# Patient Record
Sex: Male | Born: 1955 | Race: White | Hispanic: No | Smoking: Never smoker
Health system: Southern US, Community
[De-identification: ages and names within clinical notes are randomized; demographics above are authoritative.]

## PROBLEM LIST (undated history)

## (undated) DIAGNOSIS — G459 Transient cerebral ischemic attack, unspecified: Secondary | ICD-10-CM

## (undated) DIAGNOSIS — H269 Unspecified cataract: Secondary | ICD-10-CM

## (undated) DIAGNOSIS — G8929 Other chronic pain: Secondary | ICD-10-CM

## (undated) DIAGNOSIS — Q984 Klinefelter syndrome, unspecified: Secondary | ICD-10-CM

## (undated) DIAGNOSIS — T7840XA Allergy, unspecified, initial encounter: Secondary | ICD-10-CM

## (undated) DIAGNOSIS — R519 Headache, unspecified: Secondary | ICD-10-CM

## (undated) DIAGNOSIS — F410 Panic disorder [episodic paroxysmal anxiety] without agoraphobia: Secondary | ICD-10-CM

## (undated) DIAGNOSIS — K219 Gastro-esophageal reflux disease without esophagitis: Secondary | ICD-10-CM

## (undated) DIAGNOSIS — E785 Hyperlipidemia, unspecified: Secondary | ICD-10-CM

## (undated) DIAGNOSIS — J302 Other seasonal allergic rhinitis: Secondary | ICD-10-CM

## (undated) DIAGNOSIS — R51 Headache: Secondary | ICD-10-CM

## (undated) DIAGNOSIS — K200 Eosinophilic esophagitis: Secondary | ICD-10-CM

## (undated) DIAGNOSIS — D689 Coagulation defect, unspecified: Secondary | ICD-10-CM

## (undated) DIAGNOSIS — I2699 Other pulmonary embolism without acute cor pulmonale: Secondary | ICD-10-CM

## (undated) DIAGNOSIS — M47812 Spondylosis without myelopathy or radiculopathy, cervical region: Secondary | ICD-10-CM

## (undated) HISTORY — DX: Other pulmonary embolism without acute cor pulmonale: I26.99

## (undated) HISTORY — DX: Headache: R51

## (undated) HISTORY — DX: Transient cerebral ischemic attack, unspecified: G45.9

## (undated) HISTORY — DX: Eosinophilic esophagitis: K20.0

## (undated) HISTORY — DX: Other seasonal allergic rhinitis: J30.2

## (undated) HISTORY — PX: TONSILLECTOMY: SUR1361

## (undated) HISTORY — DX: Hyperlipidemia, unspecified: E78.5

## (undated) HISTORY — DX: Headache, unspecified: R51.9

## (undated) HISTORY — PX: CHOLECYSTECTOMY: SHX55

## (undated) HISTORY — PX: COLONOSCOPY: SHX174

## (undated) HISTORY — DX: Spondylosis without myelopathy or radiculopathy, cervical region: M47.812

## (undated) HISTORY — DX: Allergy, unspecified, initial encounter: T78.40XA

## (undated) HISTORY — PX: UPPER GASTROINTESTINAL ENDOSCOPY: SHX188

## (undated) HISTORY — PX: OTHER SURGICAL HISTORY: SHX169

## (undated) HISTORY — DX: Unspecified cataract: H26.9

## (undated) HISTORY — DX: Other chronic pain: G89.29

## (undated) HISTORY — DX: Coagulation defect, unspecified: D68.9

## (undated) HISTORY — PX: HAND TENDON SURGERY: SHX663

## (undated) HISTORY — PX: ERCP: SHX60

---

## 2002-02-13 ENCOUNTER — Encounter: Payer: Self-pay | Admitting: Internal Medicine

## 2002-02-13 ENCOUNTER — Emergency Department (HOSPITAL_COMMUNITY): Admission: EM | Admit: 2002-02-13 | Discharge: 2002-02-13 | Payer: Self-pay | Admitting: Internal Medicine

## 2002-09-26 ENCOUNTER — Encounter: Payer: Self-pay | Admitting: Internal Medicine

## 2007-07-08 ENCOUNTER — Ambulatory Visit: Payer: Self-pay | Admitting: Internal Medicine

## 2007-07-12 ENCOUNTER — Ambulatory Visit: Payer: Self-pay | Admitting: Internal Medicine

## 2008-05-22 ENCOUNTER — Ambulatory Visit: Payer: Self-pay | Admitting: Internal Medicine

## 2008-05-22 DIAGNOSIS — R1319 Other dysphagia: Secondary | ICD-10-CM | POA: Insufficient documentation

## 2008-05-30 ENCOUNTER — Ambulatory Visit: Payer: Self-pay | Admitting: Internal Medicine

## 2008-05-30 ENCOUNTER — Encounter: Payer: Self-pay | Admitting: Internal Medicine

## 2008-05-30 ENCOUNTER — Ambulatory Visit (HOSPITAL_COMMUNITY): Admission: RE | Admit: 2008-05-30 | Discharge: 2008-05-30 | Payer: Self-pay | Admitting: Internal Medicine

## 2008-06-01 ENCOUNTER — Emergency Department (HOSPITAL_COMMUNITY): Admission: EM | Admit: 2008-06-01 | Discharge: 2008-06-01 | Payer: Self-pay | Admitting: Emergency Medicine

## 2008-06-01 ENCOUNTER — Encounter: Payer: Self-pay | Admitting: Internal Medicine

## 2008-06-01 ENCOUNTER — Telehealth: Payer: Self-pay | Admitting: Internal Medicine

## 2008-06-02 ENCOUNTER — Encounter (INDEPENDENT_AMBULATORY_CARE_PROVIDER_SITE_OTHER): Payer: Self-pay

## 2008-06-02 ENCOUNTER — Telehealth (INDEPENDENT_AMBULATORY_CARE_PROVIDER_SITE_OTHER): Payer: Self-pay

## 2008-06-03 ENCOUNTER — Telehealth (INDEPENDENT_AMBULATORY_CARE_PROVIDER_SITE_OTHER): Payer: Self-pay

## 2008-06-03 DIAGNOSIS — K200 Eosinophilic esophagitis: Secondary | ICD-10-CM | POA: Insufficient documentation

## 2008-06-03 DIAGNOSIS — T7840XA Allergy, unspecified, initial encounter: Secondary | ICD-10-CM | POA: Insufficient documentation

## 2008-06-26 ENCOUNTER — Telehealth: Payer: Self-pay | Admitting: Internal Medicine

## 2008-08-12 ENCOUNTER — Ambulatory Visit: Payer: Self-pay | Admitting: Internal Medicine

## 2008-08-12 ENCOUNTER — Telehealth: Payer: Self-pay | Admitting: Internal Medicine

## 2008-08-12 DIAGNOSIS — K219 Gastro-esophageal reflux disease without esophagitis: Secondary | ICD-10-CM | POA: Insufficient documentation

## 2008-08-12 DIAGNOSIS — J4520 Mild intermittent asthma, uncomplicated: Secondary | ICD-10-CM | POA: Insufficient documentation

## 2008-08-18 LAB — CONVERTED CEMR LAB
IgE (Immunoglobulin E), Serum: 41.1 intl units/mL (ref 0.0–180.0)
Lymphocytes Relative: 30.7 % (ref 12.0–46.0)
Monocytes Relative: 7.4 % (ref 3.0–12.0)
Platelets: 225 10*3/uL (ref 150–400)
RDW: 12.5 % (ref 11.5–14.6)
WBC: 6.3 10*3/uL (ref 4.5–10.5)

## 2008-09-09 ENCOUNTER — Telehealth: Payer: Self-pay | Admitting: Internal Medicine

## 2008-10-16 ENCOUNTER — Emergency Department (HOSPITAL_COMMUNITY): Admission: EM | Admit: 2008-10-16 | Discharge: 2008-10-16 | Payer: Self-pay | Admitting: Emergency Medicine

## 2008-10-19 ENCOUNTER — Telehealth: Payer: Self-pay | Admitting: Internal Medicine

## 2008-10-22 ENCOUNTER — Telehealth: Payer: Self-pay | Admitting: Internal Medicine

## 2008-10-22 ENCOUNTER — Inpatient Hospital Stay (HOSPITAL_COMMUNITY): Admission: EM | Admit: 2008-10-22 | Discharge: 2008-10-28 | Payer: Self-pay | Admitting: Emergency Medicine

## 2008-10-22 ENCOUNTER — Ambulatory Visit: Payer: Self-pay | Admitting: Gastroenterology

## 2008-10-24 ENCOUNTER — Encounter (INDEPENDENT_AMBULATORY_CARE_PROVIDER_SITE_OTHER): Payer: Self-pay | Admitting: General Surgery

## 2008-10-27 ENCOUNTER — Encounter: Payer: Self-pay | Admitting: Gastroenterology

## 2008-11-16 ENCOUNTER — Encounter: Payer: Self-pay | Admitting: Internal Medicine

## 2008-11-30 ENCOUNTER — Encounter: Payer: Self-pay | Admitting: Internal Medicine

## 2008-12-14 ENCOUNTER — Ambulatory Visit: Payer: Self-pay | Admitting: Internal Medicine

## 2008-12-14 DIAGNOSIS — K8050 Calculus of bile duct without cholangitis or cholecystitis without obstruction: Secondary | ICD-10-CM | POA: Insufficient documentation

## 2008-12-14 DIAGNOSIS — R5383 Other fatigue: Secondary | ICD-10-CM

## 2008-12-14 DIAGNOSIS — R5381 Other malaise: Secondary | ICD-10-CM | POA: Insufficient documentation

## 2008-12-16 LAB — CONVERTED CEMR LAB
Basophils Relative: 3.7 % — ABNORMAL HIGH (ref 0.0–3.0)
CO2: 32 meq/L (ref 19–32)
Eosinophils Relative: 5.4 % — ABNORMAL HIGH (ref 0.0–5.0)
GFR calc non Af Amer: 83.37 mL/min (ref >60)
Glucose, Bld: 96 mg/dL (ref 70–99)
HCT: 42.3 % (ref 39.0–52.0)
Hemoglobin: 15.1 g/dL (ref 13.0–17.0)
Lymphs Abs: 2 10*3/uL (ref 0.7–4.0)
Monocytes Relative: 9 % (ref 3.0–12.0)
Neutro Abs: 3.5 10*3/uL (ref 1.4–7.7)
Platelets: 203 10*3/uL (ref 150.0–400.0)
RBC: 4.79 M/uL (ref 4.22–5.81)
Sodium: 142 meq/L (ref 135–145)
TSH: 2.07 microintl units/mL (ref 0.35–5.50)
Total Bilirubin: 1.3 mg/dL — ABNORMAL HIGH (ref 0.3–1.2)
Total Protein: 6.6 g/dL (ref 6.0–8.3)
WBC: 6.7 10*3/uL (ref 4.5–10.5)

## 2009-06-11 ENCOUNTER — Telehealth (INDEPENDENT_AMBULATORY_CARE_PROVIDER_SITE_OTHER): Payer: Self-pay | Admitting: *Deleted

## 2009-06-30 ENCOUNTER — Ambulatory Visit: Payer: Self-pay | Admitting: Internal Medicine

## 2009-08-05 ENCOUNTER — Telehealth: Payer: Self-pay | Admitting: Internal Medicine

## 2009-12-01 ENCOUNTER — Telehealth: Payer: Self-pay | Admitting: Internal Medicine

## 2010-06-23 ENCOUNTER — Encounter (INDEPENDENT_AMBULATORY_CARE_PROVIDER_SITE_OTHER): Payer: Self-pay | Admitting: *Deleted

## 2010-07-11 ENCOUNTER — Ambulatory Visit: Payer: Self-pay | Admitting: Internal Medicine

## 2010-10-18 NOTE — Progress Notes (Signed)
Summary: f/u appt?  Phone Note Call from Patient Call back at Work Phone [phone]   Caller: Patient Call For: Dr. Leone Payor Reason for Call: Talk to Nurse Summary of Call: pt would like to know if he needs to come in twice per year for f/u's... if not, he will wait until October of this year to come in for an annual f/u... pt states he is not having any problems or issues, this is just to check what Dr. Leone Payor thinks is best Initial call taken by: Vallarie Mare,  December 01, 2009 8:23 AM  Follow-up for Phone Call        1x/year visit with refills for 1 year (next visit due 10/11) are ok as long as wthout significant problems like dysphagia Follow-up by: Iva Boop MD, Clementeen Graham,  December 01, 2009 2:01 PM  Additional Follow-up for Phone Call Additional follow up Details #1::        notified pt OK to follow up in 10/11.  Refills sent to last until then.  11 refills previously sent but pt states that pharmacy told him he was out of refills.  Pt denies any dysphagia or reflux.  Pt will make appt in October. Additional Follow-up by: Francee Piccolo CMA Duncan Dull),  December 01, 2009 3:06 PM    Prescriptions: PROTONIX 40 MG TBEC (PANTOPRAZOLE SODIUM) 1 tablet by mouth once daily  #30.0 Each x 7   Entered by:   Francee Piccolo CMA (AAMA)   Authorized by:   Iva Boop MD, [facility]   Signed by:   Francee Piccolo CMA (AAMA) on 12/01/2009   Method used:   Electronically to        [facility]. [phone]* (retail)       [address]       [place], [place]  [address]       Ph: [phone]       Fax: [phone]   RxID:   [id number]

## 2010-10-18 NOTE — Assessment & Plan Note (Signed)
Summary: YEARLY FOLLOW UP/YF    History of Present Illness Visit Type: Follow-up Visit Primary GI MD: Stan Head MD [facility] Primary Provider: Robert Bellow, MD Requesting Provider: n/a Chief Complaint: GERD, eosinophilic esophagitis follow-up History of Present Illness:   55 yo Antonio [name] followed for GERD and eosinophilic esophagitis. Once every two months her complains of increased reflux that is severe and it last several hours, he relates it to a change in his diet. Patient has a groin fungus and is on cream two times a day  Reflux episodes triggered by spicy foods.    GI Review of Systems      Denies abdominal pain, acid reflux, belching, bloating, chest pain, dysphagia with liquids, dysphagia with solids, heartburn, loss of appetite, nausea, vomiting, vomiting blood, weight loss, and  weight gain.        Denies anal fissure, black tarry stools, change in bowel habit, constipation, diarrhea, diverticulosis, fecal incontinence, heme positive stool, hemorrhoids, irritable bowel syndrome, jaundice, light color stool, liver problems, rectal bleeding, and  rectal pain.    Current Medications (verified): 1)  Multivitamins  Tabs (Multiple Vitamin) .... Take 1 Tablet By Mouth Once A Day 2)  Testosterone Injections .... Im Q 3 Wks 3)  Proair Hfa 108 (90 Base) Mcg/act Aers (Albuterol Sulfate) .Marland Kitchen.. 1 Puff As Needed 4)  Protonix 40 Mg Tbec (Pantoprazole Sodium) .Marland Kitchen.. 1 Tablet By Mouth Once Daily 5)  Singulair 10 Mg Tabs (Montelukast Sodium) .Marland Kitchen.. 1 Tablet By Mouth Once Daily 6)  Aspirin 81 Mg Tbec (Aspirin) .... Take One By Mouth Once Daily 7)  Advil Cold and Sinus .... Take One As Needed 8)  Clotimazle/betamethasone Cream .... Apply Two Times A Day To Groin  Allergies: 1)  ! Codeine 2)  ! 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 tonsillectomy cataract surgery both eyes Cholecystectomy/ERCP for cbd stones  Family History: No FH of Colon Cancer allergy- mother and sister Family History of Irritable Bowel Syndrome: sister  Social History: Married Patient has never smoked. Exposed to 2nd hand smoke in the home. 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  Vital Signs:  Patient profile:   55 year old male Height:      Antonio inches Weight:      165.8 pounds BMI:     23.88 Pulse rate:   80 / minute Pulse rhythm:   regular BP sitting:   118 / 60  (left arm) Cuff size:   regular  Vitals Entered By: Harlow Mares CMA Duncan Dull) (July 11, 2010 9:05 AM)  Physical Exam  General:  Well developed, well nourished, no acute distress.   Impression & Recommendations:  Problem # 1:  EOSINOPHILIC ESOPHAGITIS (ICD-530.13) Assessment Unchanged Doing well on PPI and Singulair. No dysphagia. Will continue this tx. If ok can get refills up to 2 years without office visit.  Problem # 2:  GERD (ICD-530.81) Assessment: Unchanged Doing well. Continue PPI. Refills up to 2 years if ok via phone.  Patient Instructions: 1)  Use antacids like Tums, Rolaids, or Mylanta if you eat spicy foods. 2)  Please schedule a follow-up appointment in 1 year-2 years. May have Protonix refills if doing well up to 2 years (without  an office visit). 3)  Please schedule a follow-up appointment as needed sooner if you have new or worsening symptoms. 4)  Copy sent to : Robert Bellow, MD 5)  The medication list was reviewed and reconciled.  All changed / newly prescribed medications were explained.  A complete medication list was provided to the patient / caregiver. Prescriptions: PROTONIX 40 MG TBEC  (PANTOPRAZOLE SODIUM) 1 tablet by mouth once daily  #30.0 Each x 11   Entered and Authorized by:   Iva Boop MD, [facility]   Signed by:   Iva Boop MD, FACG on 07/11/2010   Method used:   Electronically to        [facility]. [phone]* (retail)       [address]       [place], [place]  [address]       Ph: [phone]       Fax: [phone]   RxID:   [id number]

## 2010-10-18 NOTE — Letter (Signed)
Summary: Office Visit Letter  [facility]  [address] [place], [place] [address]   Phone: [phone]  Fax: [phone]      June 23, 2010 MRN: [id number]   [facility] [address] [place], [place]  [address]   Dear Mr. Grell,   According to our records, it is time for you to schedule a follow-up office visit with [place].   At your convenience, please call [phone] (option #2)to schedule an office visit. If you have any questions, concerns, or feel that this letter is in error, we would appreciate your call.   Sincerely,    Iva Boop, M.D.  [facility] [phone]

## 2011-01-02 LAB — DIFFERENTIAL
Basophils Absolute: 0 10*3/uL (ref 0.0–0.1)
Basophils Relative: 0 % (ref 0–1)
Lymphocytes Relative: 7 % — ABNORMAL LOW (ref 12–46)
Neutro Abs: 8.8 10*3/uL — ABNORMAL HIGH (ref 1.7–7.7)

## 2011-01-02 LAB — URINALYSIS, ROUTINE W REFLEX MICROSCOPIC
Bilirubin Urine: NEGATIVE
Ketones, ur: 40 mg/dL — AB
Nitrite: NEGATIVE
Specific Gravity, Urine: 1.015 (ref 1.005–1.030)
Urobilinogen, UA: 0.2 mg/dL (ref 0.0–1.0)

## 2011-01-02 LAB — LIPASE, BLOOD: Lipase: 22 U/L (ref 11–59)

## 2011-01-02 LAB — COMPREHENSIVE METABOLIC PANEL
BUN: 12 mg/dL (ref 6–23)
CO2: 26 mEq/L (ref 19–32)
Chloride: 101 mEq/L (ref 96–112)
Creatinine, Ser: 1.15 mg/dL (ref 0.4–1.5)
GFR calc non Af Amer: >60 mL/min (ref >60)
Glucose, Bld: 182 mg/dL — ABNORMAL HIGH (ref 70–99)
Total Bilirubin: 1.2 mg/dL (ref 0.3–1.2)

## 2011-01-02 LAB — CBC
HCT: 46.4 % (ref 39.0–52.0)
Hemoglobin: 16.3 g/dL (ref 13.0–17.0)
MCV: 87.6 fL (ref 78.0–100.0)
RBC: 5.3 MIL/uL (ref 4.22–5.81)
WBC: 10 10*3/uL (ref 4.0–10.5)

## 2011-01-02 LAB — URINE CULTURE

## 2011-01-02 LAB — RAPID URINE DRUG SCREEN, HOSP PERFORMED
Cocaine: NOT DETECTED
Opiates: NOT DETECTED

## 2011-01-03 LAB — COMPREHENSIVE METABOLIC PANEL
ALT: 241 U/L — ABNORMAL HIGH (ref 0–53)
ALT: 299 U/L — ABNORMAL HIGH (ref 0–53)
AST: 190 U/L — ABNORMAL HIGH (ref 0–37)
AST: 57 U/L — ABNORMAL HIGH (ref 0–37)
Albumin: 3.1 g/dL — ABNORMAL LOW (ref 3.5–5.2)
Alkaline Phosphatase: 204 U/L — ABNORMAL HIGH (ref 39–117)
Alkaline Phosphatase: 286 U/L — ABNORMAL HIGH (ref 39–117)
Alkaline Phosphatase: 320 U/L — ABNORMAL HIGH (ref 39–117)
BUN: 11 mg/dL (ref 6–23)
CO2: 27 mEq/L (ref 19–32)
CO2: 30 mEq/L (ref 19–32)
CO2: 32 mEq/L (ref 19–32)
Calcium: 8.5 mg/dL (ref 8.4–10.5)
Chloride: 100 mEq/L (ref 96–112)
Chloride: 100 mEq/L (ref 96–112)
Chloride: 101 mEq/L (ref 96–112)
Creatinine, Ser: 1.19 mg/dL (ref 0.4–1.5)
GFR calc Af Amer: >60 mL/min (ref >60)
GFR calc Af Amer: >60 mL/min (ref >60)
GFR calc non Af Amer: >60 mL/min (ref >60)
GFR calc non Af Amer: >60 mL/min (ref >60)
GFR calc non Af Amer: >60 mL/min (ref >60)
Glucose, Bld: 105 mg/dL — ABNORMAL HIGH (ref 70–99)
Glucose, Bld: 133 mg/dL — ABNORMAL HIGH (ref 70–99)
Potassium: 3.9 mEq/L (ref 3.5–5.1)
Potassium: 4 mEq/L (ref 3.5–5.1)
Potassium: 4.7 mEq/L (ref 3.5–5.1)
Sodium: 136 mEq/L (ref 135–145)
Sodium: 137 mEq/L (ref 135–145)
Total Bilirubin: 2.6 mg/dL — ABNORMAL HIGH (ref 0.3–1.2)
Total Bilirubin: 3.2 mg/dL — ABNORMAL HIGH (ref 0.3–1.2)
Total Protein: 6.4 g/dL (ref 6.0–8.3)

## 2011-01-03 LAB — CBC
HCT: 39.3 % (ref 39.0–52.0)
HCT: 39.7 % (ref 39.0–52.0)
HCT: 40.2 % (ref 39.0–52.0)
HCT: 45.7 % (ref 39.0–52.0)
Hemoglobin: 13.1 g/dL (ref 13.0–17.0)
Hemoglobin: 13.8 g/dL (ref 13.0–17.0)
Hemoglobin: 15.7 g/dL (ref 13.0–17.0)
MCHC: 34.5 g/dL (ref 30.0–36.0)
MCHC: 34.6 g/dL (ref 30.0–36.0)
MCV: 89.2 fL (ref 78.0–100.0)
MCV: 89.7 fL (ref 78.0–100.0)
MCV: 89.7 fL (ref 78.0–100.0)
Platelets: 308 10*3/uL (ref 150–400)
Platelets: 378 10*3/uL (ref 150–400)
Platelets: 383 10*3/uL (ref 150–400)
Platelets: 444 10*3/uL — ABNORMAL HIGH (ref 150–400)
RBC: 4.16 MIL/uL — ABNORMAL LOW (ref 4.22–5.81)
RBC: 4.5 MIL/uL (ref 4.22–5.81)
RBC: 4.52 MIL/uL (ref 4.22–5.81)
RDW: 13.3 % (ref 11.5–15.5)
RDW: 13.6 % (ref 11.5–15.5)
RDW: 13.7 % (ref 11.5–15.5)
WBC: 6 10*3/uL (ref 4.0–10.5)
WBC: 6.6 10*3/uL (ref 4.0–10.5)
WBC: 7.2 10*3/uL (ref 4.0–10.5)
WBC: 7.5 10*3/uL (ref 4.0–10.5)
WBC: 8.1 10*3/uL (ref 4.0–10.5)

## 2011-01-03 LAB — LIPASE, BLOOD
Lipase: 22 U/L (ref 11–59)
Lipase: 29 U/L (ref 11–59)

## 2011-01-03 LAB — HEPATIC FUNCTION PANEL
ALT: 260 U/L — ABNORMAL HIGH (ref 0–53)
ALT: 597 U/L — ABNORMAL HIGH (ref 0–53)
AST: 77 U/L — ABNORMAL HIGH (ref 0–37)
AST: 91 U/L — ABNORMAL HIGH (ref 0–37)
AST: 98 U/L — ABNORMAL HIGH (ref 0–37)
Albumin: 2.6 g/dL — ABNORMAL LOW (ref 3.5–5.2)
Albumin: 2.7 g/dL — ABNORMAL LOW (ref 3.5–5.2)
Alkaline Phosphatase: 302 U/L — ABNORMAL HIGH (ref 39–117)
Bilirubin, Direct: 0.4 mg/dL — ABNORMAL HIGH (ref 0.0–0.3)
Bilirubin, Direct: 0.6 mg/dL — ABNORMAL HIGH (ref 0.0–0.3)
Indirect Bilirubin: 0.7 mg/dL (ref 0.3–0.9)
Indirect Bilirubin: 1.5 mg/dL — ABNORMAL HIGH (ref 0.3–0.9)
Indirect Bilirubin: 2.4 mg/dL — ABNORMAL HIGH (ref 0.3–0.9)
Total Bilirubin: 2.1 mg/dL — ABNORMAL HIGH (ref 0.3–1.2)
Total Protein: 6.3 g/dL (ref 6.0–8.3)
Total Protein: 6.6 g/dL (ref 6.0–8.3)

## 2011-01-03 LAB — DIFFERENTIAL
Basophils Absolute: 0 10*3/uL (ref 0.0–0.1)
Basophils Relative: 0 % (ref 0–1)
Eosinophils Absolute: 0.1 10*3/uL (ref 0.0–0.7)
Neutro Abs: 8.9 10*3/uL — ABNORMAL HIGH (ref 1.7–7.7)
Neutrophils Relative %: 83 % — ABNORMAL HIGH (ref 43–77)

## 2011-01-03 LAB — SEDIMENTATION RATE: Sed Rate: 57 mm/hr — ABNORMAL HIGH (ref 0–16)

## 2011-01-03 LAB — BASIC METABOLIC PANEL
BUN: 9 mg/dL (ref 6–23)
Calcium: 8.7 mg/dL (ref 8.4–10.5)
Chloride: 104 mEq/L (ref 96–112)
Creatinine, Ser: 1.05 mg/dL (ref 0.4–1.5)
GFR calc Af Amer: >60 mL/min (ref >60)

## 2011-01-03 LAB — URINALYSIS, ROUTINE W REFLEX MICROSCOPIC
Glucose, UA: NEGATIVE mg/dL
Ketones, ur: NEGATIVE mg/dL
Specific Gravity, Urine: 1.013 (ref 1.005–1.030)
pH: 6.5 (ref 5.0–8.0)

## 2011-01-31 NOTE — Op Note (Signed)
Antonio [name], Antonio [name] NO.:  [id number]   MEDICAL RECORD NO.:  [id number]          PATIENT TYPE:  INP   LOCATION:  1344                         FACILITY:  [facility]   PHYSICIAN:  Lennie Muckle, MD      DATE OF BIRTH:  12-15-1955   DATE OF PROCEDURE:  10/24/2008  DATE OF DISCHARGE:                               OPERATIVE REPORT   PREOPERATIVE DIAGNOSES:  1. Cholecystitis.  2. Cholelithiasis.   POSTOPERATIVE DIAGNOSES:  1. Cholecystitis with necrotic gallbladder.  2. Choledocholithiasis.   PROCEDURE:  Laparoscopic cholecystectomy with cholangiogram.   SURGEON:  Amber L. Freida Busman, M.D.   ASSISTANT:  Alfonse Ras, M.D.   General tracheal anesthesia.   FINDINGS:  Necrotic gallbladder.  A friable cystic duct cholangiogram  revealed no flow into the common bile duct but proximally into the right  and left hepatic ducts.   SPECIMEN:  Gallbladder to pathology.   BLOOD LOSS:  Approximately 25 mL.   A 19 Blake drain was placed.  No immediate complications.   INDICATIONS FOR PROCEDURE:  Antonio [name] is a 55 year old male whom I had  seen for consultation of abdominal pain and cholelithiasis.  Question  cholecystitis.  He had severe amounts of pain in the past week, was  admitted with pericholecystic fluid noted around the gallbladder.  Preoperative labs did reveal an elevation in his liver enzymes with a  bilirubin of 4.2, today his bilirubin was 2.5.  I discussed with the  patient performing a cholecystectomy and cholangiogram.  Possible open  procedure.   DETAILS OF PROCEDURE:  Antonio [name] was identified in the preoperative  holding area.  He had already received Cipro and Zosyn preoperatively.  He is taken to the operating room, placed in the supine position.  After  administration of general endotracheal anesthesia his abdomen was  prepped and draped in the usual sterile fashion.  A time-out procedure  indicating patient and procedure was performed.  I placed  an incision at  the umbilicus.  Fascia was grasped with a Kocher.  A Veress needle  introduced in the abdominal cavity.  I placed a 10-mm trocar using the  Optiview into the abdominal cavity.  All layers of abdominal wall were  visualized upon entry.  I inspected the abdomen, there was no evidence  of injury upon placement of Veress or the trocar.  I then placed patient  head up, right side up.  A 5-mm trocar was placed in the epigastric  region.  Two additional 5-mm trocars were placed in the right side of  the abdomen under visualization with the camera.  Appeared to be  adhesive tissue around the liver and the area of the gallbladder.  With  blunt dissection, we peeled away the omentum, there was a purulent  amount of fluid in this vicinity.  The gallbladder appeared necrotic.  We were able to grasp the fundus of the gallbladder and tracked it to  the head of the patient, continued bluntly dissecting the omentum away  from the gallbladder.  I grasped the infundibulum away from the liver  bed.  Using blunt dissection, I began dissecting the omentum at the  infundibulum.  The cystic duct was very friable and did tear right at  the infundibulum.  I was able to carefully dissect around the cystic  duct with Maryland forceps.  I was able to place a clip proximally and  placed a cholangiogram catheter into the cystic duct which was partially  transected.  The cholangiogram was performed, flow did go into the  common duct but did not go into the duodenum.  There was proximal flow  into the right and left hepatic ducts, some of the contrast did  extravasate out.  At that time, I then was able to place a clip  proximally on the cystic duct and was able to successfully place a total  of 3 clips on the very friable cystic duct.  I then continued dissecting  out the gallbladder.  The cystic artery was identified and clipped and  divided.  The gallbladder was easily pulled away from the liver bed  due  to the necrotic and thin wall.  This was able to be removed.  I placed  the gallbladder in the EndoCatch bag and removed it from the umbilical  incision.  The abdomen was then irrigated with 2.5 liters of saline.  There appeared to be no further evidence of purulent fluid.  I placed  Surgicel on the liver bed.  There was no evidence of bleeding in the  liver bed.  I then placed a 19-Blake drain in the gallbladder fossa.  I  closed the fascial defect at the umbilicus with a 0 Vicryl suture.  Pneumoperitoneum was released, the drain was secured and skin was closed  with 4-0 Monocryl.  Dermabond was placed final dressing.  At that time,  Dr. Colin Benton was able to discuss with Dr. Virginia Rochester, who is gastroenterology,  findings of an obstruction.  No attempt to perform an ERCP today.  I am  concerned about the friability of the cystic duct and feel that he would  be at a high risk for injury of trying to do a common duct exploration.      Lennie Muckle, MD  Electronically Signed     ALA/MEDQ  D:  10/24/2008  T:  10/25/2008  Job:  [id number]

## 2011-01-31 NOTE — H&P (Signed)
[name], [name] NO.:  [name]   MEDICAL RECORD NO.:  [id number]          PATIENT TYPE:  INP   LOCATION:  1344                         [facility]   PHYSICIAN:  Rachael Fee, MD   DATE OF BIRTH:  Jan 19, 1956   DATE OF ADMISSION:  10/22/2008  DATE OF DISCHARGE:                              HISTORY & PHYSICAL   PROBLEM:  Acute abdominal pain.   HISTORY OF PRESENT ILLNESS:  Mr. Antonio [name] is a 55 year old white male  known to Antonio [name] who has a diagnosis of eosinophilic esophagitis  which was made in September 2009.  He has been treated with Flovent.  He  did have biopsies of the esophagus confirming eosinophilic esophagitis  and also had increased eosinophils on gastric biopsies.  The patient has  also had a barium swallow showing a diffusely small caliber of the  esophagus.  He has not had any prior abdominal surgery or other GI  issues.  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  He states at  this time he had onset of his current symptoms about 10 days ago with  some episodes of increased acid at night with abdominal burning and  discomfort but no vomiting.  He says two days after he started with  those episodes.  He had fairly severe mid upper abdominal pain  associated with nausea and dry heaves.  He had one episode which lasted  for several hours, was fairly intense, and he presented to the emergency  room at [name] on October 16, 2008.  He had labs done at that time  showing a WBC of 10, hemoglobin 16.3, hematocrit of 46.4, potassium 3.4.  Hepatic panel was normal.  Lipase was 22, CT scan of the abdomen and  pelvis was done which did show evidence of gallstones.  Also, some  increased caliber of small bowel loops, question low grade obstruction  versus ileus.  He was discharged being told that he may have been  constipated, and was told to take MiraLax and Dulcolax.  A snow storm  had started, so he went home but did not get the  MiraLax and wound up  taking Ex-Lax all weekend.  He said he did have several bowel movements,  but his pain continued and was somewhat constant, like I swallowed a  gallon of acid.  His appetite has been decreased and he has been eating  well.  He finally was able to get MiraLax and Dulcolax on February 1.  Took this, had multiple bowel movements, nonbloody, but says that he has  not really felt well all week.  Today, after eating an instant  breakfast, his pain abruptly increased, was associated with nausea and  dry heaves again with intense pain like that initial episode, and he  presented to the emergency room here.  He was seen and evaluated and  admitted for pain control and further diagnostic evaluation.  Initial  concerns with his underlying eosinophilic esophagitis is that of a  possible eosinophilic enteritis versus other intra-abdominal  inflammatory process.   PAST HISTORY:  1. Klinefelter syndrome.  2. Asthma.  3. Eosinophilic esophagitis.  4. GERD.  5. He has had bilateral cataracts.  6. No prior abdominal surgeries.   MEDICATIONS:  1. Protonix 40 mg daily.  2. Aspirin 81 mg daily.  3. Multivitamin daily.  4. Testosterone injections IM q. 3 weeks.  5. ProAir one puff as needed.  He has finished the Flovent currently.  6. Singulair 10 mg daily.   ALLERGIES:  CODEINE.   FAMILY HISTORY:  Father deceased when the patient was [age] from drowning,  paternal grandfather with Parkinson's.  He has a sister with IBS.   SOCIAL HISTORY:  He is married, does not have any children.  No tobacco,  no EtOH.  He is employed with [employer].  Prior endoscopic evaluation, in  addition to the endoscopy as described above, he had colonoscopy in  October 2008 which was a normal exam per [name].   REVIEW OF SYSTEMS:  CARDIOVASCULAR:  Negative for chest pain or anginal  symptoms.  PULMONARY:  He has noted some increased shortness of breath  and rapid breathing recently which he does  associate with pain.  No  wheezing or sputum production or cough.  GI:  As outlined above.  GENITOURINARY:  Negative.  MUSCULOSKELETAL:  Negative.  NEUROLOGICAL/PSYCH:  Negative.  All other review of systems negative.   PHYSICAL EXAMINATION:  GENERAL:  Well-developed white male who is  uncomfortable in no acute distress in the emergency room.  VITAL SIGNS:  Temperature 98.8, blood pressure 104/62, pulse is 83, sats  97.  HEENT:  Nontraumatic, normocephalic.  EOMI, PERRLA.  Sclerae anicteric.  NECK:  Supple.  Oral mucosa is dry.  No adenopathy.  LUNGS:  Clear to A and P.  CARDIOVASCULAR:  Regular rate and rhythm with S1-S2.  ABDOMEN:  Slightly distended.  Bowel sounds somewhat hyperactive.  He is  diffusely tender but more focal in the right lower quadrant.  No  palpable mass or hepatosplenomegaly.  RECTAL:  Exam not done at this time.  EXTREMITIES:  Without clubbing, cyanosis or edema.  NEUROLOGICAL:  The patient is alert and oriented x3.  Exam is otherwise  nonfocal.   LABORATORY DATA:  Pending on admission.  Abdominal films in the  emergency room show ileus versus partial small-bowel obstruction.   IMPRESSION:  39. A 55 year old white male with 10-day history of mid abdominal pain      and nausea with x-ray findings of partial small-bowel obstruction.      Etiology is not clear as the patient has not had any prior      abdominal surgeries.  Rule out autoimmune process, eosinophilic      enteritis or other inflammatory process.  2. History of eosinophilic esophagitis.  3. Klinefelter syndrome.  4. Asthma.   PLAN:  The patient is admitted to the service Dr. Christella [name] for IV fluid  hydration, bowel rest, pain control, baseline labs which are pending at  this time and will obtain CT enterography for further detailing of the  small bowel.  For further details, please see the orders.      Antonio Gip, PA-C      Rachael Fee, MD  Electronically Signed    AE/MEDQ  D:   10/23/2008  T:  10/23/2008  Job:  [id number]

## 2011-01-31 NOTE — Consult Note (Signed)
NAMEZYIRE, EIDSON NO.:  [id number]   MEDICAL RECORD NO.:  [id number]          PATIENT TYPE:  INP   LOCATION:  1344                         [facility]AC[facility]   PHYSICIAN:  Lennie Muckle, MD      DATE OF BIRTH:  [birth year]   DATE OF CONSULTATION:  10/23/2008  DATE OF DISCHARGE:                                 CONSULTATION   REASON FOR CONSULTATION:  Cholelithiasis, question cholecystitis.   HISTORY OF PRESENT ILLNESS:  Mr. Hillery is a pleasant 55 year old male  who apparently had onset of pain last Friday.  He said the pain was  located over his general abdomen.  He did have associated nausea and  vomiting.  He had a previous episode of eosinophilic esophagitis.  He  had some increased acid reflux symptoms with that.  He felt this might  have been associated with his esophagitis.  The pain became so severe he  went to the emergency department.  A CT scan revealed no significant  abnormalities.  He was told he was constipated, given MiraLax and sent  home.  He then had several above episodes of abdominal pain during the  past week.  His last episode was to begin on the 4th.  He said he had  Instant Breakfast and then had increase in abdominal pain.  This was  located at approximately the epigastric region.  He once again had  associated nausea and vomiting.  He did have a repeat CT scan which  showed no significant abnormalities.  He did have gallstones seen.  An  ultrasound was performed for follow-up of possible cholecystitis.  This  revealed some small amount of pericholecystic fluid. Wall thickness was  normal at 3.1. Common bile duct was normal.  No Murphy's sign was  elicited.  He also had an elevation of his white count on admission of  10.8.  He was also noted to have elevation of his liver enzymes on  admission,  alkaline phosphatase 286, AST 362, ALT of 299. Today they  were ALT of 598, AST of 528, alkaline phosphatase 302, and bilirubin was  4.5. This  was increased from 3.2. He states today his pain is resolved.  He is no longer having nausea and vomiting.  He has not had any fevers  or chills.   PAST MEDICAL HISTORY:  Significant for Klinefelter syndrome, asthma,  eosinophilic esophagitis, reflux disease.   SURGICAL HISTORY:  Only cataract surgery.   Medications at home include Protonix 40 mg daily, aspirin,  multivitamins, testosterone injection, ProAir, and Singulair.   ALLERGIES:  CODEINE is an allergy.   FAMILY HISTORY:  His paternal grandfather had Parkinson's. One sister  with irritable bowel syndrome.   SOCIAL HISTORY:  He is married.  No tobacco or alcohol use.  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   REVIEW OF SYSTEMS:  Occasional shortness of breath, no sputum.   PHYSICAL EXAMINATION:  GENERAL APPEARANCE:  He is well-developed, well-  nourished male, lying in bed in no acute distress.  VITAL SIGNS:  Temperature is 97.5, blood pressure  144/70, pulse is 80, 96% on room  air.  HEENT: Head is normocephalic.  No scleral icterus is evident.  CHEST: Clear to auscultation bilaterally.  CARDIOVASCULAR:  Regular rate and rhythm.  ABDOMEN:  Mildly distended, nontender, no peritoneal signs are listed.  SKIN:  No jaundice is seen.  MUSCULOSKELETAL:  No deformities or edema.   ASSESSMENT/PLAN:  Cholelithiasis, question of cholecystitis, possibly  Mirizzi syndrome or choledocholithiasis.  I discussed with the patient  laparoscopic cholecystectomy and possible cholangiogram.  He did have  lunch today. Therefore, unable to perform the procedure today.  He is  clinically improving. Therefore, I do not think it is an emergent  procedure.  I have discussed with Mr. Closser I felt his best course of  treatment would be to repeat his liver enzymes for the morning.  If they  are increased further, I think he might benefit from ERCP.  If the ERCP  reveals an obstruction I think that he would be best served by having  this  relieved preoperatively.  If this is unsuccessful then I can always  plan for common bile duct exploration.  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  If he does improve over the  next 24 hours and continues to have no pain, he might be able to be  discharged home on antibiotics and do this on an outpatient basis.  I  did go over the procedure of a cholecystectomy, which included possibly  common bile duct injury, bleeding, infection, and conversion to an open  procedure.  All questions were answered today, and I will wait on his  lab results before making further recommendations. This was discussed  with Dr. Christella Hartigan as well.      Lennie Muckle, MD  Electronically Signed     ALA/MEDQ  D:  10/23/2008  T:  10/23/2008  Job:  [id number]

## 2011-01-31 NOTE — Discharge Summary (Signed)
NAMEGRECO, Antonio [name] NO.:  [id number]   MEDICAL RECORD NO.:  [id number]          PATIENT TYPE:  INP   LOCATION:  1344                         FACILITY:  [facility]   PHYSICIAN:  Lennie Muckle, MD      DATE OF BIRTH:  07/18/1956   DATE OF ADMISSION:  10/22/2008  DATE OF DISCHARGE:  10/28/2008                               DISCHARGE SUMMARY   FINAL DIAGNOSES:  1. Cholecystitis.  2. Choledocholithiasis.  3. Eosinophilic esophagitis, resolved.   PROCEDURES:  1. October 24, 2008 - laparoscopic cholecystectomy with cholangiogram.  2. October 27, 2008 - ERCP.  Successful retrieval of his stone.   HOSPITAL COURSE:  Antonio [name] is a pleasant 55 year old male who was  admitted to the GI service, Dr. Christella Hartigan, due to onset of abdominal pain.  It was felt he might have had a possible relapse of his esophageal  esophagitis.  During his work up, he did receive a CT scan which  revealed a thickened gallbladder wall.  The ultrasound follow up did  reveal pericholecystic fluid.  Examination was consistent with acute  cholecystitis.  He did have a rise in his liver enzymes during this  hospitalization with a bilirubin which was as high as 4.5 on October 23, 2008.  Liver enzymes were also elevated in the 500s.  After performing  the cholecystectomy, cholangiogram did reveal obstruction distally.  The  liver enzymes did trend down postoperatively to 1.1 on October 27, 2008;  however MRCP performed revealed a stone in the common bile duct.  After  discussion with the GI service, Dr. Claudette Head was able to  successfully perform an ERCP on October 27, 2008.  He was able to  retrieve small stones from the common bile duct.  The patient has done  well during the entire hospitalization.  He only had mild episodes of  abdominal pain, no nausea or vomiting.  I did place a JP at the time of  surgery which is in the gallbladder fossa area.  It has only put out a  minimal amount of fluid.   It does not appear to the bile stained.  He is  instructed to perform routine JP care, empty this daily.  Will follow up  Monday for removal of his JP.   He has been given Vicodin for pain.  He is to resume all of his home  medications which include Protonix, Singulair, aspirin, multivitamins,  testosterone injections and ProAir HFA.  He is to follow up with Dr.  Leone Payor on November 23, 2008 at 2:45 and will follow with my nurse Monday for removal of his JP.  He can shower daily.  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  He will call if he develops any fever,  chills, nausea or vomiting or increased abdominal pain.      Lennie Muckle, MD  Electronically Signed     ALA/MEDQ  D:  10/28/2008  T:  10/28/2008  Job:  [id number]   cc:   Antonio Boop, MD,FACG  [facility]  [address] [place], [place] [address]

## 2011-04-11 ENCOUNTER — Telehealth: Payer: Self-pay | Admitting: Internal Medicine

## 2011-04-11 NOTE — Telephone Encounter (Signed)
Patient instructed to maintain an anti-reflux diet. Advised to avoid caffeine, mint, citrus foods/juices, tomatoes,  chocolate, NSAIDS/ASA products.  Instructed not to eat within 2 hours of exercise or bed, multiple small meals are better than 3 large meals.  Need to take PPI 30 minutes prior to 1st meal of the day.  He reports that his reflux is increased lately.  He is consuming large amounts of mint green tea, chocolate, and a 2 liter soda a day.  I have asked him to increase his protonix to BID for 7 days, he will call back if this and dietary modifications aren't helping.

## 2011-04-24 ENCOUNTER — Telehealth: Payer: Self-pay | Admitting: Gastroenterology

## 2011-04-24 NOTE — Telephone Encounter (Signed)
Pt aware.

## 2011-04-24 NOTE — Telephone Encounter (Signed)
Pt states that his well is contaminated. He drank from the garden hose a week ago Sunday and Tuesday he started having diarrhea. This lasted for about 5 days. Now the pt states the diarrhea is gone but he is now complaining that he does not have the same volume of urine and his lower back on both sides below his belt are sore. Pt wonders if he needs to have some labs checked for his kidneys. Pts PCP is Dr. Perrin Maltese. Dr. Leone Payor please advise.

## 2011-04-24 NOTE — Telephone Encounter (Signed)
Ask him to see PCP about this

## 2011-06-21 LAB — COMPREHENSIVE METABOLIC PANEL
BUN: 12
CO2: 25
Calcium: 9.5
Creatinine, Ser: 1.01
GFR calc non Af Amer: >60
Glucose, Bld: 93

## 2011-06-21 LAB — DIFFERENTIAL
Eosinophils Absolute: 0.6
Lymphocytes Relative: 26
Lymphs Abs: 2.1
Neutro Abs: 4.7
Neutrophils Relative %: 59

## 2011-06-21 LAB — CBC
Hemoglobin: 15.8
MCHC: 34.6
MCV: 90.4
RBC: 5.06
RDW: 12.7

## 2011-06-23 ENCOUNTER — Emergency Department (HOSPITAL_COMMUNITY)
Admission: EM | Admit: 2011-06-23 | Discharge: 2011-06-24 | Disposition: A | Discharge status: Home / Self Care | Payer: Worker's Compensation | Attending: Emergency Medicine | Admitting: Emergency Medicine

## 2011-06-23 ENCOUNTER — Emergency Department (HOSPITAL_COMMUNITY): Payer: Worker's Compensation

## 2011-06-23 DIAGNOSIS — S63509A Unspecified sprain of unspecified wrist, initial encounter: Secondary | ICD-10-CM | POA: Insufficient documentation

## 2011-06-23 DIAGNOSIS — J45909 Unspecified asthma, uncomplicated: Secondary | ICD-10-CM | POA: Insufficient documentation

## 2011-06-23 DIAGNOSIS — M25539 Pain in unspecified wrist: Secondary | ICD-10-CM | POA: Insufficient documentation

## 2011-06-23 DIAGNOSIS — K219 Gastro-esophageal reflux disease without esophagitis: Secondary | ICD-10-CM | POA: Insufficient documentation

## 2011-06-23 DIAGNOSIS — W19XXXA Unspecified fall, initial encounter: Secondary | ICD-10-CM | POA: Insufficient documentation

## 2011-08-03 ENCOUNTER — Encounter: Payer: Self-pay | Admitting: Internal Medicine

## 2011-08-11 ENCOUNTER — Other Ambulatory Visit: Payer: Self-pay | Admitting: Internal Medicine

## 2011-08-30 ENCOUNTER — Ambulatory Visit: Payer: BC Managed Care – PPO

## 2011-08-30 DIAGNOSIS — E236 Other disorders of pituitary gland: Secondary | ICD-10-CM

## 2011-09-14 ENCOUNTER — Telehealth: Payer: Self-pay | Admitting: Internal Medicine

## 2011-09-14 MED ORDER — PANTOPRAZOLE SODIUM 40 MG PO TBEC: 40.0000 mg | DELAYED_RELEASE_TABLET | Freq: Every day | ORAL | Status: DC

## 2011-09-14 NOTE — Telephone Encounter (Signed)
Pt can only have Jan 11 off from work to come in for [employer] refill.  I checked his last office note and per Dr Leone Payor he only needs to have an appt every 2 years for refills.  I have sent in a years worth of protonix to his pharmacy and he will call if he has any problems before then.

## 2011-09-16 ENCOUNTER — Ambulatory Visit (INDEPENDENT_AMBULATORY_CARE_PROVIDER_SITE_OTHER): Payer: BC Managed Care – PPO

## 2011-09-16 DIAGNOSIS — E291 Testicular hypofunction: Secondary | ICD-10-CM

## 2011-09-29 ENCOUNTER — Ambulatory Visit (INDEPENDENT_AMBULATORY_CARE_PROVIDER_SITE_OTHER): Payer: BC Managed Care – PPO

## 2011-09-29 DIAGNOSIS — E236 Other disorders of pituitary gland: Secondary | ICD-10-CM

## 2011-11-06 ENCOUNTER — Other Ambulatory Visit: Payer: Self-pay | Admitting: Family Medicine

## 2011-11-06 MED ORDER — TESTOSTERONE ENANTHATE 200 MG/ML IM SOLN: INTRAMUSCULAR | Status: DC

## 2011-11-11 ENCOUNTER — Ambulatory Visit: Payer: BC Managed Care – PPO | Admitting: Physician Assistant

## 2011-11-11 DIAGNOSIS — E291 Testicular hypofunction: Secondary | ICD-10-CM

## 2011-11-11 DIAGNOSIS — E236 Other disorders of pituitary gland: Secondary | ICD-10-CM

## 2011-11-11 MED ORDER — TESTOSTERONE CYPIONATE 200 MG/ML IM SOLN
150.0000 mg | INTRAMUSCULAR | Status: DC
  Administered 2011-11-11: 150 mg via INTRAMUSCULAR
  Administered 2011-12-02: 0.75 mg via INTRAMUSCULAR
  Administered 2011-12-23 – 2012-01-10 (×2): 150 mg via INTRAMUSCULAR

## 2011-11-11 NOTE — Progress Notes (Signed)
  Subjective:    Patient ID: Antonio [name], male    DOB: March 07, 1956, 56 y.o.   MRN: [id number]  HPI  Pt here for his testosterone injections.  Review of Systems     Objective:   Physical Exam        Assessment & Plan:

## 2011-11-14 ENCOUNTER — Emergency Department (HOSPITAL_COMMUNITY): Payer: Worker's Compensation

## 2011-11-14 ENCOUNTER — Emergency Department (HOSPITAL_COMMUNITY)
Admission: EM | Admit: 2011-11-14 | Discharge: 2011-11-14 | Disposition: A | Discharge status: Home / Self Care | Payer: Worker's Compensation | Attending: Emergency Medicine | Admitting: Emergency Medicine

## 2011-11-14 ENCOUNTER — Encounter (HOSPITAL_COMMUNITY): Payer: Self-pay | Admitting: *Deleted

## 2011-11-14 DIAGNOSIS — M542 Cervicalgia: Secondary | ICD-10-CM | POA: Insufficient documentation

## 2011-11-14 DIAGNOSIS — Y9269 Other specified industrial and construction area as the place of occurrence of the external cause: Secondary | ICD-10-CM | POA: Insufficient documentation

## 2011-11-14 DIAGNOSIS — W19XXXA Unspecified fall, initial encounter: Secondary | ICD-10-CM

## 2011-11-14 DIAGNOSIS — Y990 Civilian activity done for income or pay: Secondary | ICD-10-CM | POA: Insufficient documentation

## 2011-11-14 DIAGNOSIS — W010XXA Fall on same level from slipping, tripping and stumbling without subsequent striking against object, initial encounter: Secondary | ICD-10-CM | POA: Insufficient documentation

## 2011-11-14 DIAGNOSIS — IMO0002 Reserved for concepts with insufficient information to code with codable children: Secondary | ICD-10-CM | POA: Insufficient documentation

## 2011-11-14 DIAGNOSIS — M47812 Spondylosis without myelopathy or radiculopathy, cervical region: Secondary | ICD-10-CM | POA: Insufficient documentation

## 2011-11-14 DIAGNOSIS — S3992XA Unspecified injury of lower back, initial encounter: Secondary | ICD-10-CM

## 2011-11-14 DIAGNOSIS — S0990XA Unspecified injury of head, initial encounter: Secondary | ICD-10-CM | POA: Insufficient documentation

## 2011-11-14 DIAGNOSIS — R51 Headache: Secondary | ICD-10-CM | POA: Insufficient documentation

## 2011-11-14 HISTORY — DX: Klinefelter syndrome, unspecified: Q98.4

## 2011-11-14 HISTORY — DX: Gastro-esophageal reflux disease without esophagitis: K21.9

## 2011-11-14 MED ORDER — OXYCODONE-ACETAMINOPHEN 5-325 MG PO TABS: 1.0000 | ORAL_TABLET | ORAL | Status: AC | PRN

## 2011-11-14 MED ORDER — OXYCODONE-ACETAMINOPHEN 5-325 MG PO TABS
1.0000 | ORAL_TABLET | Freq: Once | ORAL | Status: AC
  Administered 2011-11-14: 1 via ORAL
  Filled 2011-11-14: qty 1

## 2011-11-14 NOTE — ED Provider Notes (Signed)
History     CSN: [id number]  Arrival date & time 11/14/11  1478   First MD Initiated Contact with Patient 11/14/11 0900      Chief Complaint  Patient presents with  . Fall    (Consider location/radiation/quality/duration/timing/severity/associated sxs/prior treatment) HPI Comments: Antonio [name] is a 56 y.o. Male who was at work today when he slipped on an oily substance landing on his lower back. He was able to get up and sit on a stool on his own, then laid back down because of low back pain. He presents by EMS fully immobilized. He feels like he hit his head on the ground, but did not lose consciousness. He has not had nausea, vomiting, weakness, or dizziness. He had no preceding symptoms. He ate normally today. He is convalescing from a left hand injury several months ago   Past Medical History  Diagnosis Date  . GERD (gastroesophageal reflux disease)   . Allergy history unknown     seasonal   . Asthma   . Klinefelter syndrome     Past Surgical History  Procedure Date  . Cholecystectomy     History reviewed. No pertinent family history.  History  Substance Use Topics  . Smoking status: Not on file  . Smokeless tobacco: Not on file  . Alcohol Use:       Review of Systems  All other systems reviewed and are negative.    Allergies  Codeine  Home Medications   Current Outpatient Rx  Name Route Sig Dispense Refill  . ALBUTEROL SULFATE HFA 108 (90 BASE) MCG/ACT IN AERS Inhalation Inhale 2 puffs into the lungs every 6 (six) hours as needed.    . ASPIRIN 81 MG PO TABS Oral Take 81 mg by mouth daily.    . IBUPROFEN 200 MG PO TABS Oral Take 600 mg by mouth every 6 (six) hours as needed. For pain    . LORATADINE 10 MG PO TABS Oral Take 10 mg by mouth daily.    Marland Kitchen MONTELUKAST SODIUM 10 MG PO TABS Oral Take 10 mg by mouth at bedtime.    . ADULT MULTIVITAMIN LIQUID CH Oral Take 5 mLs by mouth daily.    Marland Kitchen PANTOPRAZOLE SODIUM 40 MG PO TBEC Oral Take 1 tablet (40 mg  total) by mouth daily. 30 tablet 11  . TESTOSTERONE ENANTHATE 200 MG/ML IM OIL  Inject 0.75 mls IM every 2 weeks 5 mL 0  . OXYCODONE-ACETAMINOPHEN 5-325 MG PO TABS Oral Take 1 tablet by mouth every 4 (four) hours as needed for pain. 15 tablet 0    BP 110/53  Pulse 85  Temp(Src) 98.5 F (36.9 C) (Oral)  Resp 18  SpO2 98%  Physical Exam  Nursing note and vitals reviewed. Constitutional: He is oriented to person, place, and time. He appears well-developed and well-nourished.  HENT:  Head: Normocephalic and atraumatic.  Right Ear: External ear normal.  Left Ear: External ear normal.       Mild tenderness without swelling, mid occiput  Eyes: Conjunctivae and EOM are normal. Pupils are equal, round, and reactive to light.  Neck: Normal range of motion and phonation normal. Neck supple.  Cardiovascular: Normal rate, regular rhythm, normal heart sounds and intact distal pulses.   Pulmonary/Chest: Effort normal and breath sounds normal. He exhibits no bony tenderness.  Abdominal: Soft. Normal appearance. There is no tenderness.  Musculoskeletal: Normal range of motion.       Mild upper cervical tenderness over the spine. No  step-off cervical, thoracic, or lumbar spine. No lumbar spine tenderness. Mild, diffuse buttocks, tenderness, without swelling.  Neurological: He is alert and oriented to person, place, and time. He has normal strength. No cranial nerve deficit or sensory deficit. He exhibits normal muscle tone. Coordination normal.  Skin: Skin is warm, dry and intact.  Psychiatric: He has a normal mood and affect. His behavior is normal. Judgment and thought content normal.    ED Course  Procedures (including critical care time) Initial Assessment: Evaluated on that cord with cervical immobilization in place. Able to move legs and arms well. Well controlled, off the board. No lumbar tenderness. Mild upper cervical spine tenderness cardiac in place.  11:58 AM Reevaluation with update  and discussion. After initial assessment and treatment, an updated evaluation reveals he is comfortable; and wonders if he has had a concussion because his "speech is slow", ambulation trial, he walks normally. He is able squat without difficulty . [name] L   Percocet given for pain.  Labs Reviewed - No data to display Dg Lumbar Spine Complete  11/14/2011  *RADIOLOGY REPORT*  Clinical Data: Status post fall.  Back pain.  LUMBAR SPINE - COMPLETE 4+ VIEW  Comparison: CT abdomen and pelvis 10/16/2008.  Findings: Vertebral body height and alignment are maintained. There is some anterior endplate spurring and facet degenerative disease in the lower lumbar spine.  No pars interarticularis defect is identified.  Paraspinous structures are unremarkable.  IMPRESSION: No acute finding.  Original Report Authenticated By: Bernadene Bell. Maricela Curet, M.D.   Ct Head Wo Contrast  11/14/2011  *RADIOLOGY REPORT*  Clinical Data:  Status post fall with a blow to the back of the head.  Pain in the head and neck.  CT HEAD WITHOUT CONTRAST CT CERVICAL SPINE WITHOUT CONTRAST  Technique:  Multidetector CT imaging of the head and cervical spine was performed following the standard protocol without intravenous contrast.  Multiplanar CT image reconstructions of the cervical spine were also generated.  Comparison:   None  CT HEAD  Findings: The brain appears normal without evidence of acute infarction, hemorrhage, mass lesion, mass effect, midline shift or abnormal extra-axial fluid collection.  No hydrocephalus or pneumocephalus.  Tiny amount of fluid in the left mastoid air cells is noted.  The calvarium is intact.  IMPRESSION: No acute intracranial abnormality.  CT CERVICAL SPINE  Findings: There is no fracture or subluxation of the cervical spine.  Loss of disc space height with endplate spurring is seen at C6-7.  Lung apices are clear.  IMPRESSION:  1.  No acute finding. 2.  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  Original Report  Authenticated By: Bernadene Bell. Maricela Curet, M.D.   Ct Cervical Spine Wo Contrast  11/14/2011  *RADIOLOGY REPORT*  Clinical Data:  Status post fall with a blow to the back of the head.  Pain in the head and neck.  CT HEAD WITHOUT CONTRAST CT CERVICAL SPINE WITHOUT CONTRAST  Technique:  Multidetector CT imaging of the head and cervical spine was performed following the standard protocol without intravenous contrast.  Multiplanar CT image reconstructions of the cervical spine were also generated.  Comparison:   None  CT HEAD  Findings: The brain appears normal without evidence of acute infarction, hemorrhage, mass lesion, mass effect, midline shift or abnormal extra-axial fluid collection.  No hydrocephalus or pneumocephalus.  Tiny amount of fluid in the left mastoid air cells is noted.  The calvarium is intact.  IMPRESSION: No acute intracranial abnormality.  CT CERVICAL SPINE  Findings:  There is no fracture or subluxation of the cervical spine.  Loss of disc space height with endplate spurring is seen at C6-7.  Lung apices are clear.  IMPRESSION:  1.  No acute finding. 2.  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  Original Report Authenticated By: Bernadene Bell. D'ALESSIO, M.D.     1. Fall   2. Head injury   3. Degenerative joint disease of cervical spine   4. Back injury       MDM  Pt was treated  in ED with analgesic medications with improvement; labs and imaging reviewed and considered in diagnostic decision making. No apparent fracture. Doubt Internal injury. Possible mild concussion due to head injury. He is stable for discharge to a home setting with his wife.  Plan: Home Medications- Percocet;  Home Treatments-  ice and heat; Recommended follow up-  With his PCP and orthopedist.         Flint Melter, MD 11/14/11 1159

## 2011-11-14 NOTE — ED Notes (Addendum)
Per ems pt is from work ([employer]). Alert and oriented x4, non ambulatory bc on backboard. ems reports pt was at work and slipped on oil and fell backwards, landed on his lower back and then hit the back of his head on the ground. Denies LOC. Pupils equal and reactive, able to maintain conversation throughout transport. Pt does have hx of chronic low back pain. Pt has a left arm splint on from a fall in Oct 2012. Pt reports pain in lower back 8/10 and pain in neck and back of head 8/10. Reports taking 1 advil this morning for back pain.

## 2011-11-14 NOTE — ED Notes (Signed)
Pt alert and oriented x4. Respirations even and unlabored, bilateral symmetrical rise and fall of chest. Skin warm and dry. In no acute distress. Denies needs. md at bedside assessing pt. Pt is not off backboard. C collar remains on. Md asked pt if he wanted pain medication, pt denied.

## 2011-11-14 NOTE — ED Notes (Signed)
Pt discussed with rn his walking abilities and states that "he is not 100%". Pt debated whether he wanted to take pain medication. Decided that does want pain medication. Pt verbalized that he will have a ride home and will not drive himself.

## 2011-11-14 NOTE — ED Notes (Signed)
md at bedside. md reported that pt understands he is being discharged home. Pt is wife is on way to give pt a ride home.

## 2011-11-14 NOTE — ED Notes (Signed)
rn discussed with md pts ability to walk. md reports he will go reassess pt.

## 2011-11-14 NOTE — ED Notes (Signed)
UJW:[id number]<YN> Expected date:11/14/11<BR> Expected time: 8:38 AM<BR> Means of arrival:Ambulance<BR> Comments:<BR> fall

## 2011-11-14 NOTE — ED Notes (Addendum)
Pt ambulated in the hall. Pt was very verbal about his pain and walked with an usual gait. Pt was able to walk fully on his own throughout the hall. Staff were by his side if assistance was needed by pt. Primary pain in "his hips, more left than right". Pain 8/10. Pt now requests pain meds, such as a tylenol.    Pt is very focused on staying over night, rn unsure of reason. Pt states "if he cant walk then we cant send him home."

## 2011-11-14 NOTE — Discharge Instructions (Signed)
Head Injury, Adult A head injury happens when the head is hit really hard. A head injury may cause sleepiness, headache, throwing up (vomiting), and problems seeing. If the head injury is really bad, you may need to stay in the hospital. HOME CARE  Have someone with you for the first 24 hours. This person should wake you up every 1 hour to check on your condition.   Only drink water or clear fluid for the rest of the day. Then, go back to your regular diet.   Only take medicines as told by your doctor. Do not take aspirin.   Do not drink alcohol for 2 days.   Do not take medicines that help your relax (sedatives) for 2 days.  Side effects may happen for up to 7 to 10 days. Watch for new problems. GET HELP RIGHT AWAY IF:   You are confused or sleepy.   You cannot be woken up.   You feel sick to your stomach (nauseous) or keep throwing up.   Your dizziness or unsteadiness is get worse, or your cannot walk.   You start to shake (convulse) or pass out (faint).   You have very bad, lasting headaches that are not helped by medicine.   You cannot use your arms or legs like normal.   You have clear or bloody fluid coming from your nose or ears.  MAKE SURE YOU:   Understand these instructions.   Will watch your condition.   Will get help right away if you are not doing well or get worse.  Document Released: 08/17/2008 Document Revised: 05/17/2011 Document Reviewed: 07/21/2009 [facility] Patient Information 2012 [place], [place].Contusion A bruise (contusion) or hematoma is a collection of blood under skin causing an area of discoloration. It is caused by an injury to blood vessels beneath the injured area with a release of blood into that area. As blood accumulates it is known as a hematoma. This collection of blood causes a blue to dark blue color. As the injury improves over days to weeks it turns to a yellowish color and then usually disappears completely over the same period of time.  These generally resolve completely without problems. The hematoma rarely requires drainage. HOME CARE INSTRUCTIONS   Apply ice to the injured area for 15 to 20 minutes 3 to 4 times per day for the first 1 or 2 days.   Put the ice in a plastic bag and place a towel between the bag of ice and your skin. Discontinue the ice if it causes pain.   If bleeding is more than just a little, apply pressure to the area for at least thirty minutes to decrease the amount of bruising. Apply pressure and ice as your caregiver suggests.   If the injury is on an extremity, elevation of that part may help to decrease pain and swelling. Wrapping with an ace or supportive wrap may also be helpful. If the bruise is on a lower extremity and is painful, crutches may be helpful for a couple days.   If you have been given a tetanus shot because the skin was broken, your arm may get swollen, red and warm to touch at the shot site. This is a normal response to the medicine in the shot. If you did not receive a tetanus shot today because you did not recall when your last one was given, make sure to check with your caregiver's office and determine if one is needed. Generally for a "dirty" wound, you  should receive a tetanus booster if you have not had one in the last five years. If you have a "clean" wound, you should receive a tetanus booster if you have not had one within the last ten years.  SEEK MEDICAL CARE IF:   You have pain not controlled with over the counter medications. Only take over-the-counter or prescription medicines for pain, discomfort, or fever as directed by your caregiver. Do not use aspirin as it may cause bleeding.   You develop increasing pain or swelling in the area of injury.   You develop any problems which seem worse than the problems which brought you in.  SEEK IMMEDIATE MEDICAL CARE IF:   You have a fever.   You develop severe pain in the area of the bruise out of proportion to the initial  injury.   The bruised area becomes red, tender, and swollen.  MAKE SURE YOU:   Understand these instructions.   Will watch your condition.   Will get help right away if you are not doing well or get worse.  Document Released: 06/14/2005 Document Revised: 05/17/2011 Document Reviewed: 04/22/2008 [facility] Patient Information 2012 [place], [place].Osteoarthritis Osteoarthritis is the most common form of arthritis. It is redness, soreness, and swelling (inflammation) affecting the cartilage. Cartilage acts as a cushion, covering the ends of bones where they meet to form a joint. CAUSES  Over time, the cartilage begins to wear away. This causes bone to rub on bone. This produces pain and stiffness in the affected joints. Factors that contribute to this problem are:  Excessive body weight.   Age.   Overuse of joints.  SYMPTOMS   People with osteoarthritis usually experience joint pain, swelling, or stiffness.   Over time, the joint may lose its normal shape.   Small deposits of bone (osteophytes) may grow on the edges of the joint.   Bits of bone or cartilage can break off and float inside the joint space. This may cause more pain and damage.   Osteoarthritis can lead to depression, anxiety, feelings of helplessness, and limitations on daily activities.  The most commonly affected joints are in the:  Ends of the fingers.   Thumbs.   Neck.   Lower back.   Knees.   Hips.  DIAGNOSIS  Diagnosis is mostly based on your symptoms and exam. Tests may be helpful, including:  X-rays of the affected joint.   A computerized magnetic scan (MRI).   Blood tests to rule out other types of arthritis.   Joint fluid tests. This involves using a needle to draw fluid from the joint and examining the fluid under a microscope.  TREATMENT  Goals of treatment are to control pain, improve joint function, maintain a normal body weight, and maintain a healthy lifestyle. Treatment approaches may  include:  A prescribed exercise program with rest and joint relief.   Weight control with nutritional education.   Pain relief techniques such as:   Properly applied heat and cold.   Electric pulses delivered to nerve endings under the skin (transcutaneous electrical nerve stimulation, TENS).   Massage.   Certain supplements. Ask your caregiver before using any supplements, especially in combination with prescribed drugs.   Medicines to control pain, such as:   Acetaminophen.   Nonsteroidal anti-inflammatory drugs (NSAIDs), such as naproxen.   Narcotic or central-acting agents, such as tramadol. This drug carries a risk of addiction and is generally prescribed for short-term use.   Corticosteroids. These can be given orally or as  injection. This is a short-term treatment, not recommended for routine use.   Surgery to reposition the bones and relieve pain (osteotomy) or to remove loose pieces of bone and cartilage. Joint replacement may be needed in advanced states of osteoarthritis.  HOME CARE INSTRUCTIONS  Your caregiver can recommend specific types of exercise. These may include:  Strengthening exercises. These are done to strengthen the muscles that support joints affected by arthritis. They can be performed with weights or with exercise bands to add resistance.   Aerobic activities. These are exercises, such as brisk walking or low-impact aerobics, that get your heart pumping. They can help keep your lungs and circulatory system in shape.   Range-of-motion activities. These keep your joints limber.   Balance and agility exercises. These help you maintain daily living skills.  Learning about your condition and being actively involved in your care will help improve the course of your osteoarthritis. SEEK MEDICAL CARE IF:   You feel hot or your skin turns red.   You develop a rash in addition to your joint pain.   You have an oral temperature above 102 F (38.9 C).  FOR  MORE INFORMATION  National Institute of Arthritis and Musculoskeletal and Skin Diseases: www.niams.http://www.myers.net/ [employer] on Aging: https://walker.com/ American College of Rheumatology: www.rheumatology.org Document Released: 09/04/2005 Document Revised: 05/17/2011 Document Reviewed: 12/16/2009 [facility] Patient Information 2012 [place], [place].

## 2011-11-16 ENCOUNTER — Telehealth: Payer: Self-pay | Admitting: Internal Medicine

## 2011-11-16 NOTE — Telephone Encounter (Signed)
The patient fell at work on Tuesday.  He has an injury to his back, neck, and head.  Since the fall he feels "like something is being pressed up against my esophagus".  No problems at all prior to the fall.  The patient is asking for an appt here today.  I advised him I did not think that he needs an appt here, but I will have his chart reviewed by Willette Cluster RNP and call him back.

## 2011-12-01 NOTE — Telephone Encounter (Signed)
If continues to have problems will work in for appointment here

## 2011-12-02 ENCOUNTER — Ambulatory Visit (INDEPENDENT_AMBULATORY_CARE_PROVIDER_SITE_OTHER): Payer: BC Managed Care – PPO | Admitting: Physician Assistant

## 2011-12-02 DIAGNOSIS — E236 Other disorders of pituitary gland: Secondary | ICD-10-CM

## 2011-12-02 DIAGNOSIS — E291 Testicular hypofunction: Secondary | ICD-10-CM

## 2011-12-23 ENCOUNTER — Ambulatory Visit: Payer: BC Managed Care – PPO | Admitting: Internal Medicine

## 2011-12-23 VITALS — BP 113/61 | HR 82 | Temp 98.4°F | Resp 16 | Ht 67.75 in | Wt 164.2 lb

## 2011-12-23 DIAGNOSIS — E291 Testicular hypofunction: Secondary | ICD-10-CM

## 2011-12-23 DIAGNOSIS — E236 Other disorders of pituitary gland: Secondary | ICD-10-CM

## 2011-12-23 MED ORDER — TESTOSTERONE CYPIONATE 200 MG/ML IM SOLN
150.0000 mg | INTRAMUSCULAR | Status: DC
  Administered 2012-02-10 – 2012-04-27 (×5): 150 mg via INTRAMUSCULAR

## 2011-12-23 NOTE — Progress Notes (Signed)
  Subjective:    Patient ID: Antonio [name], male    DOB: 1956-06-05, 56 y.o.   MRN: [id number]  HPI    Review of Systems     Objective:   Physical Exam        Assessment & Plan:  Hypogonadism  150 mg testosterone IM Followup in 14 days

## 2012-01-05 ENCOUNTER — Telehealth: Payer: Self-pay

## 2012-01-05 NOTE — Telephone Encounter (Signed)
Nettie Elm from [facility] faxed over mr Berkley Harvey and is needing the pt last cpe, labs, ekg and stress test by Monday pt has surgery if any questions please contact Nettie Elm @ [phone] Fax# [phone].

## 2012-01-05 NOTE — Telephone Encounter (Signed)
Last physical, labs and EKG from 2012 faxed to [facility] with confirmation.

## 2012-01-10 ENCOUNTER — Ambulatory Visit (INDEPENDENT_AMBULATORY_CARE_PROVIDER_SITE_OTHER): Payer: BC Managed Care – PPO | Admitting: Family Medicine

## 2012-01-10 VITALS — BP 124/80 | HR 80 | Temp 98.0°F | Resp 16 | Ht 67.5 in | Wt 166.0 lb

## 2012-01-10 DIAGNOSIS — E236 Other disorders of pituitary gland: Secondary | ICD-10-CM

## 2012-01-10 DIAGNOSIS — Z0000 Encounter for general adult medical examination without abnormal findings: Secondary | ICD-10-CM

## 2012-01-10 DIAGNOSIS — K219 Gastro-esophageal reflux disease without esophagitis: Secondary | ICD-10-CM

## 2012-01-10 DIAGNOSIS — K200 Eosinophilic esophagitis: Secondary | ICD-10-CM

## 2012-01-10 DIAGNOSIS — E291 Testicular hypofunction: Secondary | ICD-10-CM

## 2012-01-10 DIAGNOSIS — T7840XA Allergy, unspecified, initial encounter: Secondary | ICD-10-CM

## 2012-01-10 DIAGNOSIS — J45909 Unspecified asthma, uncomplicated: Secondary | ICD-10-CM

## 2012-01-10 LAB — COMPREHENSIVE METABOLIC PANEL
ALT: 20 U/L (ref 0–53)
AST: 18 U/L (ref 0–37)
BUN: 16 mg/dL (ref 6–23)
Calcium: 9.3 mg/dL (ref 8.4–10.5)
Creat: 1.02 mg/dL (ref 0.50–1.35)
Total Bilirubin: 1.5 mg/dL — ABNORMAL HIGH (ref 0.3–1.2)

## 2012-01-10 LAB — CBC WITH DIFFERENTIAL/PLATELET
Basophils Absolute: 0 10*3/uL (ref 0.0–0.1)
Basophils Relative: 0 % (ref 0–1)
Eosinophils Absolute: 0.2 10*3/uL (ref 0.0–0.7)
Eosinophils Relative: 2 % (ref 0–5)
HCT: 50.3 % (ref 39.0–52.0)
MCH: 31.2 pg (ref 26.0–34.0)
MCHC: 34.8 g/dL (ref 30.0–36.0)
MCV: 89.7 fL (ref 78.0–100.0)
Monocytes Absolute: 0.5 10*3/uL (ref 0.1–1.0)
Platelets: 213 10*3/uL (ref 150–400)
RDW: 13.9 % (ref 11.5–15.5)
WBC: 7 10*3/uL (ref 4.0–10.5)

## 2012-01-10 LAB — PSA: PSA: 0.58 ng/mL (ref <=4.00)

## 2012-01-10 LAB — LIPID PANEL
HDL: 46 mg/dL (ref >39)
Total CHOL/HDL Ratio: 5 Ratio
VLDL: 26 mg/dL (ref 0–40)

## 2012-01-10 LAB — TSH: TSH: 2.482 u[IU]/mL (ref 0.350–4.500)

## 2012-01-10 NOTE — Progress Notes (Addendum)
  Subjective:    Patient ID: Antonio [name], male    DOB: 10-28-1955, 56 y.o.   MRN: [id number]  HPI  Patient presents for CPE.  1) Fell 06/23/11 and injured (L).  Dr. Luiz Blare repaired ligamentous injury this past Thursday.  See intake sheet(to be scanned)  Tdap- UTD Pneumovax- UTD  Colonoscopy 2008- Nomal Gessner)  Review of Systems     Objective:   Physical Exam  Constitutional: He is oriented to person, place, and time. He appears well-developed and well-nourished.  HENT:  Head: Normocephalic and atraumatic.  Right Ear: External ear normal.  Left Ear: External ear normal.  Nose: Nose normal.  Mouth/Throat: Oropharynx is clear and moist.  Eyes: Conjunctivae and EOM are normal. Pupils are equal, round, and reactive to light.  Neck: Normal range of motion. Neck supple.  Cardiovascular: Normal rate, regular rhythm and normal heart sounds.   Pulmonary/Chest: Effort normal and breath sounds normal.  Abdominal: Soft. Bowel sounds are normal.  Genitourinary: Rectum normal and prostate normal.  Musculoskeletal: Normal range of motion.       (L) arm in cast; recent surgical repair  Neurological: He is alert and oriented to person, place, and time. No cranial nerve deficit. He exhibits normal muscle tone. Coordination normal.  Skin: Skin is warm.  Psychiatric: He has a normal mood and affect.     Results for orders placed in visit on 01/10/12  IFOBT (OCCULT BLOOD)      Component Value Range   IFOBT Negative         Assessment & Plan:   1. Routine general medical examination at a health care facility  CBC with Differential, Comprehensive metabolic panel, Lipid panel, TSH, PSA, IFOBT POC (occult bld, rslt in office)  2. Hypogonadism male  Testosterone injection provided  3. Extrinsic asthma, unspecified    4. Eosinophilic esophagitis    5. GERD    6. ALLERGY     Anticipatory guidance

## 2012-01-29 ENCOUNTER — Other Ambulatory Visit: Payer: Self-pay | Admitting: Family Medicine

## 2012-02-07 ENCOUNTER — Other Ambulatory Visit: Payer: Self-pay | Admitting: Family Medicine

## 2012-02-10 ENCOUNTER — Ambulatory Visit (INDEPENDENT_AMBULATORY_CARE_PROVIDER_SITE_OTHER): Payer: BC Managed Care – PPO | Admitting: Physician Assistant

## 2012-02-10 VITALS — BP 120/68 | HR 82 | Temp 98.0°F | Resp 16 | Ht 67.5 in | Wt 165.0 lb

## 2012-02-10 DIAGNOSIS — E291 Testicular hypofunction: Secondary | ICD-10-CM

## 2012-03-07 ENCOUNTER — Ambulatory Visit: Payer: BC Managed Care – PPO | Admitting: Physician Assistant

## 2012-03-07 VITALS — BP 121/68 | HR 73 | Temp 97.9°F | Resp 16

## 2012-03-07 DIAGNOSIS — E236 Other disorders of pituitary gland: Secondary | ICD-10-CM

## 2012-03-07 DIAGNOSIS — E291 Testicular hypofunction: Secondary | ICD-10-CM

## 2012-03-22 ENCOUNTER — Other Ambulatory Visit: Payer: Self-pay | Admitting: Family Medicine

## 2012-03-22 MED ORDER — TESTOSTERONE ENANTHATE 200 MG/ML IM SOLN: INTRAMUSCULAR | Status: DC

## 2012-03-23 ENCOUNTER — Ambulatory Visit: Payer: BC Managed Care – PPO | Admitting: Physician Assistant

## 2012-03-23 DIAGNOSIS — E291 Testicular hypofunction: Secondary | ICD-10-CM

## 2012-03-23 DIAGNOSIS — E236 Other disorders of pituitary gland: Secondary | ICD-10-CM

## 2012-04-13 ENCOUNTER — Ambulatory Visit (INDEPENDENT_AMBULATORY_CARE_PROVIDER_SITE_OTHER): Payer: BC Managed Care – PPO | Admitting: Physician Assistant

## 2012-04-13 VITALS — BP 140/78 | HR 76 | Temp 98.8°F | Resp 16 | Ht 70.0 in | Wt 165.0 lb

## 2012-04-13 DIAGNOSIS — E291 Testicular hypofunction: Secondary | ICD-10-CM

## 2012-04-13 DIAGNOSIS — E236 Other disorders of pituitary gland: Secondary | ICD-10-CM

## 2012-04-27 ENCOUNTER — Ambulatory Visit (INDEPENDENT_AMBULATORY_CARE_PROVIDER_SITE_OTHER): Payer: BC Managed Care – PPO | Admitting: Family Medicine

## 2012-04-27 VITALS — BP 118/72 | HR 72 | Temp 98.2°F | Resp 14 | Ht 67.5 in | Wt 166.0 lb

## 2012-04-27 DIAGNOSIS — E291 Testicular hypofunction: Secondary | ICD-10-CM

## 2012-05-14 ENCOUNTER — Telehealth: Payer: Self-pay | Admitting: Internal Medicine

## 2012-05-14 NOTE — Telephone Encounter (Signed)
I have left a message for the patient that if he needs to continue on Protonix, he will need an office visit every two years.  I have asked that he call back to schedule an office visit if he still needs protonix and his primary care MD is not writing it for him.

## 2012-05-17 ENCOUNTER — Ambulatory Visit: Payer: BC Managed Care – PPO | Admitting: Physician Assistant

## 2012-05-17 VITALS — BP 128/72 | HR 72 | Temp 98.4°F | Resp 16 | Ht 67.75 in | Wt 166.4 lb

## 2012-05-17 DIAGNOSIS — E291 Testicular hypofunction: Secondary | ICD-10-CM

## 2012-05-17 MED ORDER — TESTOSTERONE CYPIONATE 100 MG/ML IM SOLN
150.0000 mg | INTRAMUSCULAR | Status: DC
  Administered 2012-05-17: 150 mg via INTRAMUSCULAR

## 2012-05-17 NOTE — Progress Notes (Signed)
   Patient ID: SIRUS LABRIE MRN: [id number], DOB: 02/27/1956, 56 y.o. Date of Encounter: 05/17/2012, 2:17 PM  Primary Physician: Tonye Pearson, MD  Chief Complaint: Here for testosterone injection  56 y.o. year old male here for testosterone injection. Last injection 04/27/12.  Last PSA 01/10/12. Ok to give testosterone injection. Will need office visit in 2 months. This was a nursing only encounter. No provider/patient encounter occurred today.    Signed, Eula Listen, PA-C 05/17/2012 2:17 PM

## 2012-05-21 ENCOUNTER — Encounter: Payer: Self-pay | Admitting: Internal Medicine

## 2012-06-08 ENCOUNTER — Ambulatory Visit (INDEPENDENT_AMBULATORY_CARE_PROVIDER_SITE_OTHER): Payer: BC Managed Care – PPO | Admitting: Radiology

## 2012-06-08 DIAGNOSIS — E291 Testicular hypofunction: Secondary | ICD-10-CM

## 2012-06-08 MED ORDER — TESTOSTERONE CYPIONATE 200 MG/ML IM SOLN
150.0000 mg | INTRAMUSCULAR | Status: DC
  Administered 2012-06-08: 150 mg via INTRAMUSCULAR

## 2012-06-12 NOTE — Progress Notes (Signed)
  Subjective:    Patient ID: Antonio [name], male    DOB: [birth year], 56 y.o.   MRN: [id number]  HPI    Review of Systems     Objective:   Physical Exam        Assessment & Plan:  Testosterone injection only

## 2012-06-12 NOTE — Progress Notes (Signed)
Testosterone injection only

## 2012-06-18 ENCOUNTER — Encounter (HOSPITAL_COMMUNITY): Payer: Self-pay | Admitting: Emergency Medicine

## 2012-06-18 ENCOUNTER — Emergency Department (HOSPITAL_COMMUNITY)
Admission: EM | Admit: 2012-06-18 | Discharge: 2012-06-19 | Disposition: A | Discharge status: Home / Self Care | Payer: BC Managed Care – PPO | Attending: Emergency Medicine | Admitting: Emergency Medicine

## 2012-06-18 DIAGNOSIS — S6990XA Unspecified injury of unspecified wrist, hand and finger(s), initial encounter: Secondary | ICD-10-CM | POA: Insufficient documentation

## 2012-06-18 DIAGNOSIS — J45909 Unspecified asthma, uncomplicated: Secondary | ICD-10-CM | POA: Insufficient documentation

## 2012-06-18 DIAGNOSIS — X58XXXA Exposure to other specified factors, initial encounter: Secondary | ICD-10-CM | POA: Insufficient documentation

## 2012-06-18 DIAGNOSIS — Z09 Encounter for follow-up examination after completed treatment for conditions other than malignant neoplasm: Secondary | ICD-10-CM | POA: Insufficient documentation

## 2012-06-18 DIAGNOSIS — Z885 Allergy status to narcotic agent status: Secondary | ICD-10-CM | POA: Insufficient documentation

## 2012-06-18 DIAGNOSIS — S60229A Contusion of unspecified hand, initial encounter: Secondary | ICD-10-CM

## 2012-06-18 DIAGNOSIS — Q984 Klinefelter syndrome, unspecified: Secondary | ICD-10-CM | POA: Insufficient documentation

## 2012-06-18 DIAGNOSIS — K219 Gastro-esophageal reflux disease without esophagitis: Secondary | ICD-10-CM | POA: Insufficient documentation

## 2012-06-18 DIAGNOSIS — J3089 Other allergic rhinitis: Secondary | ICD-10-CM | POA: Insufficient documentation

## 2012-06-18 NOTE — ED Notes (Signed)
Pt alert, arrives from home, seen in ED [place] last week had blood sample taken, states hard stick, believes injection site infected, no s/s noted resp even unlabored, skin pwd

## 2012-06-19 NOTE — ED Provider Notes (Signed)
History     CSN: [id number]  Arrival date & time 06/18/12  2107   First MD Initiated Contact with Patient 06/19/12 0109      Chief Complaint  Patient presents with  . Wound Check    (Consider location/radiation/quality/duration/timing/severity/associated sxs/prior treatment) Patient is a 56 y.o. male presenting with wound check.  Wound Check    The patient presents to the emergency department for bilateral posterior hand wounds.  The patient was brought to the emergency department in [facility] on Friday (06/17/12) and received multiple venous punctures on his hands and antecubital fossa due to failed attempts to start IV access.  The patient reports tenderness and ecchymosis.  He denies fevers, inflammation, erythema, and joint pain.       Past Medical History  Diagnosis Date  . GERD (gastroesophageal reflux disease)   . Allergy history unknown     seasonal   . Asthma   . Klinefelter syndrome     Past Surgical History  Procedure Date  . Cholecystectomy     No family history on file.  History  Substance Use Topics  . Smoking status: Never Smoker   . Smokeless tobacco: Not on file  . Alcohol Use: Not on file      Review of Systems All pertinent positives and negatives in the history of present illness   Allergies  Codeine  Home Medications   Current Outpatient Rx  Name Route Sig Dispense Refill  . ALBUTEROL SULFATE HFA 108 (90 BASE) MCG/ACT IN AERS Inhalation Inhale 2 puffs into the lungs every 6 (six) hours as needed. For wheezing or shortness of breath    . ASPIRIN 81 MG PO TABS Oral Take 81 mg by mouth daily.    . IBUPROFEN 200 MG PO TABS Oral Take 400 mg by mouth every 6 (six) hours as needed. For pain    . LORATADINE 10 MG PO TABS Oral Take 10 mg by mouth daily.    Marland Kitchen MONTELUKAST SODIUM 10 MG PO TABS Oral Take 10 mg by mouth at bedtime.    . ADULT MULTIVITAMIN W/MINERALS CH Oral Take 1 tablet by mouth daily.    Marland Kitchen PANTOPRAZOLE SODIUM 40 MG PO TBEC Oral  Take 1 tablet (40 mg total) by mouth daily. 30 tablet 11  . PSEUDOEPHEDRINE-IBUPROFEN 30-200 MG PO CAPS Oral Take 2 capsules by mouth every 6 (six) hours as needed.    . TESTOSTERONE ENANTHATE 200 MG/ML IM OIL Intramuscular Inject 150 mg into the muscle every 14 (fourteen) days.      BP 114/64  Pulse 89  Temp 98 F (36.7 C) (Oral)  Resp 16  SpO2 99%  Physical Exam  Constitutional: He is oriented to person, place, and time. He appears well-developed and well-nourished. No distress.  Pulmonary/Chest: Effort normal.  Musculoskeletal: Normal range of motion. He exhibits no edema.  Neurological: He is alert and oriented to person, place, and time.  Skin: Skin is warm and dry. Ecchymosis noted. No rash noted. No erythema. No pallor.    ED Course  Procedures (including critical care time)   The patient has what appears to be normal bruising from IV sticks that he had. The patient has no signs of cellulitis based on his exam. The patient is advised to return here as needed. Follow up with his PCP.  MDM          Carlyle Dolly, PA-C 06/19/12 0159

## 2012-06-20 NOTE — ED Provider Notes (Signed)
Medical screening examination/treatment/procedure(s) were performed by non-physician practitioner and as supervising physician I was immediately available for consultation/collaboration.    Vida Roller, MD 06/20/12 [phone]

## 2012-06-29 ENCOUNTER — Ambulatory Visit (INDEPENDENT_AMBULATORY_CARE_PROVIDER_SITE_OTHER): Payer: BC Managed Care – PPO | Admitting: Physician Assistant

## 2012-06-29 VITALS — BP 128/74 | HR 78 | Temp 98.2°F | Resp 16 | Ht 69.0 in | Wt 166.8 lb

## 2012-06-29 DIAGNOSIS — E291 Testicular hypofunction: Secondary | ICD-10-CM

## 2012-06-29 MED ORDER — TESTOSTERONE CYPIONATE 200 MG/ML IM SOLN
150.0000 mg | Freq: Once | INTRAMUSCULAR | Status: AC
  Administered 2012-06-29: 150 mg via INTRAMUSCULAR

## 2012-06-29 NOTE — Progress Notes (Signed)
   [address], [place] [address]   Phone [phone]  Subjective:    Patient ID: Antonio [name], male    DOB: November 14, 1955, 56 y.o.   MRN: [id number]  HPI  Ok to give testosterone injection but upon review of chart it looks as though the testosterone is ordered q2wks but the patient does not seem to be getting it that often.  Requested pt RTC next month for recheck labs and discussion of meds.  Review of Systems     Objective:   Physical Exam        Assessment & Plan:

## 2012-07-09 ENCOUNTER — Encounter: Payer: Self-pay | Admitting: Cardiology

## 2012-07-09 ENCOUNTER — Ambulatory Visit (INDEPENDENT_AMBULATORY_CARE_PROVIDER_SITE_OTHER): Payer: BC Managed Care – PPO | Admitting: Cardiology

## 2012-07-09 VITALS — BP 111/75 | HR 89 | Ht 69.0 in | Wt 168.0 lb

## 2012-07-09 DIAGNOSIS — R42 Dizziness and giddiness: Secondary | ICD-10-CM | POA: Insufficient documentation

## 2012-07-09 NOTE — Patient Instructions (Addendum)
Your physician has requested that you have an echocardiogram. Echocardiography is a painless test that uses sound waves to create images of your heart. It provides your doctor with information about the size and shape of your heart and how well your heart's chambers and valves are working. This procedure takes approximately one hour. There are no restrictions for this procedure.  You do not need to schedule a follow-up appointment with Dr McLean. 

## 2012-07-09 NOTE — Progress Notes (Signed)
Patient ID: Antonio [name], male   DOB: 02-Aug-1956, 56 y.o.   MRN: [id number] PCP: Dr. Perrin Maltese  56 yo with history of Klinefelter Syndrome presents for evaluation of lightheaded episode.  Back in 9/13, patient had an episode where he bent over to pick up a heavy box at work and became very lightheaded.  He did not pass out.  The lightheadedness persisted for 2-3 hours.  The sensation does not seem to have been vertigo-like.  He went to the ER at [facility]. He was worked up for a stroke there and had negative head CT and negative head MRI.  His symptoms resolved spontaneously and he went home.  He has had no similar symptoms since that time.  He has not had similar spells prior to this. He has good exercise tolerance.  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  No exertional dyspnea or chest pain.  No palpitations.  I checked orthostatics in the office today: supine BP was 121/66 and dropped to 108/72 with standing.   ECG: NSR, normal  Labs (9/13): HCT 42.4, TnI 0  PMH: 1. Klinefelter Syndrome: gets testosterone replacement.  2. GERD 3. Asthma 4. H/o cholecystectomy 5. Hyperlipidemia 6. Allergic rhinitis 7. 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   FH: No known heart disease.   ROS: All systems reviewed and negative except as per HPI.   Current Outpatient Prescriptions  Medication Sig Dispense Refill  . albuterol (PROVENTIL HFA;VENTOLIN HFA) 108 (90 BASE) MCG/ACT inhaler Inhale 2 puffs into the lungs every 6 (six) hours as needed. For wheezing or shortness of breath      . aspirin 81 MG tablet Take 81 mg by mouth daily.      Marland Kitchen ibuprofen (ADVIL,MOTRIN) 200 MG tablet Take 400 mg by mouth every 6 (six) hours as needed. For pain      . loratadine (CLARITIN) 10 MG tablet Take 10 mg by mouth daily.      . montelukast (SINGULAIR) 10 MG tablet Take 10 mg by mouth at bedtime.      . Multiple Vitamin (MULTIVITAMIN  WITH MINERALS) TABS Take 1 tablet by mouth daily.      . pantoprazole (PROTONIX) 40 MG tablet Take 1 tablet (40 mg total) by mouth daily.  30 tablet  11  . Pseudoephedrine-Ibuprofen (ADVIL COLD & SINUS LIQUI-GELS) 30-200 MG CAPS Take 2 capsules by mouth every 6 (six) hours as needed.      . testosterone enanthate (DELATESTRYL) 200 MG/ML injection Inject 150 mg into the muscle every 14 (fourteen) days.       Current Facility-Administered Medications  Medication Dose Route Frequency Provider Last Rate Last Dose  . testosterone cypionate (DEPOTESTOTERONE CYPIONATE) injection 150 mg  150 mg Intramuscular Q14 Days Morrell Riddle, PA-C   150 mg at 01/10/12 1411  . testosterone cypionate (DEPOTESTOTERONE CYPIONATE) injection 150 mg  150 mg Intramuscular Q14 Days Tonye Pearson, MD   150 mg at 04/27/12 1727  . testosterone cypionate (DEPOTESTOTERONE CYPIONATE) injection 150 mg  150 mg Intramuscular Q14 Days Ryan M Dunn, PA-C   150 mg at 05/17/12 1431    BP 111/75  Pulse 89  Ht 5\' 9"  (1.753 m)  Wt 168 lb (76.204 kg)  BMI 24.81 kg/m2 General: NAD Neck: No JVD, no thyromegaly or thyroid nodule.  Lungs: Clear to auscultation bilaterally with normal respiratory effort. CV: Nondisplaced PMI.  Heart regular S1/S2,  no S3/S4, no murmur.  No peripheral edema.  No carotid bruit.  Normal pedal pulses.  Abdomen: Soft, nontender, no hepatosplenomegaly, no distention.  Skin: Intact without lesions or rashes.  Neurologic: Alert and oriented x 3.  Psych: Normal affect. Extremities: No clubbing or cyanosis.  HEENT: Normal.   Assessment/Plan: 56 yo with history of Klinefelters Syndrome presents for evaluation of a lightheaded spell.  He had a single episode after bending over to pick up a box.  The lightheadedness lasted for at least 2 hours.  No syncope, no palpitations.  The symptoms do not sound like vertigo.  It is possible that he was dehydrated that day and the symptoms represented orthostasis => he was  borderline orthostatic in the office today.  I asked him to try to keep himself better hydrated during the day.  I will also get an echocardiogram to make sure that his heart is structurally normal with no valvular abnormalities that could cause lightheadedness.  He will return prn if the echo is unremarkable.   Marca Ancona 07/09/2012 2:37 PM

## 2012-07-10 ENCOUNTER — Ambulatory Visit (INDEPENDENT_AMBULATORY_CARE_PROVIDER_SITE_OTHER): Payer: BC Managed Care – PPO | Admitting: Internal Medicine

## 2012-07-10 ENCOUNTER — Encounter: Payer: Self-pay | Admitting: Internal Medicine

## 2012-07-10 VITALS — BP 120/70 | HR 60 | Ht 67.5 in | Wt 168.6 lb

## 2012-07-10 DIAGNOSIS — K219 Gastro-esophageal reflux disease without esophagitis: Secondary | ICD-10-CM

## 2012-07-10 DIAGNOSIS — M47812 Spondylosis without myelopathy or radiculopathy, cervical region: Secondary | ICD-10-CM | POA: Insufficient documentation

## 2012-07-10 DIAGNOSIS — K200 Eosinophilic esophagitis: Secondary | ICD-10-CM

## 2012-07-10 MED ORDER — PANTOPRAZOLE SODIUM 40 MG PO TBEC: 40.0000 mg | DELAYED_RELEASE_TABLET | Freq: Every day | ORAL | Status: DC

## 2012-07-10 NOTE — Progress Notes (Signed)
  Subjective:    Patient ID: Antonio [name], male    DOB: Apr 30, 1956, 56 y.o.   MRN: [id number]  HPI Here for follow-up of GERD and eosinophilic esophagitis. He is doing well without dysphagia or GERD sxs on pantoprazole. Also takes monteleukast for asthma - and asthma ok except rare albuterol use. Has had falls and light-headednes this year and also getting C-spine injections for DJD.  Medications, allergies, past medical history, past surgical history, family history and social history are reviewed and updated in the EMR.  Review of Systems as above   Objective:   Physical Exam General:  NAD Eyes:   anicteric Lungs:  clear Heart:  S1S2 no rubs, murmurs or gallops      Assessment & Plan:   1. Eosinophilic esophagitis   2. GERD (gastroesophageal reflux disease)    1. Doing well - stay on PPI and monteleukast 2. See me 2 yrs routine, sooner prn, PPI refill ok in between if ok 3. We did discuss rarae possible bone loss with chronic PPI but he needs the PPI for sure

## 2012-07-10 NOTE — Patient Instructions (Addendum)
Please follow-up in 2 years or sooner if you need [place].   We have sent the following medications to your pharmacy for you to pick up at your convenience: Protonix  Thank you for choosing me and [facility].  Iva Boop, M.D., [facility]

## 2012-07-11 ENCOUNTER — Ambulatory Visit (HOSPITAL_COMMUNITY): Payer: BC Managed Care – PPO | Attending: Cardiovascular Disease | Admitting: Radiology

## 2012-07-11 ENCOUNTER — Telehealth: Payer: Self-pay

## 2012-07-11 ENCOUNTER — Other Ambulatory Visit: Payer: Self-pay

## 2012-07-11 DIAGNOSIS — J45909 Unspecified asthma, uncomplicated: Secondary | ICD-10-CM | POA: Insufficient documentation

## 2012-07-11 DIAGNOSIS — R42 Dizziness and giddiness: Secondary | ICD-10-CM | POA: Insufficient documentation

## 2012-07-11 DIAGNOSIS — I369 Nonrheumatic tricuspid valve disorder, unspecified: Secondary | ICD-10-CM | POA: Insufficient documentation

## 2012-07-11 NOTE — Progress Notes (Signed)
Echocardiogram performed.  

## 2012-07-11 NOTE — Telephone Encounter (Signed)
Please call the patient. Would he like to to change medications, or pharmacies?

## 2012-07-11 NOTE — Telephone Encounter (Signed)
Please advise 

## 2012-07-11 NOTE — Telephone Encounter (Signed)
[facility] STATES THEY NO LONGER CARRY THE TESTOSTERONE MEDICINE AND WOULD LIKE TO KNOW IF THEY COULD CHANGE TO SOMETHING ELSE PLEASE CALL [phone]    [facility] AT [phone]

## 2012-07-12 NOTE — Telephone Encounter (Signed)
Called patient, spoke to his wife, and medication should be changed, because it is no longer being manufactured.  She wants [place] to call patient at [phone], to advise what we change it to.

## 2012-07-13 MED ORDER — TESTOSTERONE CYPIONATE 200 MG/ML IM SOLN: 150.0000 mg | INTRAMUSCULAR | Status: DC

## 2012-07-13 NOTE — Telephone Encounter (Signed)
Medication printed and up front at TL desk.

## 2012-07-13 NOTE — Telephone Encounter (Signed)
LMOM RX sent to pharmacy 

## 2012-07-15 ENCOUNTER — Ambulatory Visit (INDEPENDENT_AMBULATORY_CARE_PROVIDER_SITE_OTHER): Payer: BC Managed Care – PPO | Admitting: Physician Assistant

## 2012-07-15 ENCOUNTER — Encounter: Payer: Self-pay | Admitting: Physician Assistant

## 2012-07-15 VITALS — BP 142/65 | HR 94 | Temp 98.0°F | Resp 16 | Ht 68.25 in | Wt 169.6 lb

## 2012-07-15 DIAGNOSIS — E291 Testicular hypofunction: Secondary | ICD-10-CM

## 2012-07-15 MED ORDER — TESTOSTERONE CYPIONATE 200 MG/ML IM SOLN
150.0000 mg | Freq: Once | INTRAMUSCULAR | Status: AC
  Administered 2012-07-15: 150 mg via INTRAMUSCULAR

## 2012-07-15 NOTE — Progress Notes (Signed)
   [address] [place] [address]   Phone [phone]   Subjective:    Patient ID: Antonio [name], male    DOB: 10/31/1955, 56 y.o.   MRN: [id number]  HPI  Pt presents for his testosterone injection.  We can give tonight without an OV due to large patient volume late at night but he must have an OV and labs before his next injection!!!  Review of Systems     Objective:   Physical Exam        Assessment & Plan:

## 2012-07-18 ENCOUNTER — Ambulatory Visit (INDEPENDENT_AMBULATORY_CARE_PROVIDER_SITE_OTHER): Payer: BC Managed Care – PPO | Admitting: Family Medicine

## 2012-07-18 ENCOUNTER — Encounter: Payer: Self-pay | Admitting: Family Medicine

## 2012-07-18 VITALS — Temp 98.1°F

## 2012-07-18 DIAGNOSIS — Z23 Encounter for immunization: Secondary | ICD-10-CM

## 2012-07-28 ENCOUNTER — Ambulatory Visit (INDEPENDENT_AMBULATORY_CARE_PROVIDER_SITE_OTHER): Payer: BC Managed Care – PPO | Admitting: Emergency Medicine

## 2012-07-28 ENCOUNTER — Ambulatory Visit: Payer: BC Managed Care – PPO

## 2012-07-28 VITALS — BP 111/69 | HR 77 | Temp 98.0°F | Resp 17 | Ht 69.0 in | Wt 166.0 lb

## 2012-07-28 DIAGNOSIS — M25561 Pain in right knee: Secondary | ICD-10-CM

## 2012-07-28 DIAGNOSIS — E291 Testicular hypofunction: Secondary | ICD-10-CM

## 2012-07-28 DIAGNOSIS — M25569 Pain in unspecified knee: Secondary | ICD-10-CM

## 2012-07-28 DIAGNOSIS — E785 Hyperlipidemia, unspecified: Secondary | ICD-10-CM

## 2012-07-28 LAB — LIPID PANEL
Cholesterol: 192 mg/dL (ref 0–200)
LDL Cholesterol: 130 mg/dL — ABNORMAL HIGH (ref 0–99)
Total CHOL/HDL Ratio: 4.7 Ratio
Triglycerides: 106 mg/dL (ref <150)
VLDL: 21 mg/dL (ref 0–40)

## 2012-07-28 MED ORDER — MELOXICAM 7.5 MG PO TABS: ORAL_TABLET | ORAL | Status: DC

## 2012-07-28 MED ORDER — TESTOSTERONE CYPIONATE 200 MG/ML IM SOLN
150.0000 mg | Freq: Once | INTRAMUSCULAR | Status: AC
  Administered 2012-07-28: 150 mg via INTRAMUSCULAR

## 2012-07-28 NOTE — Progress Notes (Signed)
  Subjective:    Patient ID: Antonio [name], male    DOB: 08-07-1956, 56 y.o.   MRN: [id number]  HPI Pt presents to clinic today with Bil knee pain and popping. This past Wednesday he recalls a popping noise in his Rt knee. He states he has no prior injury to his knees. He also wants his Chol, PSA, and Testosterone level checked.   Review of Systems     Objective:   Physical Exam is alert and cooperative. Examination of the knees reveals normal tracking of the patellas. There is no swelling noted. He has a negative McMurray's negative anterior drawer sign   [facility] reading (PRIMARY) by  Dr.Daub there is mild arthritic change in the kneecaps       Assessment & Plan:  Apparently he had an episode approximately 7 weeks ago of dizziness and was evaluated in [facility] with normal findings. I have requested copies of these records. We'll start him on some knee exercises for strengthening. I told him he could use his TENS unit he has at home if he desires. He will be on Mobic 7.5 one to 2 daily as needed for knee pain to

## 2012-07-29 LAB — TESTOSTERONE, FREE, TOTAL, SHBG
Sex Hormone Binding: 29 nmol/L (ref 13–71)
Testosterone: 389.44 ng/dL (ref 300–890)

## 2012-08-14 ENCOUNTER — Telehealth: Payer: Self-pay | Admitting: *Deleted

## 2012-08-14 ENCOUNTER — Ambulatory Visit (INDEPENDENT_AMBULATORY_CARE_PROVIDER_SITE_OTHER): Payer: BC Managed Care – PPO | Admitting: Physician Assistant

## 2012-08-14 DIAGNOSIS — E291 Testicular hypofunction: Secondary | ICD-10-CM

## 2012-08-14 MED ORDER — TESTOSTERONE CYPIONATE 100 MG/ML IM SOLN
200.0000 mg | Freq: Once | INTRAMUSCULAR | Status: AC
  Administered 2012-08-14: 200 mg via INTRAMUSCULAR

## 2012-09-01 ENCOUNTER — Ambulatory Visit (INDEPENDENT_AMBULATORY_CARE_PROVIDER_SITE_OTHER): Payer: BC Managed Care – PPO | Admitting: Physician Assistant

## 2012-09-01 VITALS — BP 132/71 | HR 77 | Temp 98.3°F | Resp 16

## 2012-09-01 DIAGNOSIS — E291 Testicular hypofunction: Secondary | ICD-10-CM

## 2012-09-01 MED ORDER — TESTOSTERONE CYPIONATE 200 MG/ML IM SOLN
150.0000 mg | Freq: Once | INTRAMUSCULAR | Status: AC
  Administered 2012-09-01: 150 mg via INTRAMUSCULAR

## 2012-09-15 ENCOUNTER — Ambulatory Visit (INDEPENDENT_AMBULATORY_CARE_PROVIDER_SITE_OTHER): Payer: BC Managed Care – PPO | Admitting: Physician Assistant

## 2012-09-15 DIAGNOSIS — E236 Other disorders of pituitary gland: Secondary | ICD-10-CM

## 2012-09-15 DIAGNOSIS — E291 Testicular hypofunction: Secondary | ICD-10-CM

## 2012-09-15 MED ORDER — TESTOSTERONE CYPIONATE 200 MG/ML IM SOLN: 150.0000 mg | INTRAMUSCULAR | Status: DC

## 2012-09-15 MED ORDER — TESTOSTERONE CYPIONATE 200 MG/ML IM SOLN
150.0000 mg | Freq: Once | INTRAMUSCULAR | Status: AC
  Administered 2012-09-15: 150 mg via INTRAMUSCULAR

## 2012-09-15 NOTE — Progress Notes (Signed)
Patient here for testosterone injection. Ok to give. Also refilled his rx.

## 2012-10-04 ENCOUNTER — Ambulatory Visit: Payer: BC Managed Care – PPO | Admitting: Physician Assistant

## 2012-10-04 VITALS — BP 95/65 | HR 88 | Temp 98.7°F | Resp 18 | Wt 172.0 lb

## 2012-10-04 DIAGNOSIS — R7989 Other specified abnormal findings of blood chemistry: Secondary | ICD-10-CM

## 2012-10-04 DIAGNOSIS — R05 Cough: Secondary | ICD-10-CM

## 2012-10-04 DIAGNOSIS — J329 Chronic sinusitis, unspecified: Secondary | ICD-10-CM

## 2012-10-04 DIAGNOSIS — E291 Testicular hypofunction: Secondary | ICD-10-CM

## 2012-10-04 DIAGNOSIS — R059 Cough, unspecified: Secondary | ICD-10-CM

## 2012-10-04 DIAGNOSIS — Q984 Klinefelter syndrome, unspecified: Secondary | ICD-10-CM

## 2012-10-04 MED ORDER — IPRATROPIUM BROMIDE 0.03 % NA SOLN: 2.0000 | Freq: Two times a day (BID) | NASAL | Status: DC

## 2012-10-04 MED ORDER — AMOXICILLIN-POT CLAVULANATE 875-125 MG PO TABS: 1.0000 | ORAL_TABLET | Freq: Two times a day (BID) | ORAL | Status: DC

## 2012-10-04 MED ORDER — BENZONATATE 100 MG PO CAPS: 100.0000 mg | ORAL_CAPSULE | Freq: Three times a day (TID) | ORAL | Status: DC | PRN

## 2012-10-04 MED ORDER — TESTOSTERONE CYPIONATE 200 MG/ML IM SOLN
200.0000 mg | Freq: Once | INTRAMUSCULAR | Status: AC
  Administered 2012-10-04: 200 mg via INTRAMUSCULAR

## 2012-10-04 MED ORDER — GUAIFENESIN ER 1200 MG PO TB12: 1.0000 | ORAL_TABLET | Freq: Two times a day (BID) | ORAL | Status: DC | PRN

## 2012-10-04 NOTE — Progress Notes (Signed)
Subjective:    Patient ID: Antonio [name], male    DOB: August 18, 1956, 57 y.o.   MRN: [id number]  HPI This 57 y.o. male presents for evaluation of illness x almost one week.  "Glands in the neck are swollen.  Fever on Tuesday.  Head cold, drainage, sorethroat, sniffles, nasal congestion.  It's moved to my chest, I'm coughing and hacking and it's started to hurt.  I'm wheezing more than usual.  The infection has caused some stress in my lungs.  A hell of a lot of sneezing.  No HA."  Has been resting (on vacation x 4 days).  Additionally, he needs his testosterone injection.  Last labs 07/2012 with Dr. Cleta Alberts, PCP is listed as Dr. Merla Riches, last OV 01/2012.  Past Medical History  Diagnosis Date  . GERD (gastroesophageal reflux disease)   . Asthma   . Klinefelter syndrome   . Hyperlipidemia   . Chronic headaches   . Eosinophilic esophagitis   . Seasonal allergies   . Cervical spine degeneration     Past Surgical History  Procedure Date  . Cholecystectomy     ERCP for cbd stones  . Tonsillectomy   . Cataract surgery     bilateral  . Hand tendon surgery     left, work comp  . Ercp   . Colonoscopy   . Upper gastrointestinal endoscopy     Prior to Admission medications   Medication Sig Start Date End Date Taking? Authorizing Provider  albuterol (PROVENTIL HFA;VENTOLIN HFA) 108 (90 BASE) MCG/ACT inhaler Inhale 2 puffs into the lungs every 6 (six) hours as needed. For wheezing or shortness of breath   Yes Historical Provider, MD  aspirin 81 MG tablet Take 81 mg by mouth daily.   Yes Historical Provider, MD  ibuprofen (ADVIL,MOTRIN) 200 MG tablet Take 400 mg by mouth every 6 (six) hours as needed. For pain   Yes Historical Provider, MD  loratadine (CLARITIN) 10 MG tablet Take 10 mg by mouth daily.   Yes Historical Provider, MD  meloxicam (MOBIC) 7.5 MG tablet Take 1-2 tablets daily as needed for knee pain 07/28/12  Yes Collene Gobble, MD  montelukast (SINGULAIR) 10 MG tablet Take 10 mg  by mouth at bedtime.   Yes Historical Provider, MD  Multiple Vitamin (MULTIVITAMIN WITH MINERALS) TABS Take 1 tablet by mouth daily.   Yes Historical Provider, MD  pantoprazole (PROTONIX) 40 MG tablet Take 1 tablet (40 mg total) by mouth daily. 07/10/12  Yes Iva Boop, MD  Pseudoephedrine-Ibuprofen (ADVIL COLD & SINUS LIQUI-GELS) 30-200 MG CAPS Take 2 capsules by mouth every 6 (six) hours as needed.   Yes Historical Provider, MD  testosterone cypionate (DEPO-TESTOSTERONE) 200 MG/ML injection Inject 0.75 mLs (150 mg total) into the muscle every 14 (fourteen) days. 07/13/12  Yes Ryan M Dunn, PA-C    Allergies  Allergen Reactions  . Codeine Other (See Comments)    REACTION: difficulty breathing, nausea    History   Social History  . Marital Status: Married    Spouse Name: [name]    Number of Children: 0  . Years of Education: 16   Occupational History  . DRIVER    Social History Main Topics  . Smoking status: Passive Smoke Exposure - Never Smoker  . Smokeless tobacco: Never Used  . Alcohol Use: No  . Drug Use: No  . Sexually Active: Yes -- Male partner(s)   Other Topics Concern  . Not on file   Social History Narrative  Lives with his wife.    Family History  Problem Relation Age of Onset  . Colon cancer Neg Hx   . Irritable bowel syndrome Sister   . Allergies Sister   . Allergies Mother    Review of Systems As above.    Objective:   Physical Exam  Blood pressure 95/65, pulse 88, temperature 98.7 F (37.1 C), temperature source Oral, resp. rate 18, weight 172 lb (78.019 kg). There is no height on file to calculate BMI. Well-developed, well nourished WM who is awake, alert and oriented, in NAD. HEENT: [place]/AT, PERRL, EOMI.  Sclera and conjunctiva are clear.  EAC are patent, TMs are dull, but otherwise normal in appearance. Nasal mucosa is pink and moist, but congested. OP is clear. Neck: supple, non-tender, no lymphadenopathy, thyromegaly. Heart: RRR, no  murmur Lungs: normal effort, CTA. No wheezing (though upper airway sounds are noted) Extremities: no cyanosis, clubbing or edema. Skin: warm and dry without rash. Psychologic: good mood and appropriate affect, normal speech and behavior.     Assessment & Plan:   1. Low testosterone  testosterone cypionate (DEPOTESTOTERONE CYPIONATE) injection 200 mg  2. Klinefelter's syndrome    3. Sinusitis  ipratropium (ATROVENT) 0.03 % nasal spray, Guaifenesin (MUCINEX MAXIMUM STRENGTH) 1200 MG TB12, amoxicillin-clavulanate (AUGMENTIN) 875-125 MG per tablet  4. Cough  benzonatate (TESSALON) 100 MG capsule   If his symptoms do not begin to improve in the next 2-3 days, he is advised to call.  I would consider calling in a prednisone taper unless his symptoms indicate illness other than sinusitis or complications.

## 2012-10-04 NOTE — Patient Instructions (Signed)
Get plenty of rest and drink at least 64 ounces of water daily. 

## 2012-10-11 ENCOUNTER — Other Ambulatory Visit: Payer: Self-pay | Admitting: Physician Assistant

## 2012-10-24 ENCOUNTER — Ambulatory Visit: Payer: BC Managed Care – PPO | Admitting: Family Medicine

## 2012-10-24 VITALS — BP 120/73 | HR 84 | Temp 98.2°F | Resp 16

## 2012-10-24 DIAGNOSIS — E291 Testicular hypofunction: Secondary | ICD-10-CM

## 2012-10-24 MED ORDER — TESTOSTERONE CYPIONATE 100 MG/ML IM SOLN
200.0000 mg | Freq: Once | INTRAMUSCULAR | Status: AC
  Administered 2012-10-24: 200 mg via INTRAMUSCULAR

## 2012-10-24 NOTE — Progress Notes (Signed)
  Subjective:    Patient ID: Antonio [name], male    DOB: 07/05/1956, 57 y.o.   MRN: [id number]  HPI Testosterone injection only    Review of Systems     Objective:   Physical Exam        Assessment & Plan:

## 2012-11-10 ENCOUNTER — Ambulatory Visit (INDEPENDENT_AMBULATORY_CARE_PROVIDER_SITE_OTHER): Payer: BC Managed Care – PPO | Admitting: Family Medicine

## 2012-11-10 ENCOUNTER — Encounter: Payer: Self-pay | Admitting: Family Medicine

## 2012-11-10 VITALS — BP 137/66 | HR 89 | Temp 98.1°F | Resp 16 | Wt 173.0 lb

## 2012-11-10 DIAGNOSIS — E291 Testicular hypofunction: Secondary | ICD-10-CM

## 2012-11-10 MED ORDER — TESTOSTERONE CYPIONATE 200 MG/ML IM SOLN
150.0000 mg | Freq: Once | INTRAMUSCULAR | Status: AC
  Administered 2012-11-10: 150 mg via INTRAMUSCULAR

## 2012-11-27 ENCOUNTER — Ambulatory Visit: Payer: BC Managed Care – PPO | Admitting: Radiology

## 2012-11-27 DIAGNOSIS — E291 Testicular hypofunction: Secondary | ICD-10-CM

## 2012-11-27 DIAGNOSIS — R7989 Other specified abnormal findings of blood chemistry: Secondary | ICD-10-CM

## 2012-11-27 MED ORDER — TESTOSTERONE CYPIONATE 200 MG/ML IM SOLN
200.0000 mg | INTRAMUSCULAR | Status: DC
  Administered 2012-11-27 – 2013-01-08 (×2): 200 mg via INTRAMUSCULAR

## 2012-12-25 ENCOUNTER — Ambulatory Visit (INDEPENDENT_AMBULATORY_CARE_PROVIDER_SITE_OTHER): Payer: BC Managed Care – PPO | Admitting: Family Medicine

## 2012-12-25 ENCOUNTER — Encounter: Payer: Self-pay | Admitting: Family Medicine

## 2012-12-25 VITALS — BP 121/70 | HR 74 | Temp 97.1°F | Resp 16 | Ht 67.5 in | Wt 167.0 lb

## 2012-12-25 DIAGNOSIS — Z0000 Encounter for general adult medical examination without abnormal findings: Secondary | ICD-10-CM

## 2012-12-25 DIAGNOSIS — Z1159 Encounter for screening for other viral diseases: Secondary | ICD-10-CM

## 2012-12-25 DIAGNOSIS — Z760 Encounter for issue of repeat prescription: Secondary | ICD-10-CM

## 2012-12-25 DIAGNOSIS — Q984 Klinefelter syndrome, unspecified: Secondary | ICD-10-CM

## 2012-12-25 LAB — LIPID PANEL
Cholesterol: 175 mg/dL (ref 0–200)
Total CHOL/HDL Ratio: 4.6 Ratio
Triglycerides: 156 mg/dL — ABNORMAL HIGH (ref <150)

## 2012-12-25 LAB — CBC WITH DIFFERENTIAL/PLATELET
HCT: 46.6 % (ref 39.0–52.0)
Hemoglobin: 16.1 g/dL (ref 13.0–17.0)
Lymphocytes Relative: 27 % (ref 12–46)
Lymphs Abs: 1.5 10*3/uL (ref 0.7–4.0)
Monocytes Relative: 7 % (ref 3–12)
Neutro Abs: 3.4 10*3/uL (ref 1.7–7.7)
Neutrophils Relative %: 59 % (ref 43–77)
RBC: 5.38 MIL/uL (ref 4.22–5.81)

## 2012-12-25 LAB — POCT URINALYSIS DIPSTICK
Bilirubin, UA: NEGATIVE
Ketones, UA: NEGATIVE
Leukocytes, UA: NEGATIVE
Spec Grav, UA: <=1.005

## 2012-12-25 LAB — COMPREHENSIVE METABOLIC PANEL
Albumin: 4.1 g/dL (ref 3.5–5.2)
BUN: 15 mg/dL (ref 6–23)
Calcium: 9.4 mg/dL (ref 8.4–10.5)
Chloride: 104 mEq/L (ref 96–112)
Creat: 1.05 mg/dL (ref 0.50–1.35)
Glucose, Bld: 85 mg/dL (ref 70–99)
Potassium: 3.9 mEq/L (ref 3.5–5.3)

## 2012-12-25 MED ORDER — MONTELUKAST SODIUM 10 MG PO TABS: 10.0000 mg | ORAL_TABLET | Freq: Every day | ORAL | Status: DC

## 2012-12-25 MED ORDER — TESTOSTERONE CYPIONATE 200 MG/ML IM SOLN: 150.0000 mg | INTRAMUSCULAR | Status: DC

## 2012-12-25 NOTE — Patient Instructions (Signed)
Keeping you healthy  Get these tests  Blood pressure- Have your blood pressure checked once a year by your healthcare provider.  Normal blood pressure is 120/80  Weight- Have your body mass index (BMI) calculated to screen for obesity.  BMI is a measure of body fat based on height and weight. You can also calculate your own BMI at [email or web address].  Cholesterol- Have your cholesterol checked every year.  Diabetes- Have your blood sugar checked regularly if you have high blood pressure, high cholesterol, have a family history of diabetes or if you are overweight.  Screening for Colon Cancer- Colonoscopy starting at age 66.  Screening may begin sooner depending on your family history and other health conditions. Follow up colonoscopy as directed by your Gastroenterologist.  Screening for Prostate Cancer- Both blood work (PSA) and a rectal exam help screen for Prostate Cancer.  Screening begins at age 4 with African-American men and at age 73 with Caucasian men.  Screening may begin sooner depending on your family history.  Take these medicines  Aspirin- One aspirin daily can help prevent Heart disease and Stroke.  Flu shot- Every fall.  Tetanus- Every 10 years. Every adult   Zostavax- Once after the age of 72 to prevent Shingles. You have been given a prescription for this vaccine. Take it to [facility] or [employer] to be administered by the pharmacist.  Pneumonia shot- Once after the age of 52; if you are younger than 45, ask your healthcare provider if you need a Pneumonia shot.  Take these steps  Don't smoke- If you do smoke, talk to your doctor about quitting.  For tips on how to quit, go to www.smokefree.gov or call 1-800-QUIT-NOW.  Be physically active- Exercise 5 days a week for at least 30 minutes.  If you are not already physically active start slow and gradually work up to 30 minutes of moderate physical activity.  Examples of moderate activity include walking  briskly, mowing the yard, dancing, swimming, bicycling, etc.  Eat a healthy diet- Eat a variety of healthy food such as fruits, vegetables, low fat milk, low fat cheese, yogurt, lean meant, poultry, fish, beans, tofu, etc. For more information go to www.thenutritionsource.org  Drink alcohol in moderation- Limit alcohol intake to less than two drinks a day. Never drink and drive.  Dentist- Brush and floss twice daily; visit your dentist twice a year.  Depression- Your emotional health is as important as your physical health. If you're feeling down, or losing interest in things you would normally enjoy please talk to your healthcare provider.  Eye exam- Visit your eye doctor every year.  Safe sex- If you may be exposed to a sexually transmitted infection, use a condom.  Seat belts- Seat belts can save your life; always wear one.  Smoke/Carbon Monoxide detectors- These detectors need to be installed on the appropriate level of your home.  Replace batteries at least once a year.  Skin cancer- When out in the sun, cover up and use sunscreen 15 SPF or higher.  Violence- If anyone is threatening you, please tell your healthcare provider.  Living Will/ Health care power of attorney- Speak with your healthcare provider and family.

## 2012-12-26 ENCOUNTER — Encounter: Payer: Self-pay | Admitting: Family Medicine

## 2012-12-26 LAB — TESTOSTERONE, FREE, TOTAL, SHBG
Testosterone-% Free: 1.9 % (ref 1.6–2.9)
Testosterone: 128 ng/dL — ABNORMAL LOW (ref 300–890)

## 2012-12-26 LAB — FACTOR 5 LEIDEN

## 2012-12-26 NOTE — Progress Notes (Signed)
Subjective:    Patient ID: Antonio [name], male    DOB: 07-19-1956, 57 y.o.   MRN: [id number]  HPI  This 57 y.o. Cauc male is here for CPE and Testosterone injection which he receives every  2 weeks. He has Klinefelter's Syndrome w/ hypogonadism/ low testosterone.   Colonoscopy due in 2018.   Review of Systems  Constitutional: Negative.   HENT: Negative.   Eyes: Negative.   Respiratory: Negative for apnea, cough, chest tightness and shortness of breath.   Cardiovascular: Negative.   Gastrointestinal: Negative for nausea, vomiting, abdominal pain, diarrhea, constipation and blood in stool.  Endocrine: Negative.   Genitourinary: Negative for dysuria, urgency, frequency, hematuria, discharge, scrotal swelling, difficulty urinating and genital sores.  Musculoskeletal: Negative.   Skin: Negative.   Allergic/Immunologic: Negative.   Neurological: Negative for dizziness, syncope, weakness, numbness and headaches.  Hematological: Negative.   Psychiatric/Behavioral: Negative.        Objective:   Physical Exam  Nursing note and vitals reviewed. Constitutional: He is oriented to person, place, and time. Vital signs are normal. He appears well-developed and well-nourished. No distress.  HENT:  Head: Normocephalic and atraumatic.  Right Ear: Hearing, tympanic membrane, external ear and ear canal normal.  Left Ear: Hearing, tympanic membrane, external ear and ear canal normal.  Nose: Nose normal. No mucosal edema, rhinorrhea, nasal deformity or septal deviation.  Mouth/Throat: Uvula is midline, oropharynx is clear and moist and mucous membranes are normal. No oral lesions. Normal dentition. No dental caries.  Eyes: Conjunctivae, EOM and lids are normal. Pupils are equal, round, and reactive to light. No scleral icterus.  Professional vision care routinely.  Neck: Normal range of motion. Neck supple. No thyromegaly present.  Cardiovascular: Normal rate, regular rhythm, normal heart sounds  and intact distal pulses.  Exam reveals no gallop and no friction rub.   No murmur heard. Pulmonary/Chest: Effort normal and breath sounds normal. No respiratory distress. He exhibits no mass and no tenderness. Right breast exhibits no mass. Left breast exhibits no mass. Breasts are symmetrical.  Abdominal: Soft. Normal appearance and normal aorta. He exhibits no distension, no pulsatile midline mass and no mass. Bowel sounds are decreased. There is no hepatosplenomegaly. There is no tenderness. There is no guarding and no CVA tenderness. No hernia. Hernia confirmed negative in the right inguinal area and confirmed negative in the left inguinal area.  Genitourinary: Rectum normal and penis normal. Rectal exam shows no external hemorrhoid, no fissure, no mass, no tenderness and anal tone normal. Guaiac negative stool. Prostate is enlarged. Prostate is not tender. Right testis shows no mass, no swelling and no tenderness. Left testis shows no mass, no swelling and no tenderness.  Testes about 1/2 normal size.  Musculoskeletal: Normal range of motion. He exhibits no edema and no tenderness.  Lymphadenopathy:    He has no cervical adenopathy.       Right: No inguinal adenopathy present.       Left: No inguinal adenopathy present.  Neurological: He is alert and oriented to person, place, and time. He has normal reflexes. No cranial nerve deficit. He exhibits normal muscle tone. Coordination normal.  Skin: Skin is warm and dry. No rash noted. No erythema. No pallor.  Psychiatric: He has a normal mood and affect. His behavior is normal. Judgment and thought content normal.        Assessment & Plan:  Routine general medical examination at a health care facility - Plan: POCT urinalysis dipstick, Factor 5 leiden,  IFOBT POC (occult bld, rslt in office), PSA, Testosterone, free, total, Lipid panel, Comprehensive metabolic panel, CBC with Differential  Klinefelter's syndrome- Continue Testosterone injection  150 mg every 2 weeks.  Need for hepatitis C screening test - Plan: Hepatitis C antibody  Issue of repeat prescriptions  Meds ordered this encounter  Medications  . montelukast (SINGULAIR) 10 MG tablet    Sig: Take 1 tablet (10 mg total) by mouth at bedtime.    Dispense:  30 tablet    Refill:  11  . testosterone cypionate (DEPO-TESTOSTERONE) 200 MG/ML injection    Sig: Inject 0.75 mLs (150 mg total) into the muscle every 14 (fourteen) days.    Dispense:  10 mL    Refill:  5

## 2012-12-27 ENCOUNTER — Telehealth: Payer: Self-pay

## 2012-12-27 ENCOUNTER — Other Ambulatory Visit: Payer: Self-pay | Admitting: Family Medicine

## 2012-12-27 ENCOUNTER — Telehealth: Payer: Self-pay | Admitting: Family Medicine

## 2012-12-27 MED ORDER — TESTOSTERONE 20.25 MG/1.25GM (1.62%) TD GEL: 20.2500 mg | Freq: Every day | TRANSDERMAL | Status: DC

## 2012-12-27 NOTE — Telephone Encounter (Signed)
I have reviewed your lab results and released them to you. Please see comments associated with each lab. Unfortunately, Testosterone is below normal; resuming the every 2-week injection schedule may help get your level back into low-normal range where it was 5 months ago. Increasing your dose from 150 mg to 200 mg for a period may help get this level back up. When you come in for your next injection, let the clinical staff know if that is what you want to do.  You tested negative for Factor V Leiden; you are low risk for clotting problems as a result of taking Testosterone injections.   Contact the office if you have any questions or concerns.   Above was from Christiana message sent to patient. Patient states he did not open mychart because of recent fears regarding computer virus

## 2012-12-27 NOTE — Telephone Encounter (Signed)
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 He has been on testosterone injections since late 1970s. He reports an episode of near-syncope evaluated at [facility] last Sept 2013; he states diagnosis was possible TIA. Work-up was entirely negative. Pt is most concerned about cost of 10-ml vial of testosterone and the fact that it has to be discarded after 30 days (wasting more than 1/2 the vial). He cannot afford to waste medication in that manner. We had a lengthy discussion about resolving this issue. Pt would like to try Androgel (generic)  which is covered by his insurer.  He will start w/ 5 gm dose daily and come in for labs in 3 months.  At next visit, we will review the Factor V mutation and further testing as recommended by the lab. The pt is confident that he has a very low risk of thrombosis given the duration of treatment w/ testosterone.

## 2012-12-27 NOTE — Telephone Encounter (Signed)
Patient is wanting to speak with someone regarding his lab results.   Best#: [phone]

## 2012-12-30 ENCOUNTER — Encounter: Payer: Self-pay | Admitting: *Deleted

## 2013-01-08 ENCOUNTER — Ambulatory Visit (INDEPENDENT_AMBULATORY_CARE_PROVIDER_SITE_OTHER): Payer: BC Managed Care – PPO | Admitting: Physician Assistant

## 2013-01-08 VITALS — BP 108/80 | HR 84 | Temp 98.1°F | Resp 16 | Ht 67.0 in | Wt 165.6 lb

## 2013-01-08 DIAGNOSIS — Q984 Klinefelter syndrome, unspecified: Secondary | ICD-10-CM

## 2013-01-08 NOTE — Progress Notes (Signed)
  Subjective:    Patient ID: Antonio [name], male    DOB: [birth year], 57 y.o.   MRN: [id number]  HPI Present today for testosterone injection. Last 12/25/12. Ok to increase from .75 ml to 1 ml per Dr. Audria Nine note Ok to give 1ml per Rhoderick Moody PA-C  Review of Systems     Objective:   Physical Exam        Assessment & Plan:  1 ml given testosterone

## 2013-01-08 NOTE — Progress Notes (Signed)
Patient here for testosterone injection.  Ok to given 1 ml dose based on last encounter with Dr. Audria Nine. Patient not seen by a provider.

## 2013-01-16 DIAGNOSIS — D689 Coagulation defect, unspecified: Secondary | ICD-10-CM

## 2013-01-16 HISTORY — DX: Coagulation defect, unspecified: D68.9

## 2013-01-30 ENCOUNTER — Ambulatory Visit (INDEPENDENT_AMBULATORY_CARE_PROVIDER_SITE_OTHER): Payer: BC Managed Care – PPO

## 2013-01-30 DIAGNOSIS — E291 Testicular hypofunction: Secondary | ICD-10-CM

## 2013-01-30 MED ORDER — TESTOSTERONE CYPIONATE 200 MG/ML IM SOLN
150.0000 mg | Freq: Once | INTRAMUSCULAR | Status: AC
  Administered 2013-01-30: 150 mg via INTRAMUSCULAR

## 2013-02-04 ENCOUNTER — Encounter: Payer: Self-pay | Admitting: Family Medicine

## 2013-02-04 ENCOUNTER — Telehealth: Payer: Self-pay

## 2013-02-04 NOTE — Telephone Encounter (Signed)
Pt has a question about a discoloration underneath one of his toenails.  He didn't know if this was common or if he should come in and be seen. [phone]

## 2013-02-05 NOTE — Telephone Encounter (Signed)
Patient advised via mychart.

## 2013-02-08 ENCOUNTER — Ambulatory Visit: Payer: BC Managed Care – PPO | Admitting: Family Medicine

## 2013-02-08 ENCOUNTER — Ambulatory Visit (HOSPITAL_COMMUNITY)
Admission: RE | Admit: 2013-02-08 | Discharge: 2013-02-08 | Disposition: A | Discharge status: Home / Self Care | Payer: BC Managed Care – PPO | Source: Ambulatory Visit | Attending: Emergency Medicine | Admitting: Emergency Medicine

## 2013-02-08 VITALS — BP 111/69 | HR 87 | Temp 98.0°F | Resp 17 | Ht 68.0 in | Wt 169.0 lb

## 2013-02-08 DIAGNOSIS — M79604 Pain in right leg: Secondary | ICD-10-CM

## 2013-02-08 DIAGNOSIS — I82409 Acute embolism and thrombosis of unspecified deep veins of unspecified lower extremity: Secondary | ICD-10-CM

## 2013-02-08 DIAGNOSIS — I824Z9 Acute embolism and thrombosis of unspecified deep veins of unspecified distal lower extremity: Secondary | ICD-10-CM | POA: Insufficient documentation

## 2013-02-08 DIAGNOSIS — M79609 Pain in unspecified limb: Secondary | ICD-10-CM

## 2013-02-08 DIAGNOSIS — I82401 Acute embolism and thrombosis of unspecified deep veins of right lower extremity: Secondary | ICD-10-CM

## 2013-02-08 DIAGNOSIS — M7989 Other specified soft tissue disorders: Secondary | ICD-10-CM | POA: Insufficient documentation

## 2013-02-08 LAB — COMPREHENSIVE METABOLIC PANEL
Alkaline Phosphatase: 48 U/L (ref 39–117)
BUN: 13 mg/dL (ref 6–23)
CO2: 28 mEq/L (ref 19–32)
Creat: 1.22 mg/dL (ref 0.50–1.35)
Glucose, Bld: 74 mg/dL (ref 70–99)
Total Bilirubin: 1 mg/dL (ref 0.3–1.2)
Total Protein: 6.3 g/dL (ref 6.0–8.3)

## 2013-02-08 LAB — POCT CBC
MCH, POC: 30.8 pg (ref 27–31.2)
MCHC: 33.4 g/dL (ref 31.8–35.4)
MCV: 92.2 fL (ref 80–97)
MID (cbc): 0.5 (ref 0–0.9)
MPV: 8.2 fL (ref 0–99.8)
POC LYMPH PERCENT: 20.6 %L (ref 10–50)
POC MID %: 6.3 %M (ref 0–12)
Platelet Count, POC: 212 10*3/uL (ref 142–424)
RDW, POC: 13.9 %
WBC: 7.3 10*3/uL (ref 4.6–10.2)

## 2013-02-08 MED ORDER — RIVAROXABAN 15 MG PO TABS: 15.0000 mg | ORAL_TABLET | Freq: Two times a day (BID) | ORAL | Status: DC

## 2013-02-08 NOTE — Progress Notes (Signed)
VASCULAR LAB PRELIMINARY  PRELIMINARY  PRELIMINARY  PRELIMINARY  Right lower extremity venous Doppler completed.    Preliminary report:  There is acute, occlusive DVT noted in the posterior tibial and peroneal veins, coursing through to the popliteal and into the mid femoral vein.  All other veins appear thrombus free.  Javonnie Illescas, RVT 02/08/2013, 2:30 PM

## 2013-02-08 NOTE — Progress Notes (Signed)
[address] [place] [address]   Phone [phone]  Subjective:    Patient ID: Antonio [name], male    DOB: 30-Dec-1955, 57 y.o.   MRN: [id number]  HPI Pt presents to clinic with leg swelling that he 1st noticed this am.  He was working in the garden and the leg felt tight.  It is uncomfortable but he would not call it painful.  He has not traveled in a car or plane recently.  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  He got a scrape on his R calf with a nail  a couple of days ago, last tetanus was 2008.  The only change that occurred was that 4 weeks ago his testosterone was increased from 150mg  q14d to 200mg  q14d. He has had 2 injections of the higher dose, the last one 5/15.   Review of Systems  Constitutional: Negative for fever and diaphoresis.  Respiratory: Negative for cough, shortness of breath and wheezing.   Cardiovascular: Positive for leg swelling. Negative for chest pain.       Objective:   Physical Exam  Vitals reviewed. Constitutional: He is oriented to person, place, and time. He appears well-developed and well-nourished.  HENT:  Head: Normocephalic and atraumatic.  Right Ear: External ear normal.  Left Ear: External ear normal.  Eyes: Conjunctivae are normal.  Neck: Normal range of motion.  Cardiovascular: Normal rate, regular rhythm and normal heart sounds.   No murmur heard. Pulmonary/Chest: Effort normal and breath sounds normal. He has no wheezes.  Musculoskeletal:       Right lower leg: He exhibits tenderness, swelling (at 9 cm distal to tibial tuberosity R calf 38cm, L calf 35.5cm, neg Homan's) and edema (1+ pitting edema over shin).  Neurological: He is alert and oriented to person, place, and time.  Skin: Skin is warm and dry.  Psychiatric: He has a normal mood and affect. His behavior is normal. Judgment and thought content normal.       Assessment & Plan:  Leg swelling - I am concerned about a DVT due to swelling and  discomfort of his leg and his use of testosterone and heterozygous for Factor 5 mutation puts him at increased risk of blood clot.  We will get the doppler and then make the next decision - if he has a DVT he would like to try Xarelto and he thinks that he can swallow the pill.  If his doppler is neg we will treat for a cellulitis and f/u. - Plan: Lower Extremity Venous Duplex Right  Leg pain, diffuse, right  D/w Dr Conley Rolls while patient was in the office.    After the call report with DVT, I spoke with Dr Myra Gianotti regarding thrombolysis vs thrombolytic therapy.  Due to not severe swelling will start Xarelto 15mg  bid for 21 days.  We will recheck a duplex study in 4-5 days to see if there is progression or regression of clot, depending on the results we will continue with the Xarelto or change to coumadin.  Depending on the results of his clotting factors we will make a decision determining whether he should continue his testosterone, we may make a decision to refer to endo due to his complex medical history.  Answered patient's many questions.  Suggested pt rest for the next several days, he agreed to the next 2 days but plans to work on Tuesday.  If he develops any SOB or coughing or  CP he is the RTC or if his leg swelling increases.  Pt to stop his Motrin and ASA.  Clotting labs were ordered today after his duplex study.   Benny Lennert PA-C 02/08/2013 1:17 PM

## 2013-02-08 NOTE — Patient Instructions (Addendum)
Pt to go to [facility] ED and check-in for outpatient procedure at the vascular lab.  Pt is NOT to be seen in the ED.

## 2013-02-09 LAB — PROTIME-INR: INR: 0.97 (ref <1.50)

## 2013-02-09 LAB — APTT: aPTT: 31 seconds (ref 24–37)

## 2013-02-10 ENCOUNTER — Telehealth: Payer: Self-pay

## 2013-02-10 ENCOUNTER — Emergency Department (HOSPITAL_COMMUNITY)
Admission: EM | Admit: 2013-02-10 | Discharge: 2013-02-10 | Disposition: A | Discharge status: Home / Self Care | Payer: BC Managed Care – PPO | Attending: Emergency Medicine | Admitting: Emergency Medicine

## 2013-02-10 ENCOUNTER — Encounter (HOSPITAL_COMMUNITY): Payer: Self-pay

## 2013-02-10 DIAGNOSIS — Z79899 Other long term (current) drug therapy: Secondary | ICD-10-CM | POA: Insufficient documentation

## 2013-02-10 DIAGNOSIS — Z8669 Personal history of other diseases of the nervous system and sense organs: Secondary | ICD-10-CM | POA: Insufficient documentation

## 2013-02-10 DIAGNOSIS — J45909 Unspecified asthma, uncomplicated: Secondary | ICD-10-CM | POA: Insufficient documentation

## 2013-02-10 DIAGNOSIS — Q984 Klinefelter syndrome, unspecified: Secondary | ICD-10-CM | POA: Insufficient documentation

## 2013-02-10 DIAGNOSIS — R21 Rash and other nonspecific skin eruption: Secondary | ICD-10-CM | POA: Insufficient documentation

## 2013-02-10 DIAGNOSIS — Z8679 Personal history of other diseases of the circulatory system: Secondary | ICD-10-CM | POA: Insufficient documentation

## 2013-02-10 DIAGNOSIS — Z9089 Acquired absence of other organs: Secondary | ICD-10-CM | POA: Insufficient documentation

## 2013-02-10 DIAGNOSIS — Z8639 Personal history of other endocrine, nutritional and metabolic disease: Secondary | ICD-10-CM | POA: Insufficient documentation

## 2013-02-10 DIAGNOSIS — Z8719 Personal history of other diseases of the digestive system: Secondary | ICD-10-CM | POA: Insufficient documentation

## 2013-02-10 DIAGNOSIS — I82401 Acute embolism and thrombosis of unspecified deep veins of right lower extremity: Secondary | ICD-10-CM

## 2013-02-10 DIAGNOSIS — I82409 Acute embolism and thrombosis of unspecified deep veins of unspecified lower extremity: Secondary | ICD-10-CM | POA: Insufficient documentation

## 2013-02-10 DIAGNOSIS — Z862 Personal history of diseases of the blood and blood-forming organs and certain disorders involving the immune mechanism: Secondary | ICD-10-CM | POA: Insufficient documentation

## 2013-02-10 DIAGNOSIS — Z8739 Personal history of other diseases of the musculoskeletal system and connective tissue: Secondary | ICD-10-CM | POA: Insufficient documentation

## 2013-02-10 DIAGNOSIS — Z9889 Other specified postprocedural states: Secondary | ICD-10-CM | POA: Insufficient documentation

## 2013-02-10 DIAGNOSIS — K219 Gastro-esophageal reflux disease without esophagitis: Secondary | ICD-10-CM | POA: Insufficient documentation

## 2013-02-10 MED ORDER — ENOXAPARIN SODIUM 80 MG/0.8ML ~~LOC~~ SOLN
1.0000 mg/kg | Freq: Once | SUBCUTANEOUS | Status: AC
  Administered 2013-02-10: 75 mg via SUBCUTANEOUS
  Filled 2013-02-10: qty 0.8

## 2013-02-10 MED ORDER — DOXYCYCLINE CALCIUM 50 MG/5ML PO SYRP
200.0000 mg | ORAL_SOLUTION | Freq: Once | ORAL | Status: DC
  Filled 2013-02-10: qty 20

## 2013-02-10 NOTE — Telephone Encounter (Signed)
Thank you, I have called him to advise 

## 2013-02-10 NOTE — ED Notes (Signed)
Pt reports going to [facility] on Saturday and had an US done of his rt leg.  Pt reports that a "blockage" was found and was sent home taking a blood thinner.  Pt reports pain today to his rt groin area.  Pt denies any GU symptoms.

## 2013-02-10 NOTE — ED Notes (Signed)
Instructions reviewed and f/u information provided.  Questions asked and answered - verbalizes understanding.

## 2013-02-10 NOTE — Telephone Encounter (Signed)
I have called him, to find out if he needs a work note. He states he went in to find out if the clot has gotten larger, he is having groin pain now. He states he was given a lovenox injection. He states his pain is a 4-6 currently. 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 Please advise.

## 2013-02-10 NOTE — Telephone Encounter (Signed)
He is on Xarelto for the DVT.

## 2013-02-10 NOTE — Telephone Encounter (Signed)
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 He already has a follow up ultrasound schedule for the next 2-3 days to follow up on the initial study we will stick with that plan. Repeating the ultrasound this soon is unlikely to be of clinical significance unless he develops any chest pain, chest tightness, SOB, dyspnea, or coughing; then he is to go to the ER immediately. He is currently on Xarelto which is appropriate therapy.

## 2013-02-10 NOTE — ED Notes (Signed)
Pt also reports new onset of rt jaw pain this am.

## 2013-02-10 NOTE — Telephone Encounter (Signed)
Pt states he was seen Saturday (02/08/13) for leg pain and we were going to schedule a doppler. Advised pt referrals will follow up with him re: doppler. But pt was seen today at [facility] because pain worse, pt is wanting to talk with our doctors, regarding on going concerns and about ability to work tomorrow. Please call pt to advise

## 2013-02-10 NOTE — ED Provider Notes (Signed)
History     CSN: [id number]  Arrival date & time 02/10/13  1119   First MD Initiated Contact with Patient 02/10/13 1223      Chief Complaint  Patient presents with  . Groin Pain    (Consider location/radiation/quality/duration/timing/severity/associated sxs/prior treatment) Patient is a 57 y.o. male presenting with groin pain. The history is provided by the patient (the pt complains of groin pain.  he has  a dvt diagnosed sat).  Groin Pain This is a new problem. The current episode started 12 to 24 hours ago. The problem occurs constantly. The problem has not changed since onset.Pertinent negatives include no chest pain, no abdominal pain and no headaches. Nothing aggravates the symptoms. Nothing relieves the symptoms.    Past Medical History  Diagnosis Date  . GERD (gastroesophageal reflux disease)   . Asthma   . Klinefelter syndrome   . Hyperlipidemia   . Chronic headaches   . Eosinophilic esophagitis   . Seasonal allergies   . Cervical spine degeneration   . Cataract     Past Surgical History  Procedure Laterality Date  . Cholecystectomy      ERCP for cbd stones  . Tonsillectomy    . Cataract surgery      bilateral  . Hand tendon surgery      left, work comp  . Ercp    . Colonoscopy    . Upper gastrointestinal endoscopy      Family History  Problem Relation Age of Onset  . Colon cancer Neg Hx   . Irritable bowel syndrome Sister   . Allergies Sister   . Allergies Mother     History  Substance Use Topics  . Smoking status: Passive Smoke Exposure - Never Smoker    Types: Cigarettes  . Smokeless tobacco: Never Used  . Alcohol Use: No      Review of Systems  Constitutional: Negative for appetite change and fatigue.  HENT: Negative for congestion, sinus pressure and ear discharge.   Eyes: Negative for discharge.  Respiratory: Negative for cough.   Cardiovascular: Negative for chest pain.  Gastrointestinal: Negative for abdominal pain and diarrhea.   Genitourinary: Negative for frequency and hematuria.  Musculoskeletal: Negative for back pain.  Skin: Negative for rash.  Neurological: Negative for seizures and headaches.  Psychiatric/Behavioral: Negative for hallucinations.    Allergies  Codeine  Home Medications   Current Outpatient Rx  Name  Route  Sig  Dispense  Refill  . albuterol (PROVENTIL HFA;VENTOLIN HFA) 108 (90 BASE) MCG/ACT inhaler   Inhalation   Inhale 2 puffs into the lungs every 6 (six) hours as needed for wheezing.         Marland Kitchen ibuprofen (ADVIL,MOTRIN) 200 MG tablet   Oral   Take 400 mg by mouth every 6 (six) hours as needed. For pain         . loratadine (CLARITIN) 10 MG tablet   Oral   Take 10 mg by mouth daily.         . montelukast (SINGULAIR) 10 MG tablet   Oral   Take 1 tablet (10 mg total) by mouth at bedtime.   30 tablet   11   . Multiple Vitamin (MULTIVITAMIN WITH MINERALS) TABS   Oral   Take 1 tablet by mouth daily.         . pantoprazole (PROTONIX) 40 MG tablet   Oral   Take 40 mg by mouth daily.         . Rivaroxaban (  XARELTO) 15 MG TABS tablet   Oral   Take 1 tablet (15 mg total) by mouth 2 (two) times daily.   42 tablet   0   . Testosterone 20.25 MG/1.25GM (1.62%) GEL   Transdermal   Place 20.25 mg onto the skin daily. Apply 1 packet to clean dry intact skin on shoulders or arms every morning.   1.25 g   5     BP 132/77  Pulse 97  Temp(Src) 98.4 F (36.9 C) (Oral)  Resp 16  Ht 5\' 9"  (1.753 m)  Wt 168 lb (76.204 kg)  BMI 24.8 kg/m2  SpO2 94%  Physical Exam  Constitutional: He is oriented to person, place, and time. He appears well-developed.  HENT:  Head: Normocephalic.  Eyes: Conjunctivae and EOM are normal. No scleral icterus.  Neck: Neck supple. No thyromegaly present.  Cardiovascular: Normal rate and regular rhythm.  Exam reveals no gallop and no friction rub.   No murmur heard. Pulmonary/Chest: No stridor. He has no wheezes. He has no rales. He  exhibits no tenderness.  Abdominal: He exhibits no distension. There is no tenderness. There is no rebound.  Mild right groin tender  Musculoskeletal: Normal range of motion. He exhibits no edema.  Lymphadenopathy:    He has no cervical adenopathy.  Neurological: He is oriented to person, place, and time. Coordination normal.  Skin: Rash noted. No erythema.  Rash to right upper arm  Psychiatric: He has a normal mood and affect. His behavior is normal.    ED Course  Procedures (including critical care time)  Labs Reviewed - No data to display No results found.   1. DVT (deep venous thrombosis), right   2. Rash      I spoke with the hospitalist and we will give him a  lovenox shot and have him get his ultrasound of his right leg tomorrow as planned.  Will cover rash to right arm for tick exposure MDM          Benny Lennert, MD 02/10/13 1345

## 2013-02-11 ENCOUNTER — Ambulatory Visit (HOSPITAL_BASED_OUTPATIENT_CLINIC_OR_DEPARTMENT_OTHER)
Admission: RE | Admit: 2013-02-11 | Discharge: 2013-02-11 | Disposition: A | Discharge status: Home / Self Care | Payer: BC Managed Care – PPO | Source: Ambulatory Visit | Attending: Physician Assistant | Admitting: Physician Assistant

## 2013-02-11 ENCOUNTER — Telehealth: Payer: Self-pay

## 2013-02-11 ENCOUNTER — Other Ambulatory Visit: Payer: Self-pay | Admitting: Physician Assistant

## 2013-02-11 DIAGNOSIS — I82401 Acute embolism and thrombosis of unspecified deep veins of right lower extremity: Secondary | ICD-10-CM

## 2013-02-11 DIAGNOSIS — I824Y9 Acute embolism and thrombosis of unspecified deep veins of unspecified proximal lower extremity: Secondary | ICD-10-CM | POA: Insufficient documentation

## 2013-02-11 LAB — BETA-2 GLYCOPROTEIN ANTIBODIES: Beta-2-Glycoprotein I IgM: 46 M Units — ABNORMAL HIGH (ref <20)

## 2013-02-11 LAB — LUPUS ANTICOAGULANT PANEL
DRVVT: 43.9 secs — ABNORMAL HIGH (ref <42.9)
Lupus Anticoagulant: NOT DETECTED
PTTLA Confirmation: 4.3 secs (ref <8.0)

## 2013-02-11 LAB — CARDIOLIPIN ANTIBODIES, IGG, IGM, IGA: Anticardiolipin IgA: 3 APL U/mL (ref <22)

## 2013-02-11 NOTE — Telephone Encounter (Signed)
PT WOULD LIKE TO SPEAK WITH SARAH WEBER AS SOON AS SHE IS ABLE TO CALL HIM. HE DIDN'T WANT TO GO INTO DETAILS WITH ME PLEASE CALL [phone]

## 2013-02-11 NOTE — Telephone Encounter (Signed)
Called pt and D/W him doppler appt this evening and that we will get a call report afterward and call him at that time. Asked pt if he has any other concerns/questions he needs to ask Maralyn Sago about at this time. Pt wanted Maralyn Sago to know that he is not working today at the advice of [employer] provider but will be tomorrow. Pt is very anxious about his situation.

## 2013-02-11 NOTE — Telephone Encounter (Signed)
I spoke to patient yesterday, he is very concerned about his DVT, he is fearful it will dislodge. He was provided work note, he wants his [place] repeated today. Please see me . Amy

## 2013-02-11 NOTE — Telephone Encounter (Signed)
He has appt for venous duplex tonight at 6:30. They will call us with the report and we will go from there.  If pt has further concerns I will be happy to talk with him.

## 2013-02-12 ENCOUNTER — Telehealth: Payer: Self-pay | Admitting: Radiology

## 2013-02-12 NOTE — Telephone Encounter (Signed)
Patient called about the results from vascular lab, I advised him the radiologist will be in at the vascular lab at 5:00 pm to read the comparison. Maralyn Sago will contact him when she get the comparison results.

## 2013-02-12 NOTE — Telephone Encounter (Signed)
I have spoken to the vascular lab at [facility] and patients scan will be compared with previous one, and we will get an addendum on this. However, previous scan was done at vascular lab, radiology does not read those, so there may be an issue with getting an addendum, but the radiologist will review and see if he can advise. See me Amy

## 2013-02-13 ENCOUNTER — Encounter: Payer: Self-pay | Admitting: Physician Assistant

## 2013-02-13 ENCOUNTER — Telehealth: Payer: Self-pay

## 2013-02-13 NOTE — Telephone Encounter (Signed)
He was called with the results yesterday.

## 2013-02-14 ENCOUNTER — Ambulatory Visit (HOSPITAL_COMMUNITY)
Admission: RE | Admit: 2013-02-14 | Discharge: 2013-02-14 | Disposition: A | Discharge status: Home / Self Care | Payer: BC Managed Care – PPO | Source: Ambulatory Visit | Attending: Internal Medicine | Admitting: Internal Medicine

## 2013-02-14 ENCOUNTER — Ambulatory Visit (INDEPENDENT_AMBULATORY_CARE_PROVIDER_SITE_OTHER): Payer: BC Managed Care – PPO | Admitting: Internal Medicine

## 2013-02-14 ENCOUNTER — Telehealth: Payer: Self-pay | Admitting: Radiology

## 2013-02-14 VITALS — BP 122/70 | HR 84 | Temp 98.0°F | Resp 16 | Ht 69.0 in | Wt 166.0 lb

## 2013-02-14 DIAGNOSIS — M79609 Pain in unspecified limb: Secondary | ICD-10-CM

## 2013-02-14 DIAGNOSIS — I82409 Acute embolism and thrombosis of unspecified deep veins of unspecified lower extremity: Secondary | ICD-10-CM

## 2013-02-14 DIAGNOSIS — Q984 Klinefelter syndrome, unspecified: Secondary | ICD-10-CM

## 2013-02-14 DIAGNOSIS — I82401 Acute embolism and thrombosis of unspecified deep veins of right lower extremity: Secondary | ICD-10-CM

## 2013-02-14 DIAGNOSIS — D6851 Activated protein C resistance: Secondary | ICD-10-CM

## 2013-02-14 NOTE — Progress Notes (Signed)
Subjective:    Patient ID: Antonio [name], male    DOB: Jan 20, 1956, 57 y.o.   MRN: [id number]  HPI Has dvt right leg, see w/up and notes.Had f/up Duplex and clot had not changed. Today has less swelling but increased pain behind right knee. Has positive excess clotting studies, is on replacement testosterone.    Review of Systems     Objective:   Physical Exam  Vitals reviewed. Constitutional: He is oriented to person, place, and time. He appears well-developed and well-nourished.  Eyes: EOM are normal. No scleral icterus.  Cardiovascular: Normal rate, regular rhythm and normal heart sounds.   Pulmonary/Chest: Effort normal and breath sounds normal.  Musculoskeletal: He exhibits edema and tenderness.  Neurological: He is alert and oriented to person, place, and time. No cranial nerve deficit. He exhibits normal muscle tone. Coordination normal.  Skin: Skin is warm. No erythema.  Psychiatric: He has a normal mood and affect.   Right lower leg less swollen Tender behind right knee  Results for orders placed in visit on 02/08/13  APTT      Result Value Range   aPTT 31  24 - 37 seconds  FIBRINOGEN      Result Value Range   Fibrinogen 231  204 - 475 mg/dL  LUPUS ANTICOAGULANT PANEL      Result Value Range   PTT Lupus Anticoagulant 44.1 (*) 28.0 - 43.0 secs   PTTLA 4:1 Mix 43.0  28.0 - 43.0 secs   PTTLA Confirmation 4.3  <8.0 secs   DRVVT 43.9 (*) <42.9 secs   DRVVT 1:1 Mix 37.3  <42.9 secs   Drvvt confirmation NOT APPL  <1.11 Ratio   Lupus Anticoagulant NOT DETECTED  NOT DETECTED  ANTITHROMBIN III      Result Value Range   AntiThromb III Func 95  76 - 126 %  BETA-2 GLYCOPROTEIN ANTIBODIES      Result Value Range   Beta-2 Glyco I IgG 0  <20 G Units   Beta-2-Glycoprotein I IgM 46 (*) <20 M Units   Beta-2-Glycoprotein I IgA 0  <20 A Units  PROTEIN S, TOTAL      Result Value Range   Protein S Total 103  60 - 150 %  PROTIME-INR      Result Value Range   Prothrombin Time  12.9  11.6 - 15.2 seconds   INR 0.97  <1.50  CARDIOLIPIN ANTIBODIES, IGG, IGM, IGA      Result Value Range   Anticardiolipin IgG 4  <23 GPL U/mL   Anticardiolipin IgM 12 (*) <11 MPL U/mL   Anticardiolipin IgA 3  <22 APL U/mL  PROTEIN C, TOTAL      Result Value Range   Protein C, Total 92  72 - 160 %  HOMOCYSTEINE      Result Value Range   Homocysteine 8.0  4.0 - 15.4 umol/L  COMPREHENSIVE METABOLIC PANEL      Result Value Range   Sodium 140  135 - 145 mEq/L   Potassium 4.0  3.5 - 5.3 mEq/L   Chloride 102  96 - 112 mEq/L   CO2 28  19 - 32 mEq/L   Glucose, Bld 74  70 - 99 mg/dL   BUN 13  6 - 23 mg/dL   Creat 4.09  8.11 - 9.14 mg/dL   Total Bilirubin 1.0  0.3 - 1.2 mg/dL   Alkaline Phosphatase 48  39 - 117 U/L   AST 19  0 - 37 U/L  ALT 17  0 - 53 U/L   Total Protein 6.3  6.0 - 8.3 g/dL   Albumin 3.8  3.5 - 5.2 g/dL   Calcium 9.2  8.4 - 65.7 mg/dL  POCT CBC      Result Value Range   WBC 7.3  4.6 - 10.2 K/uL   Lymph, poc 1.5  0.6 - 3.4   POC LYMPH PERCENT 20.6  10 - 50 %L   MID (cbc) 0.5  0 - 0.9   POC MID % 6.3  0 - 12 %M   POC Granulocyte 5.3  2 - 6.9   Granulocyte percent 73.1  37 - 80 %G   RBC 4.74  4.69 - 6.13 M/uL   Hemoglobin 14.6  14.1 - 18.1 g/dL   HCT, POC 84.6  96.2 - 53.7 %   MCV 92.2  80 - 97 fL   MCH, POC 30.8  27 - 31.2 pg   MCHC 33.4  31.8 - 35.4 g/dL   RDW, POC 95.2     Platelet Count, POC 212  142 - 424 K/uL   MPV 8.2  0 - 99.8 fL        Assessment & Plan:  Will try to refer to vascular specialist now They agreed to repeat study today to r/o progression.

## 2013-02-14 NOTE — Patient Instructions (Addendum)
Go to [place] for repeat doppler study at 1:30 today, arrive at admitting at 1:00 north tower entrance, if no parking use the valet parking it is free.     Deep Vein Thrombosis   A deep vein thrombosis (DVT) is a blood clot that develops in a deep vein. A DVT is a clot in the deep, larger veins of the leg, arm, or pelvis. These are more dangerous than clots that might form in veins near the surface of the body. A DVT can lead to complications if the clot breaks off and travels in the bloodstream to the lungs.  A DVT can damage the valves in your leg veins, so that instead of flowing upwards, the blood pools in the lower leg. This is called post-thrombotic syndrome, and can result in pain, swelling, discoloration, and sores on the leg. Once identified, a DVT can be treated. It can also be prevented in some circumstances. Once you have had a DVT, you may be at increased risk for a DVT in the future. CAUSES Blood clots form in a vein for different reasons. Usually several things contribute to blood clots. Contributing factors include:  The flow of blood slows down.  The inside of the vein is damaged in some way.  The person has a condition that makes blood clot more easily. Some people are more likely than others to develop blood clots. That is because they have more factors that make clots likely. These are called risk factors. Risk factors include:   Older age, especially over 34 years old.  Having a history of blood clots. This means you have had one before. Or, it means that someone else in your family has had blood clots. You may have a genetic tendency to form clots.  Having major or lengthy surgery. This is especially true for surgery on the hip, knee, or belly (abdomen). Hip surgery is particularly high risk.  Breaking a hip or leg.  Sitting or lying still for a long time. This includes long distance travel, paralysis, or recovery from an illness or surgery.  Cancer, or cancer  treatment.  Having a long, thin tube (catheter) placed inside a vein during a medical procedure.  Being overweight (obese).  Pregnancy and childbirth. Hormone changes make the blood clot more easily during pregnancy. The fetus puts pressure on the veins of the pelvis. There is also risk of injury to veins during delivery or a caesarean. The risk is at its highest just after childbirth.  Medicines with the male hormone estrogen. This includes birth control pills and hormone replacement therapy.  Smoking.  Other circulation or heart problems. SYMPTOMS When a clot forms, it can either partially or totally block the blood flow in that vein. Symptoms of a DVT can include:  Swelling of the leg or arm, especially if one side is much worse.  Warmth and redness of the leg or arm, especially if one side is much worse.  Pain in an arm or leg. If the clot is in the leg, symptoms may be more noticeable or worse when standing or walking. The symptoms of a DVT that has traveled to the lungs (pulmonary embolism, PE) usually start suddenly, and include:  Shortness of breath.  Coughing.  Coughing up blood or blood-tinged phlegm.  Chest pain. The chest pain is often worse with deep breaths.  Rapid heartbeat. Anyone with these symptoms should get emergency medical treatment right away. Call your local emergency services (911 in [place] if you have  these symptoms. DIAGNOSIS If a DVT is suspected, your caregiver will take a full medical history and carry out a physical exam. Tests that also may be required include:  Blood tests, including studies of the clotting properties of the blood.  Ultrasonography to see if you have clots in your legs or lungs.  X-rays to show the flow of blood when dye is injected into the veins (venography).  Studies of your lungs, if you have any chest symptoms. PREVENTION  Exercise the legs regularly. Take a brisk 30 minute walk every day.  Maintain a weight that  is appropriate for your height.  Avoid sitting or lying in bed for long periods of time without moving your legs.  Women, particularly those over the age of 16, should consider the risks and benefits of taking estrogen medicines, including birth control pills.  Do not smoke, especially if you take estrogen medicines.  Long distance travel can increase your risk of DVT. You should exercise your legs by walking or pumping the muscles every hour.  In-hospital prevention:  Many of the risk factors above relate to situations that exist with hospitalization, either for illness, injury, or elective surgery.  Your caregiver will assess you for the need for venous thromboembolism prophylaxis when you are admitted to the hospital. If you are having surgery, your surgeon will assess you the day of or day after surgery.  Prevention may include medical and nonmedical measures. TREATMENT Treatment for DVT helps prevent death and disability. The most common treatment for DVT is blood thinning (anticoagulant) medicine, which reduces the blood's tendency to clot. Anticoagulants can stop new blood clots from forming and old ones from growing. They cannot dissolve existing clots. Your body does this by itself over time. Anticoagulants can be given by mouth, by intravenous (IV) access, or by injection. Your caregiver will determine the best program for you.  Heparin or related medicines (low molecular weight heparin) are usually the first treatment for a blood clot. They act quickly. However, they cannot be taken orally.  Heparin can cause a fall in a component of blood that stops bleeding and forms blood clots (platelets). You will be monitored with blood tests to be sure this does not occur.  Warfarin is an anticoagulant that can be swallowed (taken orally). It takes a few days to start working, so usually heparin or related medicines are used in combination. Once warfarin is working, heparin is usually  stopped.  Less commonly, clot dissolving drugs (thrombolytics) are used to dissolve a DVT. They carry a high risk of bleeding, so they are used mainly in severe cases, where a life or limb is threatened.  Very rarely, a blood clot in the leg needs to be removed surgically.  If you are unable to take anticoagulants, your caregiver may arrange for you to have a filter placed in a main vein in your belly (abdomen). This filter prevents clots from traveling to your lungs. HOME CARE INSTRUCTIONS  Take all medicines prescribed by your caregiver. Follow the directions carefully.  Warfarin. Most people will continue taking warfarin after hospital discharge. Your caregiver will advise you on the length of treatment (usually 3 6 months, sometimes lifelong).  Too much and too little warfarin are both dangerous. Too much warfarin increases the risk of bleeding. Too little warfarin continues to allow the risk for blood clots. While taking warfarin, you will need to have regular blood tests to measure your blood clotting time. These blood tests usually include both the  prothrombin time (PT) and international normalized ratio (INR) tests. The PT and INR results allow your caregiver to adjust your dose of warfarin. The dose can change for many reasons. It is critically important that you take warfarin exactly as prescribed, and that you have your PT and INR levels drawn exactly as directed.  Many foods, especially foods high in vitamin K can interfere with warfarin and affect the PT and INR results. Foods high in vitamin K include spinach, kale, broccoli, cabbage, collard and turnip greens, brussels sprouts, peas, cauliflower, seaweed, and parsley as well as beef and pork liver, green tea, and soybean oil. You should eat a consistent amount of foods high in vitamin K. Avoid major changes in your diet, or notify your caregiver before changing your diet. Arrange a visit with a dietitian to answer your  questions.  Many medicines can interfere with warfarin and affect the PT and INR results. You must tell your caregiver about any and all medicines you take, this includes all vitamins and supplements. Be especially cautious with aspirin and anti-inflammatory medicines. Ask your caregiver before taking these. Do not take or discontinue any prescribed or over-the-counter medicine except on the advice of your caregiver or pharmacist.  Warfarin can have side effects, primarily excessive bruising or bleeding. You will need to hold pressure over cuts for longer than usual. Your caregiver or pharmacist will discuss other potential side effects.  Alcohol can change the body's ability to handle warfarin. It is best to avoid alcoholic drinks or consume only very small amounts while taking warfarin. Notify your caregiver if you change your alcohol intake.  Notify your dentist or other caregivers before procedures.  Activity. Ask your caregiver how soon you can go back to normal activities. It is important to stay active to prevent blood clots. If you are on anticoagulant medicine, avoid contact sports.  Exercise. It is very important to exercise. This is especially important while traveling, sitting or standing for long periods of time. Exercise your legs by walking or by pumping the muscles frequently. Take frequent walks.  Compression stockings. These are tight elastic stockings that apply pressure to the lower legs. This pressure can help keep the blood in the legs from clotting. You may need to wear compressions stockings at home to help prevent a DVT.  Smoking. If you smoke, quit. Ask your caregiver for help with quitting smoking.  Learn as much as you can about DVT. Knowing more about the condition should help you keep it from coming back.  Wear a medical alert bracelet or carry a medical alert card. SEEK MEDICAL CARE IF:  You notice a rapid heartbeat.  You feel weaker or more tired than  usual.  You feel faint.  You notice increased bruising.  You feel your symptoms are not getting better in the time expected.  You believe you are having side effects of medicine. SEEK IMMEDIATE MEDICAL CARE IF:  You have chest pain.  You have trouble breathing.  You have new or increased swelling or pain in one leg.  You cough up blood.  You notice blood in vomit, in a bowel movement, or in urine. MAKE SURE YOU:  Understand these instructions.  Will watch your condition.  Will get help right away if you are not doing well or get worse. Document Released: 09/04/2005 Document Revised: 05/29/2012 Document Reviewed: 10/27/2010 [facility] Patient Information 2014 [place], [place].

## 2013-02-14 NOTE — Progress Notes (Addendum)
Right lower extremity venous duplex completed.  Right:  DVT noted in the femoral, popliteal, proximal peroneal, and proximal posterior tibial vein.  No evidence of superficial thrombosis.  No Baker's cyst.  Left:  Negative for DVT in the common femoral vein. Marland Kitchen

## 2013-02-14 NOTE — Telephone Encounter (Signed)
Patient advised the clot has gotten smaller he is advised to continue his current treatment. He does not need to repeat the doppler again next week. To you FYI

## 2013-02-16 DIAGNOSIS — I2699 Other pulmonary embolism without acute cor pulmonale: Secondary | ICD-10-CM

## 2013-02-16 HISTORY — DX: Other pulmonary embolism without acute cor pulmonale: I26.99

## 2013-02-17 ENCOUNTER — Telehealth: Payer: Self-pay | Admitting: Physician Assistant

## 2013-02-17 ENCOUNTER — Telehealth: Payer: Self-pay | Admitting: Internal Medicine

## 2013-02-17 NOTE — Telephone Encounter (Signed)
I spoke with pt  

## 2013-02-17 NOTE — Telephone Encounter (Signed)
Dr. Leone Payor are you aware of any interactions or contraindications with xeralto and protonix?

## 2013-02-17 NOTE — Telephone Encounter (Signed)
Spoke to pt - the swelling is better but still having some pain - plan to see a hematologist and endocrinologist to help decide next step.  Will plan on calling the vascular surgeons tomorrow about the next step.

## 2013-02-17 NOTE — Telephone Encounter (Signed)
I see no problems with these two drugs together.

## 2013-02-17 NOTE — Telephone Encounter (Signed)
Patient advised.

## 2013-02-18 NOTE — Telephone Encounter (Signed)
Pt will being seeing Hematologist who will take over the care of his DVT and review his blood work with him.  He will stay on the Xarelto 15mg  bid until he sees them and they will make the decision about repeat US etc at that appt.

## 2013-02-19 ENCOUNTER — Telehealth: Payer: Self-pay | Admitting: Hematology & Oncology

## 2013-02-19 ENCOUNTER — Encounter (HOSPITAL_BASED_OUTPATIENT_CLINIC_OR_DEPARTMENT_OTHER): Payer: Self-pay | Admitting: *Deleted

## 2013-02-19 ENCOUNTER — Emergency Department (HOSPITAL_BASED_OUTPATIENT_CLINIC_OR_DEPARTMENT_OTHER): Payer: BC Managed Care – PPO

## 2013-02-19 ENCOUNTER — Telehealth: Payer: Self-pay

## 2013-02-19 ENCOUNTER — Inpatient Hospital Stay (HOSPITAL_BASED_OUTPATIENT_CLINIC_OR_DEPARTMENT_OTHER)
Admission: EM | Admit: 2013-02-19 | Discharge: 2013-02-21 | DRG: 078 | Disposition: A | Discharge status: Home / Self Care | Payer: BC Managed Care – PPO | Attending: Internal Medicine | Admitting: Internal Medicine

## 2013-02-19 DIAGNOSIS — T7840XA Allergy, unspecified, initial encounter: Secondary | ICD-10-CM

## 2013-02-19 DIAGNOSIS — Q984 Klinefelter syndrome, unspecified: Secondary | ICD-10-CM

## 2013-02-19 DIAGNOSIS — D6859 Other primary thrombophilia: Secondary | ICD-10-CM

## 2013-02-19 DIAGNOSIS — I2782 Chronic pulmonary embolism: Secondary | ICD-10-CM

## 2013-02-19 DIAGNOSIS — K219 Gastro-esophageal reflux disease without esophagitis: Secondary | ICD-10-CM | POA: Diagnosis present

## 2013-02-19 DIAGNOSIS — J4520 Mild intermittent asthma, uncomplicated: Secondary | ICD-10-CM | POA: Diagnosis present

## 2013-02-19 DIAGNOSIS — M47812 Spondylosis without myelopathy or radiculopathy, cervical region: Secondary | ICD-10-CM

## 2013-02-19 DIAGNOSIS — I2699 Other pulmonary embolism without acute cor pulmonale: Principal | ICD-10-CM | POA: Diagnosis present

## 2013-02-19 DIAGNOSIS — R42 Dizziness and giddiness: Secondary | ICD-10-CM

## 2013-02-19 DIAGNOSIS — I82409 Acute embolism and thrombosis of unspecified deep veins of unspecified lower extremity: Secondary | ICD-10-CM | POA: Diagnosis present

## 2013-02-19 DIAGNOSIS — Z7901 Long term (current) use of anticoagulants: Secondary | ICD-10-CM

## 2013-02-19 DIAGNOSIS — I82509 Chronic embolism and thrombosis of unspecified deep veins of unspecified lower extremity: Secondary | ICD-10-CM | POA: Diagnosis present

## 2013-02-19 DIAGNOSIS — J45909 Unspecified asthma, uncomplicated: Secondary | ICD-10-CM | POA: Diagnosis present

## 2013-02-19 DIAGNOSIS — I82401 Acute embolism and thrombosis of unspecified deep veins of right lower extremity: Secondary | ICD-10-CM

## 2013-02-19 DIAGNOSIS — R5381 Other malaise: Secondary | ICD-10-CM

## 2013-02-19 DIAGNOSIS — R5383 Other fatigue: Secondary | ICD-10-CM

## 2013-02-19 DIAGNOSIS — K200 Eosinophilic esophagitis: Secondary | ICD-10-CM

## 2013-02-19 DIAGNOSIS — Z86718 Personal history of other venous thrombosis and embolism: Secondary | ICD-10-CM

## 2013-02-19 LAB — CBC WITH DIFFERENTIAL/PLATELET
Basophils Absolute: 0 10*3/uL (ref 0.0–0.1)
Eosinophils Relative: 4 % (ref 0–5)
HCT: 44.2 % (ref 39.0–52.0)
Hemoglobin: 16.3 g/dL (ref 13.0–17.0)
Lymphocytes Relative: 24 % (ref 12–46)
MCV: 83.9 fL (ref 78.0–100.0)
Monocytes Absolute: 0.6 10*3/uL (ref 0.1–1.0)
Monocytes Relative: 9 % (ref 3–12)
RDW: 13.7 % (ref 11.5–15.5)
WBC: 6.6 10*3/uL (ref 4.0–10.5)

## 2013-02-19 LAB — HEPARIN LEVEL (UNFRACTIONATED)
Heparin Unfractionated: 1.23 IU/mL — ABNORMAL HIGH (ref 0.30–0.70)
Heparin Unfractionated: 0.49 [IU]/mL (ref 0.30–0.70)

## 2013-02-19 LAB — BASIC METABOLIC PANEL
BUN: 17 mg/dL (ref 6–23)
CO2: 28 mEq/L (ref 19–32)
Calcium: 10.1 mg/dL (ref 8.4–10.5)
Creatinine, Ser: 1.2 mg/dL (ref 0.50–1.35)
Glucose, Bld: 95 mg/dL (ref 70–99)

## 2013-02-19 MED ORDER — PANTOPRAZOLE SODIUM 40 MG PO TBEC
40.0000 mg | DELAYED_RELEASE_TABLET | Freq: Every day | ORAL | Status: DC
  Administered 2013-02-19 – 2013-02-21 (×3): 40 mg via ORAL
  Filled 2013-02-19 (×3): qty 1

## 2013-02-19 MED ORDER — ACETAMINOPHEN 325 MG PO TABS
650.0000 mg | ORAL_TABLET | Freq: Four times a day (QID) | ORAL | Status: DC | PRN
  Administered 2013-02-20: 650 mg via ORAL
  Filled 2013-02-19: qty 2

## 2013-02-19 MED ORDER — HEPARIN (PORCINE) IN NACL 100-0.45 UNIT/ML-% IJ SOLN
1350.0000 [IU]/h | INTRAMUSCULAR | Status: DC
  Administered 2013-02-19: 1350 [IU]/h via INTRAVENOUS

## 2013-02-19 MED ORDER — HEPARIN (PORCINE) IN NACL 100-0.45 UNIT/ML-% IJ SOLN
18.0000 [IU]/kg/h | INTRAMUSCULAR | Status: DC
  Administered 2013-02-19: 18 [IU]/kg/h via INTRAVENOUS
  Filled 2013-02-19: qty 250

## 2013-02-19 MED ORDER — ALBUTEROL SULFATE HFA 108 (90 BASE) MCG/ACT IN AERS
2.0000 | INHALATION_SPRAY | RESPIRATORY_TRACT | Status: DC | PRN
  Filled 2013-02-19: qty 6.7

## 2013-02-19 MED ORDER — MONTELUKAST SODIUM 10 MG PO TABS
10.0000 mg | ORAL_TABLET | Freq: Every day | ORAL | Status: DC
  Administered 2013-02-19 – 2013-02-20 (×2): 10 mg via ORAL
  Filled 2013-02-19 (×3): qty 1

## 2013-02-19 MED ORDER — LORATADINE 10 MG PO TABS
10.0000 mg | ORAL_TABLET | Freq: Every day | ORAL | Status: DC
  Administered 2013-02-19 – 2013-02-21 (×3): 10 mg via ORAL
  Filled 2013-02-19 (×3): qty 1

## 2013-02-19 MED ORDER — ONDANSETRON HCL 4 MG/2ML IJ SOLN: 4.0000 mg | Freq: Four times a day (QID) | INTRAMUSCULAR | Status: DC | PRN

## 2013-02-19 MED ORDER — ALUM & MAG HYDROXIDE-SIMETH 200-200-20 MG/5ML PO SUSP: 30.0000 mL | Freq: Four times a day (QID) | ORAL | Status: DC | PRN

## 2013-02-19 MED ORDER — SODIUM CHLORIDE 0.9 % IV BOLUS (SEPSIS)
1000.0000 mL | Freq: Once | INTRAVENOUS | Status: AC
  Administered 2013-02-19: 1000 mL via INTRAVENOUS

## 2013-02-19 MED ORDER — ONDANSETRON HCL 4 MG PO TABS: 4.0000 mg | ORAL_TABLET | Freq: Four times a day (QID) | ORAL | Status: DC | PRN

## 2013-02-19 MED ORDER — ADULT MULTIVITAMIN W/MINERALS CH
1.0000 | ORAL_TABLET | Freq: Every day | ORAL | Status: DC
  Administered 2013-02-19 – 2013-02-21 (×3): 1 via ORAL
  Filled 2013-02-19 (×3): qty 1

## 2013-02-19 MED ORDER — IOHEXOL 350 MG/ML SOLN
80.0000 mL | Freq: Once | INTRAVENOUS | Status: AC | PRN
  Administered 2013-02-19: 80 mL via INTRAVENOUS

## 2013-02-19 MED ORDER — HEPARIN (PORCINE) IN NACL 100-0.45 UNIT/ML-% IJ SOLN
1100.0000 [IU]/h | INTRAMUSCULAR | Status: AC
  Administered 2013-02-19 – 2013-02-20 (×3): 1100 [IU]/h via INTRAVENOUS
  Filled 2013-02-19 (×2): qty 250

## 2013-02-19 MED ORDER — ACETAMINOPHEN 650 MG RE SUPP: 650.0000 mg | Freq: Four times a day (QID) | RECTAL | Status: DC | PRN

## 2013-02-19 MED ORDER — SODIUM CHLORIDE 0.9 % IV SOLN: 250.0000 mL | INTRAVENOUS | Status: DC | PRN

## 2013-02-19 MED ORDER — HEPARIN BOLUS VIA INFUSION
5000.0000 [IU] | Freq: Once | INTRAVENOUS | Status: AC
  Administered 2013-02-19: 5000 [IU] via INTRAVENOUS

## 2013-02-19 MED ORDER — SODIUM CHLORIDE 0.9 % IJ SOLN: 3.0000 mL | INTRAMUSCULAR | Status: DC | PRN

## 2013-02-19 MED ORDER — SODIUM CHLORIDE 0.9 % IV SOLN
INTRAVENOUS | Status: AC
  Administered 2013-02-19: 10:00:00 via INTRAVENOUS

## 2013-02-19 MED ORDER — SODIUM CHLORIDE 0.9 % IJ SOLN: 3.0000 mL | Freq: Two times a day (BID) | INTRAMUSCULAR | Status: DC

## 2013-02-19 MED ORDER — ALPRAZOLAM 0.25 MG PO TABS: 0.2500 mg | ORAL_TABLET | Freq: Three times a day (TID) | ORAL | Status: DC | PRN

## 2013-02-19 MED ORDER — SODIUM CHLORIDE 0.9 % IJ SOLN
3.0000 mL | Freq: Two times a day (BID) | INTRAMUSCULAR | Status: DC
  Administered 2013-02-19 – 2013-02-20 (×3): 3 mL via INTRAVENOUS

## 2013-02-19 MED ORDER — POLYETHYLENE GLYCOL 3350 17 G PO PACK
17.0000 g | PACK | Freq: Every day | ORAL | Status: DC | PRN
  Filled 2013-02-19: qty 1

## 2013-02-19 NOTE — Progress Notes (Signed)
ANTICOAGULATION CONSULT NOTE - F/U Consult  Pharmacy Consult for IV Heparin Indication: DVT/PE  Allergies  Allergen Reactions  . Codeine Other (See Comments)    REACTION: difficulty breathing, nausea    Patient Measurements: Height: 5\' 9"  (175.3 cm) Weight: 166 lb 3.6 oz (75.4 kg) IBW/kg (Calculated) : 70.7 Heparin Dosing Weight: 75.4 kg  Vital Signs: Temp: 98.9 F (37.2 C) (06/04 2128) Temp src: Oral (06/04 2128) BP: 116/58 mmHg (06/04 2128) Pulse Rate: 74 (06/04 2128)  Labs:  Recent Labs  02/19/13 0557 02/19/13 1405 02/19/13 2225  HGB 16.3  --   --   HCT 44.2  --   --   PLT 246  --   --   APTT 35  --   --   HEPARINUNFRC  --  1.23* 0.49  CREATININE 1.20  --   --   TROPONINI <0.30  --   --     Estimated Creatinine Clearance: 68.7 ml/min (by C-G formula based on Cr of 1.2).   Medical History: Past Medical History  Diagnosis Date  . GERD (gastroesophageal reflux disease)   . Asthma   . Klinefelter syndrome   . Hyperlipidemia   . Chronic headaches   . Eosinophilic esophagitis   . Seasonal allergies   . Cervical spine degeneration   . 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  CT showed multiple PE (acute and chronic PEs per radiologist) and patient was initiated on IV heparin and transferred to [facility].  Pharmacy to continue managing IV heparin here at [facility].  Patient received a 5000 unit IV bolus and infusion was initiated at 18 units/kg/hr (1350 units/hr) 1st HL = 1.23 (Xarelto still on board)   SCr 1.20, CrCl~68 ml/min  HL= 0.49 after 1100 units/hr.  No problems per RN  Goal of Therapy:  Heparin level 0.3-0.7 units/ml Monitor platelets by anticoagulation protocol: Yes   Plan:  1.  Continue heparin infusion at current rate of 1100 units/hr (11 ml/hr) 2.   Recheck HL in am.  Lorenza Evangelist 02/19/2013,11:05 PM

## 2013-02-19 NOTE — Telephone Encounter (Signed)
Lupita Leash at referring [phone] ext [phone] aware pt is in the hospital and will let me know if that changes [place] seeing the pt or not.

## 2013-02-19 NOTE — ED Notes (Signed)
Pt ransported to [facility] and stable upon transport,

## 2013-02-19 NOTE — Telephone Encounter (Signed)
I spoke with patient - got terrible pain in his thigh last night while sleeping - took tylenol and tried to go back to sleep - chest then got tight and he made a phone call to the insurance company nurse line and was told to go to the ED which he did.  He is currently in the hospital waiting on a hematologist consult.  He is on heparin and has been told he will be hospitalized for 2 days.    Endo does not want to see him but so will do a referral to urology.

## 2013-02-19 NOTE — Telephone Encounter (Signed)
LMOM of info and to call me back with any questions

## 2013-02-19 NOTE — Progress Notes (Signed)
Spoke with Dr. 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  Spoke with Dr. Darnelle Catalan regarding patient's care who agree with continuing heparin for now.  He requested admission at [facility] and will consult on the patient when the patient arrives.

## 2013-02-19 NOTE — ED Notes (Signed)
[facility] at bedside preparing for transport 

## 2013-02-19 NOTE — Telephone Encounter (Signed)
Dr Perrin Maltese spoke with the vascular surgeons and it is out understanding that they do not want to see him so we will continue with out plan to refer to hematology.

## 2013-02-19 NOTE — ED Notes (Signed)
Report called to Irving Burton, RN [address]

## 2013-02-19 NOTE — ED Notes (Signed)
Patient transported to CT via stretcher.

## 2013-02-19 NOTE — ED Provider Notes (Addendum)
History     CSN: [id number]  Arrival date & time 02/19/13  4098   First MD Initiated Contact with Patient 02/19/13 [phone]      Chief Complaint  Patient presents with  . Chest Pain    (Consider location/radiation/quality/duration/timing/severity/associated sxs/prior treatment) HPI Comments: Patient with a history of a recently diagnosed DVT to his right leg presents with chest tightness. He was diagnosed on May 24 with a DVT in his femoral vein extending into the calf on his right leg. He states tonight about 2 AM he developed some tightness across his chest. He felt some mild shortness of breath but felt this was related to his asthma and took an increased dose inhaler. He currently does not feel short of breath. He had no associated nausea or diaphoresis. He describes a tightness is across his chest but otherwise nonradiating. He denies any history of chest pain in the past. He denies any history of heart problems in the past. There's no family history of early heart disease. He states his cholesterol is normal and he denies any history of hypertension. He denies any tobacco use. He was diagnosed with a DVT it was felt to be related to testosterone injections in combination with a factor V Leiden deficiency. He has been on [place] since then. He's also concerned because he is having some sharp pains to his leg. He's had some intermittent pains to his right leg since the DVT but he feels like there is a sensation of blood trying to push through the veins in his right thigh and calf. He states overall the swelling has gone down his leg. He also feels like he has some anxiety and stress about this blood clot and is worried that he might have a clot that has gone to his lungs. He feels that the chest pain may be stress and anxiety related.  Patient is a 57 y.o. male presenting with chest pain.  Chest Pain Associated symptoms: shortness of breath   Associated symptoms: no abdominal pain, no back pain,  no cough, no diaphoresis, no dizziness, no fatigue, no fever, no headache, no nausea, no numbness, not vomiting and no weakness     Past Medical History  Diagnosis Date  . GERD (gastroesophageal reflux disease)   . Asthma   . Klinefelter syndrome   . Hyperlipidemia   . Chronic headaches   . Eosinophilic esophagitis   . Seasonal allergies   . Cervical spine degeneration   . Cataract     Past Surgical History  Procedure Laterality Date  . Cholecystectomy      ERCP for cbd stones  . Tonsillectomy    . Cataract surgery      bilateral  . Hand tendon surgery      left, work comp  . Ercp    . Colonoscopy    . Upper gastrointestinal endoscopy      Family History  Problem Relation Age of Onset  . Colon cancer Neg Hx   . Irritable bowel syndrome Sister   . Allergies Sister   . Allergies Mother     History  Substance Use Topics  . Smoking status: Passive Smoke Exposure - Never Smoker    Types: Cigarettes  . Smokeless tobacco: Never Used  . Alcohol Use: No      Review of Systems  Constitutional: Negative for fever, chills, diaphoresis and fatigue.  HENT: Negative for congestion, rhinorrhea and sneezing.   Eyes: Negative.   Respiratory: Positive for shortness of breath.  Negative for cough and chest tightness.   Cardiovascular: Positive for chest pain and leg swelling.  Gastrointestinal: Negative for nausea, vomiting, abdominal pain, diarrhea and blood in stool.  Genitourinary: Negative for frequency, hematuria, flank pain and difficulty urinating.  Musculoskeletal: Negative for back pain and arthralgias.  Skin: Negative for rash.  Neurological: Positive for light-headedness (chronic and unchanged for patient). Negative for dizziness, speech difficulty, weakness, numbness and headaches.    Allergies  Codeine  Home Medications   Current Outpatient Rx  Name  Route  Sig  Dispense  Refill  . loratadine (CLARITIN) 10 MG tablet   Oral   Take 10 mg by mouth daily.          . montelukast (SINGULAIR) 10 MG tablet   Oral   Take 1 tablet (10 mg total) by mouth at bedtime.   30 tablet   11   . Multiple Vitamin (MULTIVITAMIN WITH MINERALS) TABS   Oral   Take 1 tablet by mouth daily.         . pantoprazole (PROTONIX) 40 MG tablet   Oral   Take 40 mg by mouth daily.         . Rivaroxaban (XARELTO) 15 MG TABS tablet   Oral   Take 1 tablet (15 mg total) by mouth 2 (two) times daily.   42 tablet   0   . Testosterone 20.25 MG/1.25GM (1.62%) GEL   Transdermal   Place 20.25 mg onto the skin daily. Apply 1 packet to clean dry intact skin on shoulders or arms every morning.   1.25 g   5     BP 118/53  Pulse 86  Temp(Src) 98.1 F (36.7 C) (Oral)  Resp 18  Ht 5\' 9"  (1.753 m)  Wt 165 lb (74.844 kg)  BMI 24.36 kg/m2  SpO2 100%  Physical Exam  Constitutional: He is oriented to person, place, and time. He appears well-developed and well-nourished.  HENT:  Head: Normocephalic and atraumatic.  Mouth/Throat: Oropharynx is clear and moist.  Eyes: Pupils are equal, round, and reactive to light.  Neck: Normal range of motion. Neck supple.  Cardiovascular: Normal rate, regular rhythm and normal heart sounds.   Pulmonary/Chest: Effort normal and breath sounds normal. No respiratory distress. He has no wheezes. He has no rales. He exhibits no tenderness.  Abdominal: Soft. Bowel sounds are normal. There is no tenderness. There is no rebound and no guarding.  Musculoskeletal: Normal range of motion. He exhibits edema (mild edema of the right leg as compared to the left).  Normal color/warmth to extremities  Lymphadenopathy:    He has no cervical adenopathy.  Neurological: He is alert and oriented to person, place, and time.  Skin: Skin is warm and dry. No rash noted.  Psychiatric: He has a normal mood and affect.    ED Course  Procedures (including critical care time)  Labs Reviewed  CBC WITH DIFFERENTIAL - Abnormal; Notable for the following:     MCHC 36.9 (*)    All other components within normal limits  BASIC METABOLIC PANEL - Abnormal; Notable for the following:    GFR calc non Af Amer 66 (*)    GFR calc Af Amer 76 (*)    All other components within normal limits  TROPONIN I   Ct Angio Chest Pe W/cm &/or Wo Cm  02/19/2013   *RADIOLOGY REPORT*  Clinical Data: Chest pain.  History of deep venous thrombosis. Evaluate for pulmonary embolism.  CT ANGIOGRAPHY CHEST  Technique:  Multidetector CT imaging of the chest using the standard protocol during bolus administration of intravenous contrast. Multiplanar reconstructed images including MIPs were obtained and reviewed to evaluate the vascular anatomy.  Contrast: 80mL OMNIPAQUE IOHEXOL 350 MG/ML SOLN  Comparison: No priors.  Findings:  Mediastinum: There are multiple filling defects in the pulmonary arteries bilaterally, compatible with pulmonary emboli.  Several of these are central while others are eccentric, suggesting a combination of acute and chronic thromboembolic disease.  The largest burden of clot extends from the distal right main pulmonary artery into the right lower lobe pulmonary artery, terminating in proximal segmental sized branches.  All of these filling defects appear to be nonocclusive at this time. Heart size is normal. There is no significant pericardial fluid, thickening or pericardial calcification. No pathologically enlarged mediastinal or hilar lymph nodes. Esophagus is unremarkable in appearance.  Lungs/Pleura: Mild dependent atelectasis in the lower lobes of the lungs bilaterally.  No acute consolidative airspace disease.  No pleural effusions.  No suspicious appearing pulmonary nodules or masses.  Upper Abdomen: Status post cholecystectomy.  Musculoskeletal: There are no aggressive appearing lytic or blastic lesions noted in the visualized portions of the skeleton.  IMPRESSION: 1.  Study is positive for pulmonary embolus with evidence of both acute and chronic  thromboembolic disease in the lungs bilaterally, as above.  At this time, all filling defects in the pulmonary arteries appear to be nonocclusive.  No evidence of pulmonary hemorrhage to suggest a frank pulmonary infarction at this time. 2.  Status post cholecystectomy.  Critical Value/emergent results were called by telephone at the time of interpretation on 02/19/2013 at 06:41 a.m. to Dr. Fredderick Phenix, who verbally acknowledged these results.   Original Report Authenticated By: Trudie Reed, M.D.     Date: 02/19/2013  Rate: 82  Rhythm: normal sinus rhythm  QRS Axis: normal  Intervals: normal  ST/T Wave abnormalities: normal  Conduction Disutrbances:none  Narrative Interpretation:   Old EKG Reviewed: unchanged   1. Pulmonary emboli       MDM  Patient presents with chest tightness. He has no other associated symptoms. I did review his chart and his last cholesterol values were slightly elevated but he has no other is factors for coronary artery disease other than factor V Leiden deficiency although he currently is on Xarelto. Will check labs and CT angio.  CT shows multiple PE, discussed with radiologist who feels that patient is a combination of acute and chronic PEs. Will start patient on heparin and consult hospitalist to transfer to [facility]. Currently pain she is maintaining normal oxygen saturation of 100% on room air.  Per hospitalist/hematology request, pt will be admitted to [facility].     Rolan Bucco, MD 02/19/13 1610  Rolan Bucco, MD 02/19/13 [phone]

## 2013-02-19 NOTE — H&P (Signed)
[facility] History and Physical  Antonio [name]:[id number] DOB: 06-13-1956 DOA: 02/19/2013  Referring physician: Dr. Rolan Bucco PCP: Tonye Pearson, MD   Chief Complaint: Chest tightness.   History of Present Illness: Antonio [name] is an 57 y.o. male with a PMH of Klinefelters syndrome on chronic testosterone therapy, factor V Leiden (tested prior to increasing testosterone dosage 01/09/12) and DVT of the RLE diagnosed 02/08/13 when he developed abrupt onset of RLE swelling. He subsequently was treated with Xarelto and presented to the [facility] ER on 02/19/13 with a sensation of the RLE clot moving that awakened him from sleep followed by chest pressure and some dyspnea.  He used his ProAir inhaler without relief.  Upon initial evaluation in the ER, a CT scan was done which confirmed pulmonary emboli.  Symptoms also were associated with some lightheadedness.  Review of Systems: Constitutional: No fever, no chills;  Appetite normal; No weight loss, no weight gain.  HEENT: No blurry vision, no diplopia, no pharyngitis, no dysphagia CV: + chest pain, no palpitations.  Resp: + SOB, no cough. GI: + nausea, no vomiting, no diarrhea, no melena, no hematochezia.  GU: No dysuria, no hematuria.  MSK: no myalgias, + neck arthralgias.  Neuro:  No headache, no focal neurological deficits, no history of seizures.  Psych: No depression, no anxiety.  Endo: No thyroid disease, no DM, no heat intolerance, no cold intolerance, no polyuria, no polydipsia  Skin: No rashes, no skin lesions.  Heme: No easy bruising, no history of blood diseases.  Past Medical History Past Medical History  Diagnosis Date  . GERD (gastroesophageal reflux disease)   . Asthma   . Klinefelter syndrome   . Hyperlipidemia   . Chronic headaches   . Eosinophilic esophagitis   . Seasonal allergies   . Cervical spine degeneration   . Cataract      Past Surgical History Past Surgical History  Procedure Laterality Date  .  Cholecystectomy      ERCP for cbd stones  . Tonsillectomy    . Cataract surgery      bilateral  . Hand tendon surgery      left, work comp  . Ercp    . Colonoscopy    . Upper gastrointestinal endoscopy       Social History: History   Social History  . Marital Status: Married    Spouse Name: [name]    Number of Children: 0  . Years of Education: 16   Occupational History  . DRIVER    Social History Main Topics  . Smoking status: Passive Smoke Exposure - Never Smoker    Types: Cigarettes  . Smokeless tobacco: Never Used  . Alcohol Use: No  . Drug Use: No  . Sexually Active: Yes -- Male partner(s)   Other Topics Concern  . Not on file   Social History Narrative   Lives with his wife. She has been diagnosed with COPD and continues to smoke.    Family History:  Family History  Problem Relation Age of Onset  . Colon cancer Neg Hx   . Irritable bowel syndrome Sister   . Allergies Sister   . Allergies Mother     Allergies: Codeine  Meds: Prior to Admission medications   Medication Sig Start Date End Date Taking? Authorizing Provider  loratadine (CLARITIN) 10 MG tablet Take 10 mg by mouth daily.   Yes Historical Provider, MD  montelukast (SINGULAIR) 10 MG tablet Take 1 tablet (10 mg total)  by mouth at bedtime. 12/25/12  Yes Maurice March, MD  pantoprazole (PROTONIX) 40 MG tablet Take 40 mg by mouth daily.   Yes Historical Provider, MD  Rivaroxaban (XARELTO) 15 MG TABS tablet Take 1 tablet (15 mg total) by mouth 2 (two) times daily. 02/08/13  Yes Morrell Riddle, PA-C    Physical Exam: Filed Vitals:   02/19/13 0519 02/19/13 0715 02/19/13 0854 02/19/13 0946  BP: 118/53 118/57 110/86 121/63  Pulse: 86 77 83 76  Temp: 98.1 F (36.7 C)   98.1 F (36.7 C)  TempSrc: Oral   Oral  Resp: 18 16 16 18   Height: 5\' 9"  (1.753 m)   5\' 9"  (1.753 m)  Weight: 74.844 kg (165 lb)   75.4 kg (166 lb 3.6 oz)  SpO2: 100% 100% 100% 100%     Physical Exam: Blood pressure  121/63, pulse 76, temperature 98.1 F (36.7 C), temperature source Oral, resp. rate 18, height 5\' 9"  (1.753 m), weight 75.4 kg (166 lb 3.6 oz), SpO2 100.00%. Gen: No acute distress. Head: Normocephalic, atraumatic. Eyes: PERRL, EOMI, sclerae nonicteric. Mouth: Oropharynx clear. Neck: Supple, no thyromegaly, no lymphadenopathy, no jugular venous distention. Chest: Lungs CTAB. CV: Heart sounds are regular, no M/R/G. Abdomen: Soft, nontender, nondistended with normal active bowel sounds. Extremities: RLE swollen compared to left. Skin: Warm and dry. Neuro: Alert and oriented times 3; cranial nerves II through XII grossly intact. Psych: Mood and affect normal.  Labs on Admission:  Basic Metabolic Panel:  Recent Labs Lab 02/19/13 0557  NA 140  K 3.5  CL 102  CO2 28  GLUCOSE 95  BUN 17  CREATININE 1.20  CALCIUM 10.1   CBC:  Recent Labs Lab 02/19/13 0557  WBC 6.6  NEUTROABS 4.2  HGB 16.3  HCT 44.2  MCV 83.9  PLT 246   Cardiac Enzymes:  Recent Labs Lab 02/19/13 0557  TROPONINI <0.30    Radiological Exams on Admission: Ct Angio Chest Pe W/cm &/or Wo Cm  02/19/2013   *RADIOLOGY REPORT*  Clinical Data: Chest pain.  History of deep venous thrombosis. Evaluate for pulmonary embolism.  CT ANGIOGRAPHY CHEST  Technique:  Multidetector CT imaging of the chest using the standard protocol during bolus administration of intravenous contrast. Multiplanar reconstructed images including MIPs were obtained and reviewed to evaluate the vascular anatomy.  Contrast: 80mL OMNIPAQUE IOHEXOL 350 MG/ML SOLN  Comparison: No priors.  Findings:  Mediastinum: There are multiple filling defects in the pulmonary arteries bilaterally, compatible with pulmonary emboli.  Several of these are central while others are eccentric, suggesting a combination of acute and chronic thromboembolic disease.  The largest burden of clot extends from the distal right main pulmonary artery into the right lower lobe  pulmonary artery, terminating in proximal segmental sized branches.  All of these filling defects appear to be nonocclusive at this time. Heart size is normal. There is no significant pericardial fluid, thickening or pericardial calcification. No pathologically enlarged mediastinal or hilar lymph nodes. Esophagus is unremarkable in appearance.  Lungs/Pleura: Mild dependent atelectasis in the lower lobes of the lungs bilaterally.  No acute consolidative airspace disease.  No pleural effusions.  No suspicious appearing pulmonary nodules or masses.  Upper Abdomen: Status post cholecystectomy.  Musculoskeletal: There are no aggressive appearing lytic or blastic lesions noted in the visualized portions of the skeleton.  IMPRESSION: 1.  Study is positive for pulmonary embolus with evidence of both acute and chronic thromboembolic disease in the lungs bilaterally, as above.  At  this time, all filling defects in the pulmonary arteries appear to be nonocclusive.  No evidence of pulmonary hemorrhage to suggest a frank pulmonary infarction at this time. 2.  Status post cholecystectomy.  Critical Value/emergent results were called by telephone at the time of interpretation on 02/19/2013 at 06:41 a.m. to Dr. Fredderick Phenix, who verbally acknowledged these results.   Original Report Authenticated By: Trudie Reed, M.D.    EKG: Independently reviewed. NSR at 82 beats per minute.  Assessment/Plan Principal Problem:   Acute and chronic pulmonary embolism in a patient with Factor V Leiden -On heparin IV, seemingly failed outpatient therapy with Xarelto. -Hematology consult for assistance in long-term anti-coagulation treatment recommendations. Active Problems:   Extrinsic asthma, unspecified -Bronchodilators PRN.   GERD -Continue PPI.   Klinefelter's syndrome -Managed with testosterone replacement therapy.   DVT (deep venous thrombosis) -On heparin drip.   Code Status: Full. Family Communication: Dequarius Jeffries  (wife) Disposition Plan: Home when stable.  Time spent: 70 minutes.  RAMA,CHRISTINA [facility] Pager [phone]  If 7PM-7AM, please contact night-coverage [email or web address] Password [email or web address] 02/19/2013, 1:57 PM

## 2013-02-19 NOTE — Progress Notes (Addendum)
ANTICOAGULATION CONSULT NOTE - Initial Consult  Pharmacy Consult for IV Heparin Indication: DVT/PE  Allergies  Allergen Reactions  . Codeine Other (See Comments)    REACTION: difficulty breathing, nausea    Patient Measurements: Height: 5\' 9"  (175.3 cm) Weight: 166 lb 3.6 oz (75.4 kg) IBW/kg (Calculated) : 70.7 Heparin Dosing Weight: 75.4 kg  Vital Signs: Temp: 98.1 F (36.7 C) (06/04 0946) Temp src: Oral (06/04 0946) BP: 121/63 mmHg (06/04 0946) Pulse Rate: 76 (06/04 0946)  Labs:  Recent Labs  02/19/13 0557  HGB 16.3  HCT 44.2  PLT 246  APTT 35  CREATININE 1.20  TROPONINI <0.30    Estimated Creatinine Clearance: 68.7 ml/min (by C-G formula based on Cr of 1.2).   Medical History: Past Medical History  Diagnosis Date  . GERD (gastroesophageal reflux disease)   . Asthma   . Klinefelter syndrome   . Hyperlipidemia   . Chronic headaches   . Eosinophilic esophagitis   . Seasonal allergies   . Cervical spine degeneration   . Cataract       Assessment: 16 yoM recently diagnosed on May 24th with a DVT in right femoral vein felt to be related to testosterone injections plus Factor V Leiden, discharged on Xarelto and presented to [facility] with chest tightness.  CT showed multiple PE (acute and chronic PEs per radiologist) and patient was initiated on IV heparin and transferred to [facility].  Pharmacy to continue managing IV heparin here at [facility].  Patient received a 5000 unit IV bolus and infusion was initiated at 18 units/kg/hr (1350 units/hr) at 0700 this AM.    SCr 1.20, CrCl~68 ml/min  Baseline CBC, aPTT WNL.  No baseline INR but would be elevated due to Xarelto therapy.  Patient reports being compliant with Xarelto and last dose taken 6/3.  Goal of Therapy:  Heparin level 0.3-0.7 units/ml Monitor platelets by anticoagulation protocol: Yes   Plan:  1.  Continue heparin infusion at current rate of 1350 units/hr (13.5 mL/hr). 2.  Check a heparin level at 1400.   Heparin level may be difficult to interpret due to Xarelto.  There are currently no guidelines for interpreting heparin levels in patients on Xarelto. 3.  Daily heparin levels and CBC.  Clance Boll 02/19/2013,11:12 AM   Addendum: 02/19/2013 2:34 PM Heparin level 1.23 with infusion running at 1350 units/hr (plus Xarelto in patient's system, which can elevate heparin level). Plan: Hold heparin x 1 hour then resume heparin infusion at 1100 units/hr (11 mL/hr).  Recheck heparin level 6 hours following restart of heparin at 2200.  Clance Boll, PharmD, BCPS Pager: [phone] 02/19/2013 2:42 PM

## 2013-02-19 NOTE — ED Notes (Signed)
Pt sts he awoke this am with a feeling of blood pushing through his DVT and tightness in his chest.

## 2013-02-19 NOTE — Care Management Note (Addendum)
    Page 1 of 1   02/21/2013     3:20:43 PM   CARE MANAGEMENT NOTE 02/21/2013  Patient:  Antonio [name], Antonio [name]   Account Number:  [id number]  Date Initiated:  02/19/2013  Documentation initiated by:  Lanier Clam  Subjective/Objective Assessment:   ADMITTED W/PE.ZO:XWRU,EAVWUJ.     Action/Plan:   FROM HOME W/SPOUSE.HAS PCP,PHARMACY.   Anticipated DC Date:  02/21/2013   Anticipated DC Plan:  HOME/SELF CARE      DC Planning Services  CM consult      Choice offered to / List presented to:             Status of service:  Completed, signed off Medicare Important Message given?   (If response is "NO", the following Medicare IM given date fields will be blank) Date Medicare IM given:   Date Additional Medicare IM given:    Discharge Disposition:  HOME/SELF CARE  Per UR Regulation:  Reviewed for med. necessity/level of care/duration of stay  If discussed at Long Length of Stay Meetings, dates discussed:    Comments:  02/21/13 Shawntez Dickison RN,BSN NCM [phone] NOTED NSG NOTE ON PATIENT ABOUT LOVENOX INJECTIONS.NO D/C ORDERS OR NEEDS.  02/19/13 Bastian Andreoli RN,BSN NCM [phone] HEP GTT.PATIENT HAS SCRIPT COVERAGE.

## 2013-02-19 NOTE — ED Notes (Signed)
Patient back from CT.

## 2013-02-19 NOTE — Telephone Encounter (Signed)
Lupita Leash called back wants [place] to keep eye on pt for when he is discharged to schedule appointment if needed. I told her I would try but couldn't commit to that. She said she would keep an eye out for the pt also and would let me know.

## 2013-02-19 NOTE — Consult Note (Signed)
ID: Art Buff OB: 16-Jun-1956  MR#: [id number]  JYN#:[id number]  PCP: Tonye Pearson, MD GYN:   SU:  OTHER MD: Stan Head, Dow Adolph   HISTORY OF PRESENT ILLNESS: "Antonio [name]" is a 57 year-old [place] man with a history of Klinefelter's syndrome. He was diagnosed in 25 at [facility] and was started on testosterone supplementation then. More recently his testosterone level was found to be somewhat lower than expected and his dose was increased (April 2014). 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    A few weeks later, on 02/08/2013 he presented to Urgent Care with swelling in his Right leg and Dopplers showed acute deep vein thrombosis involving posterior tibial and peroneal veins, coursing through to the popliteal and extending to mid femoral veins of the right lower extremity. He was started on xarelto/ rivaroxaban, and purchsed 15 tablets initially for $142, then 27 tablets for $250 (he used a company coupon so actually only paid $5).  Repeat dopplers 02/14/2013 showed acute to subacute deep vein thrombosis involving the right femoral, popliteal, proximal posterior tibial, and proximal peroneal veins. This was felt to be at least stable. However this morning (02/19/2013) the patient developed some strange crawling feeling in his Right leg folowed by chest pressure. He presented to the ED and CT-angio was positive for pulmonary embolus with evidence of both acute and chronic thromboembolic disease in the lungs bilaterally. All filling defects in the pulmonary  arteries appeared to be nonocclusive. There was no evidence of pulmonary hemorrhage to suggest a frank pulmonary infarction.  Antonio [name] was admitted for IV heparinization and we were consulted for further management     INTERVAL HISTORY: Antonio [name] was evaluated in his room 02/19/2013; no family was  present.  REVIEW OF SYSTEMS: Currently he denies headaches, visual changes, dizzyness, nausea or vomiting; denies cough, phlegm production, hemoptysis or pleurisy. He tells me he had a fall abut 18 months ago and has occasionally felt "lightheadad" since. He had no bleeding complications from the Xarelto. A detailed review of systems was otherwise unremarkable.  PAST MEDICAL HISTORY: Past Medical History  Diagnosis Date  . GERD (gastroesophageal reflux disease)   . Asthma   . Klinefelter syndrome   . Hyperlipidemia   . Chronic headaches   . Eosinophilic esophagitis   . Seasonal allergies   . Cervical spine degeneration   . Cataract     PAST SURGICAL HISTORY: Past Surgical History  Procedure Laterality Date  . Cholecystectomy      ERCP for cbd stones  . Tonsillectomy    . Cataract surgery      bilateral  . Hand tendon surgery      left, work comp  . Ercp    . Colonoscopy    . Upper gastrointestinal endoscopy      FAMILY HISTORY Family History  Problem Relation Age of Onset  . Colon cancer Neg Hx   . Irritable bowel syndrome Sister   . Allergies Sister   . Allergies Mother   the patient's father drowned when the patient was 46 years old (rip tide at [facility]). The patient's mother is currently 13. The patient has one sister. 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 His wife [name] used to work at [employer] but is now retired, with significant COPD problems. She has 3 sons from a prior marriage ("a Information systems manager, a Corporate investment banker, and an Sport and exercise psychologist). They have 4 biological and 2 step grandchildren. The patient is not a church attender    ADVANCED DIRECTIVES:    HEALTH MAINTENANCE: History  Substance Use Topics  . Smoking status:  Passive Smoke Exposure - Never Smoker    Types: Cigarettes  . Smokeless tobacco: Never Used  . Alcohol Use: No     Colonoscopy:  PSA:  Bone density:  Lipid panel:  Allergies  Allergen Reactions  . Codeine Other (See Comments)    REACTION: difficulty breathing, nausea    Current Facility-Administered Medications  Medication Dose Route Frequency Provider Last Rate Last Dose  . 0.9 %  sodium chloride infusion   Intravenous STAT Rolan Bucco, MD 100 mL/hr at 02/19/13 1024    . 0.9 %  sodium chloride infusion  250 mL Intravenous PRN Maryruth Bun Rama, MD      . acetaminophen (TYLENOL) tablet 650 mg  650 mg Oral Q6H PRN Maryruth Bun Rama, MD       Or  . acetaminophen (TYLENOL) suppository 650 mg  650 mg Rectal Q6H PRN Christina P Rama, MD      . albuterol (PROVENTIL HFA;VENTOLIN HFA) 108 (90 BASE) MCG/ACT inhaler 2 puff  2 puff Inhalation Q4H PRN Christina P Rama, MD      . ALPRAZolam Prudy Feeler) tablet 0.25 mg  0.25 mg Oral TID PRN Maryruth Bun Rama, MD      . alum & mag hydroxide-simeth (MAALOX/MYLANTA) 200-200-20 MG/5ML suspension 30 mL  30 mL Oral Q6H PRN Christina P Rama, MD      . heparin ADULT infusion 100 units/mL (25000 units/250 mL)  1,100 Units/hr Intravenous Continuous Teressa Lower, RPH 11 mL/hr at 02/19/13 1453 1,100 Units/hr at 02/19/13 1453  . loratadine (CLARITIN) tablet 10 mg  10 mg Oral Daily Maryruth Bun Rama, MD   10 mg at 02/19/13 1229  . montelukast (SINGULAIR) tablet 10 mg  10 mg Oral QHS Christina P Rama, MD      . multivitamin with minerals tablet 1 tablet  1 tablet Oral Daily Maryruth Bun Rama, MD   1 tablet at 02/19/13 1229  . ondansetron (ZOFRAN) tablet 4 mg  4 mg Oral Q6H PRN Christina P Rama, MD       Or  . ondansetron (ZOFRAN) injection 4 mg  4 mg Intravenous Q6H PRN Christina P Rama, MD      . pantoprazole (PROTONIX) EC tablet 40 mg  40 mg Oral Daily Maryruth Bun Rama, MD   40 mg at 02/19/13 1229  . polyethylene glycol (MIRALAX / GLYCOLAX) packet 17 g  17 g Oral  Daily PRN Christina P Rama, MD      . sodium chloride 0.9 % injection 3 mL  3 mL Intravenous Q12H Christina P Rama, MD      . sodium chloride 0.9 % injection 3 mL  3 mL Intravenous Q12H Maryruth Bun Rama, MD   3 mL at 02/19/13 1452  . sodium chloride 0.9 % injection 3 mL  3 mL Intravenous PRN Maryruth Bun Rama, MD        OBJECTIVE: middel aged White male examined in bed Filed Vitals:   02/19/13 1504  BP: 142/57  Pulse: 80  Temp: 98.2 F (36.8 C)  Resp: 18  Body mass index is 24.54 kg/(m^2).      Sclerae unicteric Oropharynx clear No cervical or supraclavicular adenopathy Lungs no rales or rhonchi, good excursion bilaterally Heart regular rate and rhythm, no rhythm appreciated Abd benign MSK 1 + Right LE edema, no erythema Neuro: non-focal, well-oriented, appropriate affect   LAB RESULTS:  CMP     Component Value Date/Time   NA 140 02/19/2013 0557   K 3.5 02/19/2013 0557   CL 102 02/19/2013 0557   CO2 28 02/19/2013 0557   GLUCOSE 95 02/19/2013 0557   BUN 17 02/19/2013 0557   CREATININE 1.20 02/19/2013 0557   CREATININE 1.22 02/08/2013 1540   CALCIUM 10.1 02/19/2013 0557   PROT 6.3 02/08/2013 1540   ALBUMIN 3.8 02/08/2013 1540   AST 19 02/08/2013 1540   ALT 17 02/08/2013 1540   ALKPHOS 48 02/08/2013 1540   BILITOT 1.0 02/08/2013 1540   GFRNONAA 66* 02/19/2013 0557   GFRAA 76* 02/19/2013 0557    I No results found for this basename: SPEP, UPEP,  kappa and lambda light chains    Lab Results  Component Value Date   WBC 6.6 02/19/2013   NEUTROABS 4.2 02/19/2013   HGB 16.3 02/19/2013   HCT 44.2 02/19/2013   MCV 83.9 02/19/2013   PLT 246 02/19/2013    @LASTCHEMISTRY @  No results found for this basename: LABCA2    No components found with this basename: LABCA125    No results found for this basename: INR,  in the last 168 hours  Urinalysis    Component Value Date/Time   COLORURINE AMBER BIOCHEMICALS MAY BE AFFECTED BY COLOR* 10/22/2008 1528   APPEARANCEUR CLEAR 10/22/2008 1528   LABSPEC  1.013 10/22/2008 1528   PHURINE 6.5 10/22/2008 1528   GLUCOSEU NEGATIVE 10/22/2008 1528   HGBUR NEGATIVE 10/22/2008 1528   BILIRUBINUR neg 12/25/2012 0953   BILIRUBINUR SMALL* 10/22/2008 1528   KETONESUR NEGATIVE 10/22/2008 1528   PROTEINUR NEGATIVE 10/22/2008 1528   UROBILINOGEN 0.2 12/25/2012 0953   UROBILINOGEN 1.0 10/22/2008 1528   NITRITE neg 12/25/2012 0953   NITRITE NEGATIVE 10/22/2008 1528   LEUKOCYTESUR Negative 12/25/2012 0953    STUDIES: Ct Angio Chest Pe W/cm &/or Wo Cm  02/19/2013   *RADIOLOGY REPORT*  Clinical Data: Chest pain.  History of deep venous thrombosis. Evaluate for pulmonary embolism.  CT ANGIOGRAPHY CHEST  Technique:  Multidetector CT imaging of the chest using the standard protocol during bolus administration of intravenous contrast. Multiplanar reconstructed images including MIPs were obtained and reviewed to evaluate the vascular anatomy.  Contrast: 80mL OMNIPAQUE IOHEXOL 350 MG/ML SOLN  Comparison: No priors.  Findings:  Mediastinum: There are multiple filling defects in the pulmonary arteries bilaterally, compatible with pulmonary emboli.  Several of these are central while others are eccentric, suggesting a combination of acute and chronic thromboembolic disease.  The largest burden of clot extends from the distal right main pulmonary artery into the right lower lobe pulmonary artery, terminating in proximal segmental sized branches.  All of these filling defects appear to be nonocclusive at this time. Heart size is normal. There is no significant pericardial fluid, thickening or pericardial calcification. No pathologically enlarged mediastinal or hilar lymph nodes. Esophagus is unremarkable in appearance.  Lungs/Pleura: Mild dependent atelectasis in the lower lobes of the lungs bilaterally.  No acute consolidative airspace disease.  No pleural effusions.  No suspicious appearing pulmonary nodules or masses.  Upper Abdomen: Status post cholecystectomy.  Musculoskeletal: There are no aggressive  appearing lytic or blastic  lesions noted in the visualized portions of the skeleton.  IMPRESSION: 1.  Study is positive for pulmonary embolus with evidence of both acute and chronic thromboembolic disease in the lungs bilaterally, as above.  At this time, all filling defects in the pulmonary arteries appear to be nonocclusive.  No evidence of pulmonary hemorrhage to suggest a frank pulmonary infarction at this time. 2.  Status post cholecystectomy.  Critical Value/emergent results were called by telephone at the time of interpretation on 02/19/2013 at 06:41 a.m. to Dr. Fredderick Phenix, who verbally acknowledged these results.   Original Report Authenticated By: Trudie Reed, M.D.   US Venous Img Lower Unilateral Right  02/12/2013   **ADDENDUM** CREATED: 02/12/2013 17:03:46  Compared to the ultrasound from the vascular lab on 05/20 12/2012, there is no significant change.  No evidence of proximal propagation of the femoral vein clot.  **END ADDENDUM** SIGNED BY: Genevive Bi, M.D.  02/11/2013   *RADIOLOGY REPORT*  Clinical Data: Positive DVT at Urgent Care.  Right LOWER EXTREMITY VENOUS DUPLEX ULTRASOUND  Technique:  Gray-scale sonography with graded compression, as well as color Doppler and duplex ultrasound were performed to evaluate the deep venous system of the lower extremity from the level of the common femoral vein through the popliteal and proximal calf veins. Spectral Doppler was utilized to evaluate flow at rest and with distal augmentation maneuvers.  Comparison:  None.  Findings:  Normal compressibility of the common femoral and profunda femoral vein.  Beginning in the femoral vein there is noncompressibility in the proximal aspect as well as mid distal aspect.  There is occlusive thrombus within the mid and distal superficial femoral vein.  This extends into the posterior tibial and peroneal veins.  IMPRESSION: Deep venous thrombosis within the right femoral vein extending into the calf veins.  Original  Report Authenticated By: Genevive Bi, M.D.    ASSESSMENT: 57 y.o. 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 In addition he is appropriately concerned about the clotting issue. Accordingly I have to conclude that the rivaroxaban was not effective in his case. I suspect the problem is that we don't have a range of doses for this drug (as we do for warfarin), and with the double-risk of clotting the fixed dose of this drug may not have been sufficient. In any case, I recommend not going back to Xarelto.  He has several other options. He could try other "-xabans"--other factor 10 inhibitors. I would not recommend that for the reasons discussed above. He could go on daily lovenox. The patient has ruled that out after discussing it. Two other choices are warfarin and dabigratan/ Pradaxa. Dabigratan is, like xarelto, taken orally, expensive, and requires no monitoring. It may fail for the same reason (fixed dose) but it works by a different mechanism of action (Facto 2 inhibition) and therefore has a better chance of working that other "xabans".  Warfarin is a perfectly good choice. It is inexpensive. It is as effective as the newer drugs, with a very small increase in bleeding (for instance 1% with Xarelto and  1.7% with warfarin in one trial). It requires monitoring, but the flip side of that is that we can choose the INR range we want, and in this patient I would suggest an INR of 2.5-3.5. At this point Mr Sandoz is leaning  towards warfarin. If so he will want to be monitored in [place] and likely we can find him a coumadin clinic there.  He had questions about removing the clot surgically, receiving "clot busters," and having an IVC filter placed. I do not recommend any of those options. I will follow-up with the patient as he makes his choice and will help you operationalize it. Appreciate consult!   Lowella Dell, MD   02/19/2013 5:54 PM

## 2013-02-19 NOTE — Telephone Encounter (Signed)
Patient is calling to let Antonio [name] know that he is being admitted to [facility] for blood clots in the lungs. Please call patient at [phone]

## 2013-02-20 ENCOUNTER — Other Ambulatory Visit: Payer: Self-pay | Admitting: Oncology

## 2013-02-20 DIAGNOSIS — K219 Gastro-esophageal reflux disease without esophagitis: Secondary | ICD-10-CM

## 2013-02-20 DIAGNOSIS — D6859 Other primary thrombophilia: Secondary | ICD-10-CM | POA: Insufficient documentation

## 2013-02-20 LAB — CBC
MCH: 29.4 pg (ref 26.0–34.0)
MCHC: 34.8 g/dL (ref 30.0–36.0)
Platelets: 200 10*3/uL (ref 150–400)
RBC: 5.27 MIL/uL (ref 4.22–5.81)
RDW: 13.8 % (ref 11.5–15.5)

## 2013-02-20 MED ORDER — PATIENT'S GUIDE TO USING COUMADIN BOOK
Freq: Once | Status: AC
  Administered 2013-02-20: 15:00:00
  Filled 2013-02-20: qty 1

## 2013-02-20 MED ORDER — WARFARIN SODIUM 7.5 MG PO TABS
7.5000 mg | ORAL_TABLET | Freq: Once | ORAL | Status: AC
  Administered 2013-02-20: 7.5 mg via ORAL
  Filled 2013-02-20: qty 1

## 2013-02-20 MED ORDER — WARFARIN VIDEO
Freq: Once | Status: AC
  Administered 2013-02-20: 16:00:00

## 2013-02-20 MED ORDER — WARFARIN - PHARMACIST DOSING INPATIENT: Freq: Every day | Status: DC

## 2013-02-20 NOTE — Progress Notes (Signed)
[facility] PROGRESS NOTE  Antonio [name] ZOX:[id number] DOB: 1956-02-02 DOA: 02/19/2013 PCP: Tonye Pearson, MD  Assessment/Plan: Principal Problem:   Acute and chronic pulmonary embolism in a patient with Factor V Leiden Active Problems:   Extrinsic asthma, unspecified   GERD   Klinefelter's syndrome   DVT (deep venous thrombosis)    1. DVT (deep venous thrombosis)/Pulmonary embolism: Patient with known history of thrombophilia, secondary to factor V Leiden, as well as previous RLE DVT, presenting with acute chest discomfort/dysnea, and found to have CTA-confirmed bilateral acute and chronic PEs. Patient seemingly failed outpatient therapy with Xarelto. Managing with iv Heparin infusion. Dr Chales Abrahams provided hematology consultation. Hematology consult for assistance in long-term anti-coagulation treatment recommendations. Managing as recommended.  2. Extrinsic asthma, unspecified: Stable/asymptomatic at this time.  Managing with prn Bronchodilators.  3. GERD: Asymptomatic on PPI.   4. Klinefelter's syndrome:  Managed with testosterone replacement therapy. Continued.    Code Status: Full Code. Family Communication:  Disposition Plan: To be determined.    Brief narrative: 57 y.o. male with a PMH of GERD, bronchial asthma, dyslipidemia, chronic headaches, seasonal allergies, eosinophilic esophagitis, cervical spine DJD, bilateral cataracts, s/p lens implant and Klinefelter's syndrome on chronic testosterone therapy, factor V Leiden (tested prior to increasing testosterone dosage 01/09/12) and DVT of the RLE diagnosed 02/08/13 when he developed abrupt onset of RLE swelling. He subsequently was treated with Xarelto and presented to the [facility] ER on 02/19/13 with a sensation of the RLE clot moving that awakened him from sleep followed by chest pressure and some dyspnea. He used his ProAir inhaler without relief. Upon initial evaluation in the ER, a CT scan was done which  confirmed pulmonary emboli. Symptoms also were associated with some lightheadedness. Admitted for further management.    Consultants:  Dr Darnelle Catalan, hematologist.   Procedures:  CTA.   Antibiotics:  N/A.   HPI/Subjective: No new issues.  Objective: Vital signs in last 24 hours: Temp:  [98.1 F (36.7 C)-98.9 F (37.2 C)] 98.1 F (36.7 C) (06/05 0619) Pulse Rate:  [64-83] 64 (06/05 0619) Resp:  [16-18] 18 (06/05 0619) BP: (107-142)/(57-86) 107/61 mmHg (06/05 0619) SpO2:  [97 %-100 %] 98 % (06/05 0619) Weight:  [75.4 kg (166 lb 3.6 oz)] 75.4 kg (166 lb 3.6 oz) (06/04 0946) Weight change: 0.557 kg (1 lb 3.6 oz) Last BM Date: 02/19/13  Intake/Output from previous day: 06/04 0701 - 06/05 0700 In: 2153.5 [P.O.:960; I.V.:1193.5] Out: 2025 [Urine:2025]     Physical Exam: General: Comfortable, alert, communicative, fully oriented, not short of breath at rest.  HEENT:  No clinical pallor, no jaundice, no conjunctival injection or discharge. NECK:  Supple, JVP not seen, no carotid bruits, no palpable lymphadenopathy, no palpable goiter. CHEST:  Clinically clear to auscultation, no wheezes, no crackles. HEART:  Sounds 1 and 2 heard, normal, regular, no murmurs. ABDOMEN:  Full, soft, non-tender, no palpable organomegaly, no palpable masses, normal bowel sounds. GENITALIA:  Not examined. LOWER EXTREMITIES:  No pitting edema, palpable peripheral pulses. Healing abrasion medial aspect of left ankle.  MUSCULOSKELETAL SYSTEM:  Unremarkable. CENTRAL NERVOUS SYSTEM:  No focal neurologic deficit on gross examination.  Lab Results:  Recent Labs  02/19/13 0557 02/20/13 0535  WBC 6.6 5.4  HGB 16.3 15.5  HCT 44.2 44.5  PLT 246 200    Recent Labs  02/19/13 0557  NA 140  K 3.5  CL 102  CO2 28  GLUCOSE 95  BUN 17  CREATININE 1.20  CALCIUM 10.1  No results found for this or any previous visit (from the past 240 hour(s)).   Studies/Results: Ct Angio Chest Pe W/cm &/or  Wo Cm  02/19/2013   *RADIOLOGY REPORT*  Clinical Data: Chest pain.  History of deep venous thrombosis. Evaluate for pulmonary embolism.  CT ANGIOGRAPHY CHEST  Technique:  Multidetector CT imaging of the chest using the standard protocol during bolus administration of intravenous contrast. Multiplanar reconstructed images including MIPs were obtained and reviewed to evaluate the vascular anatomy.  Contrast: 80mL OMNIPAQUE IOHEXOL 350 MG/ML SOLN  Comparison: No priors.  Findings:  Mediastinum: There are multiple filling defects in the pulmonary arteries bilaterally, compatible with pulmonary emboli.  Several of these are central while others are eccentric, suggesting a combination of acute and chronic thromboembolic disease.  The largest burden of clot extends from the distal right main pulmonary artery into the right lower lobe pulmonary artery, terminating in proximal segmental sized branches.  All of these filling defects appear to be nonocclusive at this time. Heart size is normal. There is no significant pericardial fluid, thickening or pericardial calcification. No pathologically enlarged mediastinal or hilar lymph nodes. Esophagus is unremarkable in appearance.  Lungs/Pleura: Mild dependent atelectasis in the lower lobes of the lungs bilaterally.  No acute consolidative airspace disease.  No pleural effusions.  No suspicious appearing pulmonary nodules or masses.  Upper Abdomen: Status post cholecystectomy.  Musculoskeletal: There are no aggressive appearing lytic or blastic lesions noted in the visualized portions of the skeleton.  IMPRESSION: 1.  Study is positive for pulmonary embolus with evidence of both acute and chronic thromboembolic disease in the lungs bilaterally, as above.  At this time, all filling defects in the pulmonary arteries appear to be nonocclusive.  No evidence of pulmonary hemorrhage to suggest a frank pulmonary infarction at this time. 2.  Status post cholecystectomy.  Critical  Value/emergent results were called by telephone at the time of interpretation on 02/19/2013 at 06:41 a.m. to Dr. Fredderick Phenix, who verbally acknowledged these results.   Original Report Authenticated By: Trudie Reed, M.D.    Medications: Scheduled Meds: . loratadine  10 mg Oral Daily  . montelukast  10 mg Oral QHS  . multivitamin with minerals  1 tablet Oral Daily  . pantoprazole  40 mg Oral Daily  . sodium chloride  3 mL Intravenous Q12H  . sodium chloride  3 mL Intravenous Q12H   Continuous Infusions: . heparin 1,100 Units/hr (02/20/13 0012)   PRN Meds:.sodium chloride, acetaminophen, acetaminophen, albuterol, ALPRAZolam, alum & mag hydroxide-simeth, ondansetron (ZOFRAN) IV, ondansetron, polyethylene glycol, sodium chloride    LOS: 1 day   Nicle Connole,CHRISTOPHER  [facility] Pager [phone]. If 8PM-8AM, please contact night-coverage at [email or web address], password [facility] 02/20/2013, 7:02 AM  LOS: 1 day

## 2013-02-20 NOTE — Discharge Summary (Signed)
Physician Discharge Summary  Antonio [name] JXB:[id number] DOB: [birth year] DOA: 02/19/2013  PCP: Tonye Pearson, MD  Admit date: 02/19/2013 Discharge date: 02/21/2013  Time spent: 40 minutes  Recommendations for Outpatient Follow-up:  Follow up with PMD. Follow up with Dr Ruthann Cancer, hematologist. For PT/INR check on Monday 02/24/13, at [facility].   Discharge Diagnoses:  Principal Problem:   Acute and chronic pulmonary embolism in a patient with Factor V Leiden Active Problems:   Extrinsic asthma, unspecified   GERD   Klinefelter's syndrome   DVT (deep venous thrombosis)   Discharge Condition: Satisfactory.   Diet recommendation: Regular.   Filed Weights   02/19/13 0519 02/19/13 0946 02/21/13 0900  Weight: 74.844 kg (165 lb) 75.4 kg (166 lb 3.6 oz) 74.8 kg (164 lb 14.5 oz)    History of present illness:  57 y.o. male with a PMH of GERD, bronchial asthma, dyslipidemia, chronic headaches, seasonal allergies, eosinophilic esophagitis, cervical spine DJD, bilateral cataracts, s/p lens implant and Klinefelter's syndrome on chronic testosterone therapy, factor V Leiden (tested prior to increasing testosterone dosage 01/09/12) and DVT of the RLE diagnosed 02/08/13 when he developed abrupt onset of RLE swelling. He subsequently was treated with Xarelto and presented to the [facility] ER on 02/19/13 with a sensation of the RLE clot moving that awakened him from sleep followed by chest pressure and some dyspnea. He used his ProAir inhaler without relief. Upon initial evaluation in the ER, a CT scan was done which confirmed pulmonary emboli. Symptoms also were associated with some lightheadedness. Admitted for further management.    Hospital Course:  1. DVT (deep venous thrombosis)/Pulmonary embolism:  Patient with known history of thrombophilia, secondary to factor V Leiden, as well as previous RLE DVT, presented with acute chest discomfort/dyspnea, and was found to have CTA-confirmed  bilateral acute and chronic PEs. Patient seemingly failed outpatient therapy with Xarelto. He was initially placed on iv Heparin infusion, remained hemodynamically stable, and as of AM 02/20/13, had no chest discomfort or dyspnea. Dr Chales Abrahams provided hematology consultation for assistance in long-term anti-coagulation treatment recommendations. After detailed discussion with patient and review of options and alternatives, management with Warfarin was recommended. Warfarin was started on 02/20/13 and patient transitioned to Lovenox therapy effective 02/21/13. Patient and family have been instructed in administration of Lovenox, and he will follow up with Dr Darnelle Catalan on discharge, for monitoring of PT/INR, adjustment of Warfarin dosage as indicated, as well as as timing of Lovenox discontinuation.  2. Extrinsic asthma, unspecified: Stable/asymptomatic from this view point, during hospitalization. Managed with prn Bronchodilators.  3. GERD: Asymptomatic on PPI.  4. Klinefelter's syndrome: Managed with testosterone replacement therapy.   Procedures:  See below  Consultations:  Dr Darnelle Catalan, hematologist.   Discharge Exam: Filed Vitals:   02/20/13 1405 02/20/13 2159 02/21/13 0530 02/21/13 0900  BP: 115/61 112/61 99/60   Pulse: 72 74 58   Temp: 98.3 F (36.8 C) 99.2 F (37.3 C) 97.8 F (36.6 C)   TempSrc: Oral Oral Oral   Resp: 18 16 16    Height:    5\' 9"  (1.753 m)  Weight:    74.8 kg (164 lb 14.5 oz)  SpO2: 98% 95% 97%     General: Comfortable, alert, communicative, fully oriented, not short of breath at rest.  HEENT: No clinical pallor, no jaundice, no conjunctival injection or discharge.  NECK: Supple, JVP not seen, no carotid bruits, no palpable lymphadenopathy, no palpable goiter.  CHEST: Clinically clear to auscultation, no wheezes,  no crackles.  HEART: Sounds 1 and 2 heard, normal, regular, no murmurs.  ABDOMEN: Full, soft, non-tender, no palpable organomegaly, no palpable  masses, normal bowel sounds.  GENITALIA: Not examined.  LOWER EXTREMITIES: No pitting edema, palpable peripheral pulses. Healing abrasion medial aspect of left ankle.  MUSCULOSKELETAL SYSTEM: Unremarkable.  CENTRAL NERVOUS SYSTEM: No focal neurologic deficit on gross examination.  Discharge Instructions      Discharge Orders   Future Appointments Provider Department Dept Phone   02/22/2013 9:15 AM [facility] Inj Nurse [facility] [phone]   02/24/2013 8:45 AM [facility] [phone]   02/24/2013 9:00 AM [facility] Anti Coag [facility] [phone]   02/24/2013 9:15 AM [facility] Inj Nurse [facility] [phone]   03/06/2013 8:00 AM [facility] MEDICAL ONCOL[facility] [phone]   03/06/2013 8:30 AM Lowella Dell, MD [facility] MEDICAL ONCOLOGY [phone]   Future Orders Complete By Expires     Diet general  As directed     Increase activity slowly  As directed         Medication List    STOP taking these medications       multivitamin with minerals Tabs     Rivaroxaban 15 MG Tabs tablet  Commonly known as:  XARELTO      TAKE these medications       enoxaparin 120 MG/0.8ML injection  Commonly known as:  LOVENOX  Inject 0.77 mLs (115 mg total) into the skin daily.     loratadine 10 MG tablet  Commonly known as:  CLARITIN  Take 10 mg by mouth daily.     montelukast 10 MG tablet  Commonly known as:  SINGULAIR  Take 1 tablet (10 mg total) by mouth at bedtime.     pantoprazole 40 MG tablet  Commonly known as:  PROTONIX  Take 40 mg by mouth daily.     warfarin 7.5 MG tablet  Commonly known as:  COUMADIN  Take 1 tablet (7.5 mg total) by mouth one time only at 6 PM.       Allergies  Allergen Reactions  . Codeine Other (See Comments)    REACTION: difficulty breathing, nausea    Follow-up Information   Follow up with DOOLITTLE, Harrel Lemon, MD.   Contact information:   [address] [place] [address] [phone]       Schedule an appointment as soon as possible for a visit with Lowella Dell, MD.   Contact information:   [address] [place] [address] [phone]        The results of significant diagnostics from this hospitalization (including imaging, microbiology, ancillary and laboratory) are listed below for reference.    Significant Diagnostic Studies: Ct Angio Chest Pe W/cm &/or Wo Cm  02/19/2013   *RADIOLOGY REPORT*  Clinical Data: Chest pain.  History of deep venous thrombosis. Evaluate for pulmonary embolism.  CT ANGIOGRAPHY CHEST  Technique:  Multidetector CT imaging of the chest using the standard protocol during bolus administration of intravenous contrast. Multiplanar reconstructed images including MIPs were obtained and reviewed to evaluate the vascular anatomy.  Contrast: 80mL OMNIPAQUE IOHEXOL 350 MG/ML SOLN  Comparison: No priors.  Findings:  Mediastinum: There are multiple filling defects in the pulmonary arteries bilaterally, compatible with pulmonary emboli.  Several of these are central while others are eccentric, suggesting a combination of acute and chronic  thromboembolic disease.  The largest burden of clot extends from the distal right main pulmonary artery into the right lower lobe pulmonary artery, terminating in proximal segmental sized branches.  All of these filling defects appear to be nonocclusive at this time. Heart size is normal. There is no significant pericardial fluid, thickening or pericardial calcification. No pathologically enlarged mediastinal or hilar lymph nodes. Esophagus is unremarkable in appearance.  Lungs/Pleura: Mild dependent atelectasis in the lower lobes of the lungs bilaterally.  No acute consolidative airspace disease.  No pleural effusions.  No suspicious appearing pulmonary nodules or  masses.  Upper Abdomen: Status post cholecystectomy.  Musculoskeletal: There are no aggressive appearing lytic or blastic lesions noted in the visualized portions of the skeleton.  IMPRESSION: 1.  Study is positive for pulmonary embolus with evidence of both acute and chronic thromboembolic disease in the lungs bilaterally, as above.  At this time, all filling defects in the pulmonary arteries appear to be nonocclusive.  No evidence of pulmonary hemorrhage to suggest a frank pulmonary infarction at this time. 2.  Status post cholecystectomy.  Critical Value/emergent results were called by telephone at the time of interpretation on 02/19/2013 at 06:41 a.m. to Dr. Fredderick Phenix, who verbally acknowledged these results.   Original Report Authenticated By: Trudie Reed, M.D.   US Venous Img Lower Unilateral Right  02/12/2013   **ADDENDUM** CREATED: 02/12/2013 17:03:46  Compared to the ultrasound from the vascular lab on 05/20 12/2012, there is no significant change.  No evidence of proximal propagation of the femoral vein clot.  **END ADDENDUM** SIGNED BY: Genevive Bi, M.D.  02/11/2013   *RADIOLOGY REPORT*  Clinical Data: Positive DVT at Urgent Care.  Right LOWER EXTREMITY VENOUS DUPLEX ULTRASOUND  Technique:  Gray-scale sonography with graded compression, as well as color Doppler and duplex ultrasound were performed to evaluate the deep venous system of the lower extremity from the level of the common femoral vein through the popliteal and proximal calf veins. Spectral Doppler was utilized to evaluate flow at rest and with distal augmentation maneuvers.  Comparison:  None.  Findings:  Normal compressibility of the common femoral and profunda femoral vein.  Beginning in the femoral vein there is noncompressibility in the proximal aspect as well as mid distal aspect.  There is occlusive thrombus within the mid and distal superficial femoral vein.  This extends into the posterior tibial and peroneal veins.  IMPRESSION:  Deep venous thrombosis within the right femoral vein extending into the calf veins.  Original Report Authenticated By: Genevive Bi, M.D.    Microbiology: No results found for this or any previous visit (from the past 240 hour(s)).   Labs: Basic Metabolic Panel:  Recent Labs Lab 02/19/13 0557 02/21/13 0444  NA 140 139  K 3.5 4.3  CL 102 103  CO2 28 28  GLUCOSE 95 105*  BUN 17 13  CREATININE 1.20 1.12  CALCIUM 10.1 9.3   Liver Function Tests: No results found for this basename: AST, ALT, ALKPHOS, BILITOT, PROT, ALBUMIN,  in the last 168 hours No results found for this basename: LIPASE, AMYLASE,  in the last 168 hours No results found for this basename: AMMONIA,  in the last 168 hours CBC:  Recent Labs Lab 02/19/13 0557 02/20/13 0535 02/21/13 0444  WBC 6.6 5.4 6.0  NEUTROABS 4.2  --   --   HGB 16.3 15.5 15.7  HCT 44.2 44.5 43.8  MCV 83.9 84.4 84.7  PLT 246 200 230   Cardiac Enzymes:  Recent  Labs Lab 02/19/13 0557  TROPONINI <0.30   BNP: BNP (last 3 results) No results found for this basename: PROBNP,  in the last 8760 hours CBG: No results found for this basename: GLUCAP,  in the last 168 hours     Signed:  Lyniah Fujita,CHRISTOPHER  [facility] 02/21/2013, 2:26 PM

## 2013-02-20 NOTE — Progress Notes (Signed)
Pt and stepson educated on how to perform Lovenox injections, per Dr. Darrall Dears instructions so that pt may get Lovenox injection at home over the weekend. Stepson demonstrated understanding on how to administer injection. Verne Cove, Lavone Orn, RN

## 2013-02-20 NOTE — Progress Notes (Addendum)
wANTICOAGULATION CONSULT NOTE - F/U Consult  Pharmacy Consult for IV Heparin/Coumadin/Change heparin to lovenox 6/6am Indication: DVT/PE  Allergies  Allergen Reactions  . Codeine Other (See Comments)    REACTION: difficulty breathing, nausea    Patient Measurements: Height: 5\' 9"  (175.3 cm) Weight: 166 lb 3.6 oz (75.4 kg) IBW/kg (Calculated) : 70.7 Heparin Dosing Weight: 75.4 kg  Vital Signs: Temp: 98.1 F (36.7 C) (06/05 0619) Temp src: Oral (06/05 0619) BP: 107/61 mmHg (06/05 0619) Pulse Rate: 64 (06/05 0619)  Labs:  Recent Labs  02/19/13 0557 02/19/13 1405 02/19/13 2225 02/20/13 0535  HGB 16.3  --   --  15.5  HCT 44.2  --   --  44.5  PLT 246  --   --  200  APTT 35  --   --   --   HEPARINUNFRC  --  1.23* 0.49 0.63  CREATININE 1.20  --   --   --   TROPONINI <0.30  --   --   --     Estimated Creatinine Clearance: 68.7 ml/min (by C-G formula based on Cr of 1.2).   Medical History: Past Medical History  Diagnosis Date  . GERD (gastroesophageal reflux disease)   . Asthma   . Klinefelter syndrome   . Hyperlipidemia   . Chronic headaches   . Eosinophilic esophagitis   . Seasonal allergies   . Cervical spine degeneration   . Cataract       Assessment: 71 yoM recently diagnosed on May 24th with a DVT in right femoral vein felt to be related to testosterone injections (on chronic therapy for Klinefelter's) and Factor V Leiden, discharged on Xarelto and presented to [facility] with chest tightness.  He was compliant with therapy. CT showed multiple PE (acute and chronic PEs per radiologist) and patient was initiated on IV heparin and transferred to [facility]  Pharmacy to continue managing IV heparin here at [facility].  Last dose of xarelto was 6/3, Xarelto can effect anti-Xa level (initial level elevation likely d/t its effects), half-life likely 5-9hrs so drug elimination est to be ~40hr after last dose  Heparin level this am = 0.63 on heparin 1100 units/hr  SCr 1.20,  CrCl~68 ml/min  CBC: Hgb and platelets WNL  NO bleeding noted  Hematology has seen patient re: long-term anticoagulation options, warfarin likely to be agent of choice in this patient  Goal of Therapy:  Heparin level 0.3-0.7 units/ml Monitor platelets by anticoagulation protocol: Yes   Plan:   Continue heparin infusion at current rate of 1100 units/hr (11 ml/hr) as level has been therapeutic x 2 and suspect Xarelto should be eliminated by now  Orders to start warfarin tonight - 7.5mg  PO x 1  Daily INR  Will enter orders 6/6 am - consult to convert UFH  to lovenox 1.5mg /kg daily 6/6, at 9am d/c heparin gtt and give lovenox 115mg  at 10am  Coumadin book/video  Juliette Alcide, PharmD, BCPS.   Pager: [phone] 02/20/2013,1:46 PM

## 2013-02-21 ENCOUNTER — Other Ambulatory Visit: Payer: Self-pay | Admitting: Physician Assistant

## 2013-02-21 ENCOUNTER — Telehealth: Payer: Self-pay | Admitting: Hematology & Oncology

## 2013-02-21 ENCOUNTER — Other Ambulatory Visit: Payer: Self-pay | Admitting: Oncology

## 2013-02-21 ENCOUNTER — Telehealth: Payer: Self-pay | Admitting: *Deleted

## 2013-02-21 DIAGNOSIS — K200 Eosinophilic esophagitis: Secondary | ICD-10-CM

## 2013-02-21 DIAGNOSIS — I82409 Acute embolism and thrombosis of unspecified deep veins of unspecified lower extremity: Secondary | ICD-10-CM

## 2013-02-21 DIAGNOSIS — D689 Coagulation defect, unspecified: Secondary | ICD-10-CM

## 2013-02-21 DIAGNOSIS — I2699 Other pulmonary embolism without acute cor pulmonale: Principal | ICD-10-CM

## 2013-02-21 DIAGNOSIS — J45909 Unspecified asthma, uncomplicated: Secondary | ICD-10-CM

## 2013-02-21 DIAGNOSIS — Q984 Klinefelter syndrome, unspecified: Secondary | ICD-10-CM

## 2013-02-21 LAB — CBC
MCH: 30.4 pg (ref 26.0–34.0)
MCV: 84.7 fL (ref 78.0–100.0)
Platelets: 230 10*3/uL (ref 150–400)
RDW: 13.6 % (ref 11.5–15.5)

## 2013-02-21 LAB — BASIC METABOLIC PANEL
Calcium: 9.3 mg/dL (ref 8.4–10.5)
Creatinine, Ser: 1.12 mg/dL (ref 0.50–1.35)
GFR calc Af Amer: 83 mL/min — ABNORMAL LOW (ref >90)

## 2013-02-21 LAB — PROTIME-INR: Prothrombin Time: 12.8 seconds (ref 11.6–15.2)

## 2013-02-21 MED ORDER — WARFARIN SODIUM 7.5 MG PO TABS: 7.5000 mg | ORAL_TABLET | Freq: Once | ORAL | Status: DC

## 2013-02-21 MED ORDER — ENOXAPARIN SODIUM 120 MG/0.8ML ~~LOC~~ SOLN
120.0000 mg | Freq: Once | SUBCUTANEOUS | Status: DC
  Filled 2013-02-21: qty 0.8

## 2013-02-21 MED ORDER — ENOXAPARIN SODIUM 120 MG/0.8ML ~~LOC~~ SOLN
115.0000 mg | SUBCUTANEOUS | Status: DC
  Administered 2013-02-21: 115 mg via SUBCUTANEOUS
  Filled 2013-02-21: qty 0.8

## 2013-02-21 MED ORDER — ENOXAPARIN SODIUM 120 MG/0.8ML ~~LOC~~ SOLN: 115.0000 mg | SUBCUTANEOUS | Status: DC

## 2013-02-21 MED ORDER — WARFARIN SODIUM 7.5 MG PO TABS
7.5000 mg | ORAL_TABLET | Freq: Once | ORAL | Status: DC
  Filled 2013-02-21: qty 1

## 2013-02-21 NOTE — Progress Notes (Signed)
Appreciate pharmacy's help!  Patient will receive lovenox at our clinic [facility]) tomorrow and Monday; he will receive it at Urgent Care Sunday. He will be seen in our Coumadin Clinic at the [facility] Monday and we will check his INR and proceed from there. Patient will follow up with me long term re his hypercoagulable state  Please let me know if we can be of further help!

## 2013-02-21 NOTE — Telephone Encounter (Signed)
sw pt gv appts for inj on 6/714 and 02/24/13. Also gv appt for lab and coumadin clinic,and to have a lab and ov on 03/06/13@ 8am. Pt is aware of the appt d/t. i also told him to ask for an AVS printout when he comes in on 02/22/13...td

## 2013-02-21 NOTE — Telephone Encounter (Signed)
Per note and schedule in Epic pt is being followed up by Dr. Darnelle Catalan

## 2013-02-21 NOTE — Progress Notes (Signed)
Patient discharge to home with Lovenox injection. Tired to educate patient regarding self administration of Lovenox but patient is very hesitant and stated" I will die first before I will give myself an injection.Patient said that he will go to his doctor  On Saturday and Monday for the injection and will go to Urgent care on Sunday, the rest his son will do it.  Discharged a instructions and follow up appointments done and was given to the patient. PIV removed no s/s of infiltration and swelling. Discharge ambulatory, no compalints of any pain or discomfort.

## 2013-02-21 NOTE — Progress Notes (Addendum)
wANTICOAGULATION CONSULT NOTE - F/U Consult  Pharmacy Consult for IV Heparin--> lovenox/Coumadin Indication: DVT/PE  Allergies  Allergen Reactions  . Codeine Other (See Comments)    REACTION: difficulty breathing, nausea    Patient Measurements: Height: 5\' 9"  (175.3 cm) Weight: 166 lb 3.6 oz (75.4 kg) IBW/kg (Calculated) : 70.7 Heparin Dosing Weight: 75.4 kg  Vital Signs: Temp: 97.8 F (36.6 C) (06/06 0530) Temp src: Oral (06/06 0530) BP: 99/60 mmHg (06/06 0530) Pulse Rate: 58 (06/06 0530)  Labs:  Recent Labs  02/19/13 0557  02/19/13 2225 02/20/13 0535 02/21/13 0444  HGB 16.3  --   --  15.5 15.7  HCT 44.2  --   --  44.5 43.8  PLT 246  --   --  200 230  APTT 35  --   --   --   --   LABPROT  --   --   --   --  12.8  INR  --   --   --   --  0.97  HEPARINUNFRC  --   < > 0.49 0.63 0.56  CREATININE 1.20  --   --   --  1.12  TROPONINI <0.30  --   --   --   --   < > = values in this interval not displayed.  Estimated Creatinine Clearance: 73.6 ml/min (by C-G formula based on Cr of 1.12).   Medical History: Past Medical History  Diagnosis Date  . GERD (gastroesophageal reflux disease)   . Asthma   . Klinefelter syndrome   . Hyperlipidemia   . Chronic headaches   . Eosinophilic esophagitis   . Seasonal allergies   . Cervical spine degeneration   . Cataract       Assessment: 63 yoM recently diagnosed on May 24th with a DVT in right femoral vein felt to be related to testosterone injections (on chronic therapy for Klinefelter's) and Factor V Leiden, discharged on Xarelto and presented to [facility] with chest tightness.  He was compliant with therapy. CT showed multiple PE (acute and chronic PEs per radiologist) and patient was initiated on IV heparin and transferred to [facility].  Pharmacy to continue managing IV heparin here at [facility].  Last dose of xarelto was 6/3, Xarelto can effect anti-Xa level (initial level elevation likely d/t its effects), half-life likely 5-9hrs  so drug elimination est to be ~25-40hr after last dose  Heparin level this am = 0.56 on heparin 1100 units/hr  INR this am = 0.97 following 1st dose of warfarin (7.5mg ) last pm, would not expect INR to increase much following 1st dose  CBC: Hgb and platelets WNL  NO bleeding noted  Goal of Therapy:  INR goal 2.5-3.5 Heparin level 0.3-0.7 units/ml Monitor platelets by anticoagulation protocol: Yes   Plan:  Day #2 of overlap therapy for acute VTE  Convert UFH to lovenox 1.5mg /kg daily 6/6, at 9am d/c heparin gtt and give lovenox 115mg  at 10am  Repeat warfarin 7.5mg  PO x 1 tonight   Daily INR  CBC q72h while in hospital  Complete warfarin education  IF discharged today, would send home with 5mg  tabs taking 1.5 tabs every evening until INR f/u.  Will need lovenox 120mg  syringes (dose 115mg ), would dispense #7.  Juliette Alcide, PharmD, BCPS.   Pager: [phone] 02/21/2013,7:55 AM

## 2013-02-22 ENCOUNTER — Ambulatory Visit: Payer: BC Managed Care – PPO

## 2013-02-22 VITALS — BP 117/55 | HR 90 | Temp 98.0°F

## 2013-02-22 DIAGNOSIS — I2699 Other pulmonary embolism without acute cor pulmonale: Secondary | ICD-10-CM

## 2013-02-22 DIAGNOSIS — I82401 Acute embolism and thrombosis of unspecified deep veins of right lower extremity: Secondary | ICD-10-CM

## 2013-02-23 ENCOUNTER — Ambulatory Visit (INDEPENDENT_AMBULATORY_CARE_PROVIDER_SITE_OTHER): Payer: BC Managed Care – PPO | Admitting: Family Medicine

## 2013-02-23 VITALS — BP 124/73 | HR 86 | Temp 98.2°F | Resp 17 | Ht 69.0 in | Wt 168.0 lb

## 2013-02-23 DIAGNOSIS — I82401 Acute embolism and thrombosis of unspecified deep veins of right lower extremity: Secondary | ICD-10-CM

## 2013-02-23 DIAGNOSIS — I2699 Other pulmonary embolism without acute cor pulmonale: Secondary | ICD-10-CM

## 2013-02-23 DIAGNOSIS — I82409 Acute embolism and thrombosis of unspecified deep veins of unspecified lower extremity: Secondary | ICD-10-CM

## 2013-02-23 NOTE — Patient Instructions (Addendum)
[employer]  [phone]

## 2013-02-23 NOTE — Progress Notes (Signed)
57 yo with Factor V Leiden deficiency and testosterone replacement who developed DVT in right leg and subsequent PE's confirmed by CT of lung  Needs Lovenox rx  On coumadin with [facility] sched tomorrow  Objective:  NAD Chest:  Clear Extremities:  1+ edema I gave patient sub Q injection of his 120 mg Lovenox into the LLQ of his abdomen  Assessment:  DVT  Plan:  DVT, PE Continue Lovenox PT tomorrow.  Signed, Elvina Sidle, MD

## 2013-02-24 ENCOUNTER — Other Ambulatory Visit: Payer: Self-pay | Admitting: Physician Assistant

## 2013-02-24 ENCOUNTER — Other Ambulatory Visit: Payer: Self-pay | Admitting: Oncology

## 2013-02-24 ENCOUNTER — Ambulatory Visit (HOSPITAL_BASED_OUTPATIENT_CLINIC_OR_DEPARTMENT_OTHER): Payer: BC Managed Care – PPO | Admitting: Pharmacist

## 2013-02-24 ENCOUNTER — Other Ambulatory Visit (HOSPITAL_BASED_OUTPATIENT_CLINIC_OR_DEPARTMENT_OTHER): Payer: BC Managed Care – PPO | Admitting: Lab

## 2013-02-24 ENCOUNTER — Ambulatory Visit: Payer: BC Managed Care – PPO

## 2013-02-24 DIAGNOSIS — D689 Coagulation defect, unspecified: Secondary | ICD-10-CM

## 2013-02-24 DIAGNOSIS — D6859 Other primary thrombophilia: Secondary | ICD-10-CM

## 2013-02-24 DIAGNOSIS — I2699 Other pulmonary embolism without acute cor pulmonale: Secondary | ICD-10-CM

## 2013-02-24 LAB — PROTIME-INR: Protime: 33.6 Seconds — ABNORMAL HIGH (ref 10.6–13.4)

## 2013-02-24 LAB — CBC WITH DIFFERENTIAL/PLATELET
Basophils Absolute: 0 10*3/uL (ref 0.0–0.1)
EOS%: 4.3 % (ref 0.0–7.0)
MCH: 29.9 pg (ref 27.2–33.4)
MCV: 84.6 fL (ref 79.3–98.0)
MONO%: 9.1 % (ref 0.0–14.0)
RBC: 5.46 10*6/uL (ref 4.20–5.82)
RDW: 13.7 % (ref 11.0–14.6)
nRBC: 0 % (ref 0–0)

## 2013-02-24 MED ORDER — WARFARIN SODIUM 5 MG PO TABS: ORAL_TABLET | ORAL | Status: DC

## 2013-02-24 NOTE — Progress Notes (Signed)
Pt came in for injection. After taking vitals pt informs me he has his on lovenox. Pt was self taught but he is very upset and can not give himself a shot.Pt verbalized over and over I can not do it and was very anxious. His son is going to do for him but was not available today and Dr. Darnelle Catalan set him up to come in. Lovenox 115 mg injected into right abdomen.

## 2013-02-24 NOTE — Progress Notes (Signed)
Patient here for Lovenox injection.  Patient brought his own supply.  Lovenox 120 mg given in right lower abdomen per Dr Magrinat's orders.  Patient tolerated without problem.

## 2013-02-24 NOTE — Patient Instructions (Addendum)
Enoxaparin injection  What is this medicine?  ENOXAPARIN (ee nox a PA rin) is used after knee, hip, or abdominal surgeries to prevent blood clotting. It is also used to treat existing blood clots in the lungs or in the veins.  This medicine may be used for other purposes; ask your health care provider or pharmacist if you have questions.  What should I tell my health care provider before I take this medicine?  They need to know if you have any of these conditions:  -bleeding disorders, hemorrhage, or hemophilia  -infection of the heart or heart valves  -kidney or liver disease  -previous stroke  -prosthetic heart valve  -recent surgery or delivery of a baby  -ulcer in the stomach or intestine, diverticulitis, or other bowel disease  -an unusual or allergic reaction to enoxaparin, heparin, pork or pork products, other medicines, foods, dyes, or preservatives  -pregnant or trying to get pregnant  -breast-feeding  How should I use this medicine?  This medicine is for injection under the skin. It is usually given by a health-care professional. You or a family member may be trained on how to give the injections. If you are to give yourself injections, make sure you understand how to use the syringe, measure the dose if necessary, and give the injection. To avoid bruising, do not rub the site where this medicine has been injected. Do not take your medicine more often than directed. Do not stop taking except on the advice of your doctor or health care professional.  Make sure you receive a puncture-resistant container to dispose of the needles and syringes once you have finished with them. Do not reuse these items. Return the container to your doctor or health care professional for proper disposal.  Talk to your pediatrician regarding the use of this medicine in children. Special care may be needed.  Overdosage: If you think you have taken too much of this medicine contact a poison control center or emergency room at  once.  NOTE: This medicine is only for you. Do not share this medicine with others.  What if I miss a dose?  If you miss a dose, take it as soon as you can. If it is almost time for your next dose, take only that dose. Do not take double or extra doses.  What may interact with this medicine?  Do not take this medicine with any of the following medications:  -aspirin and aspirin-like medicines  -heparin  -mifepristone  -warfarin  This medicine may also interact with the following medications:  -cilostazol  -clopidogrel  -dipyridamole  -NSAIDs, medicines for pain and inflammation, like ibuprofen or naproxen  -sulfinpyrazone  -ticlopidine  This list may not describe all possible interactions. Give your health care provider a list of all the medicines, herbs, non-prescription drugs, or dietary supplements you use. Also tell them if you smoke, drink alcohol, or use illegal drugs. Some items may interact with your medicine.  What should I watch for while using this medicine?  Visit your doctor or health care professional for regular checks on your progress. Your condition will be monitored carefully while you are receiving this medicine. Contact your doctor or health care professional and seek emergency treatment if you develop increased difficulty in breathing, chest pain, dizziness, shortness of breath, swelling in the legs or arms, abdominal pain, decreased vision, pain when walking, or pain and warmth of the arms or legs. These can be signs that your condition has gotten worse.    Monitor your skin closely for easy bruising or red spots, which can be signs of bleeding. If you notice easy bruising or minor bleeding from the nose, gums/teeth, in your urine, or stool, contact your doctor or health care professional right away. The dose of your medicine may need to be changed.  If you are going to have surgery, tell your doctor or health care professional that you are taking this medicine.  Try to avoid injury while you are  using this medicine. Be careful when brushing or flossing your teeth, shaving, cutting your fingernails or toenails, or when using sharp objects. Report any injuries to your doctor or health care professional.  What side effects may I notice from receiving this medicine?  Side effects that you should report to your doctor or health care professional as soon as possible:  -allergic reactions like skin rash, itching or hives, swelling of the face, lips, or tongue  -black, tarry stools  -breathing problems  -dark urine  -feeling faint or lightheaded, falls  -fever  -heavy menstrual bleeding  -unusual bruising or bleeding  Side effects that usually do not require medical attention (report to your doctor or health care professional if they continue or are bothersome):  -pain or irritation at the injection site  This list may not describe all possible side effects. Call your doctor for medical advice about side effects. You may report side effects to FDA at 1-800-FDA-1088.  Where should I keep my medicine?  Keep out of the reach of children.  Store at room temperature between 15 and 30 degrees C (59 and 86 degrees F). Do not freeze. If your injections have been specially prepared, you may need to store them in the refrigerator. Ask your pharmacist. Throw away any unused medicine after the expiration date.  NOTE: This sheet is a summary. It may not cover all possible information. If you have questions about this medicine, talk to your doctor, pharmacist, or health care provider.   2013, Elsevier/Gold Standard. (11/15/2007 4:47:37 PM)

## 2013-02-24 NOTE — Progress Notes (Signed)
INR = 2.8 Pt has taken only 4 doses of warfarin 7.5 mg daily.  In 3 days time his INR jumped from 0.97 up to 2.8 He is on Lovenox until INR at goal of 2.5-3.5 for dx: acute pulmonary embolism in a patient with Factor V Leiden  Bruise on abdomen from Lovenox injections.  He also has bruise on L calf.  A box fell on his leg at work. Pt c/o toe cramping x 2 days.  He thinks this has come from being off [employer]  He is requesting to restart that. Pt plans to eat vit K containing foods in moderation.  He does like to eat spinach & collards.  He eats salads on a regular basis.  He also plans to start drinking 1 glass of wine each day. He takes APAP PRN OTC for pain.  He will avoid NSAID's & ASA. For now he is off Testosterone inj as this may have been cause of his most recent clots (PE). Since we now are closely monitoring his INR, I will have pt restart his Centrum Silver.  Maybe this will help the cramps in his toes to resolve. INR at goal quickly.  I s/w Dr. Darnelle Catalan over the phone & he would like pt to complete Lovenox injections today after 5 days of Heparin tx.  He will go to inj RN here after clinic appt today for his last inj. I feel it is necessary to drop back on his warfarin dose since his INR has become therapeutic after just 4 doses of warfarin 7.5 mg.  He will decrease to 5 mg daily starting today. I will Rx Warfarin 5 mg tabs to [facility] at pt request since right now he has only a few more 7.5 mg tabs at home. Clearly Mr Lech is well educated & has done his homework on understanding anticoagulation.  I did provide him a handout on vit K content in his diet. Repeat INR this Thursday to ensure it remains in goal.  Note pt will be seeing Dr. Darnelle Catalan on 03/06/13 (already scheduled) Ebony Hail, Pharm.D., CPP 02/24/2013@9 :47 AM

## 2013-02-25 ENCOUNTER — Telehealth: Payer: Self-pay

## 2013-02-25 ENCOUNTER — Other Ambulatory Visit: Payer: Self-pay | Admitting: Emergency Medicine

## 2013-02-25 MED ORDER — MONTELUKAST SODIUM 10 MG PO TABS: ORAL_TABLET | ORAL | Status: DC

## 2013-02-25 NOTE — Telephone Encounter (Signed)
Patient needs Cingular refill. States that he is at [facility] and they do not have it. Patient says he called about this yesterday. [phone]

## 2013-02-25 NOTE — Telephone Encounter (Signed)
NEEDS REFILL ON SINGULAR 1O MG

## 2013-02-25 NOTE — Telephone Encounter (Signed)
Notified pt that Rx was sent in earlier today.

## 2013-02-27 ENCOUNTER — Other Ambulatory Visit (HOSPITAL_BASED_OUTPATIENT_CLINIC_OR_DEPARTMENT_OTHER): Payer: BC Managed Care – PPO | Admitting: Lab

## 2013-02-27 ENCOUNTER — Ambulatory Visit (HOSPITAL_BASED_OUTPATIENT_CLINIC_OR_DEPARTMENT_OTHER): Payer: BC Managed Care – PPO | Admitting: Pharmacist

## 2013-02-27 DIAGNOSIS — I82409 Acute embolism and thrombosis of unspecified deep veins of unspecified lower extremity: Secondary | ICD-10-CM

## 2013-02-27 DIAGNOSIS — I2699 Other pulmonary embolism without acute cor pulmonale: Secondary | ICD-10-CM

## 2013-02-27 DIAGNOSIS — D6859 Other primary thrombophilia: Secondary | ICD-10-CM

## 2013-02-27 DIAGNOSIS — Z7901 Long term (current) use of anticoagulants: Secondary | ICD-10-CM

## 2013-02-27 DIAGNOSIS — I82401 Acute embolism and thrombosis of unspecified deep veins of right lower extremity: Secondary | ICD-10-CM

## 2013-02-27 LAB — PROTIME-INR: Protime: 45.6 Seconds — ABNORMAL HIGH (ref 10.6–13.4)

## 2013-02-27 LAB — POCT INR: INR: 3.8

## 2013-02-27 NOTE — Progress Notes (Signed)
INR = 3.8 after decreasing his dose to 5 mg daily on Monday 6/9. No bleeding.  He has a healing bruise L arm. He took his last dose of Lovenox on Monday at MD direction. INR above goal of 2.5-3.5 Decrease dose to 2.5 mg/5 mg alt until we see him on Monday 6/16. Ebony Hail, Pharm.D., CPP 02/27/2013@9 :25 AM

## 2013-03-03 ENCOUNTER — Other Ambulatory Visit (HOSPITAL_BASED_OUTPATIENT_CLINIC_OR_DEPARTMENT_OTHER): Payer: BC Managed Care – PPO

## 2013-03-03 ENCOUNTER — Ambulatory Visit: Payer: BC Managed Care – PPO | Admitting: Pharmacist

## 2013-03-03 DIAGNOSIS — I2699 Other pulmonary embolism without acute cor pulmonale: Secondary | ICD-10-CM

## 2013-03-03 DIAGNOSIS — I82409 Acute embolism and thrombosis of unspecified deep veins of unspecified lower extremity: Secondary | ICD-10-CM

## 2013-03-03 DIAGNOSIS — I82401 Acute embolism and thrombosis of unspecified deep veins of right lower extremity: Secondary | ICD-10-CM

## 2013-03-03 DIAGNOSIS — D6859 Other primary thrombophilia: Secondary | ICD-10-CM

## 2013-03-03 LAB — POCT INR: INR: 3.8

## 2013-03-03 LAB — PROTIME-INR: Protime: 45.6 Seconds — ABNORMAL HIGH (ref 10.6–13.4)

## 2013-03-03 NOTE — Progress Notes (Signed)
INR slightly above goal again today (INR exactly the same as last week) despite dose decrease. Patient reports he has followed dose decrease instructions correctly and does not have any medication changes or missed doses. Patient reports no bruising or bleeding. He says he has been taking it easy at work and has not gone back to full time yet as instructed by his physicians. He appears anxious this morning and does not want his INR to be above 3.5 (it is 3.8 today). Instructed patient to decrease dose again to 2.5 mg daily and we will recheck him again this week on Thursday 03/06/13 when he also sees Dr. Darnelle Catalan. Lab at 8am on 6/19, followed by MD appointment with Dr. Darnelle Catalan at 8:30am and coumadin clinic will be after MD appointment will schedule for 9am. Patient would also like to schedule appointment for the next week on Wednesday 03/12/13 at 8am. We will schedule this appointment as well

## 2013-03-03 NOTE — Patient Instructions (Addendum)
INR slightly above goal today  Change dose to 2.5 mg daily until Thursday 03/06/13  Lab at 8:00 am, appointment with Dr. Darnelle Catalan at 8:30am, we will see you after your appointment with Dr. Darnelle Catalan around 9 or 930am

## 2013-03-05 ENCOUNTER — Other Ambulatory Visit: Payer: Self-pay | Admitting: Physician Assistant

## 2013-03-05 DIAGNOSIS — I82409 Acute embolism and thrombosis of unspecified deep veins of unspecified lower extremity: Secondary | ICD-10-CM

## 2013-03-05 DIAGNOSIS — D6859 Other primary thrombophilia: Secondary | ICD-10-CM

## 2013-03-05 DIAGNOSIS — I2699 Other pulmonary embolism without acute cor pulmonale: Secondary | ICD-10-CM

## 2013-03-06 ENCOUNTER — Ambulatory Visit (HOSPITAL_BASED_OUTPATIENT_CLINIC_OR_DEPARTMENT_OTHER): Payer: BC Managed Care – PPO | Admitting: Oncology

## 2013-03-06 ENCOUNTER — Other Ambulatory Visit (HOSPITAL_BASED_OUTPATIENT_CLINIC_OR_DEPARTMENT_OTHER): Payer: BC Managed Care – PPO | Admitting: Lab

## 2013-03-06 ENCOUNTER — Telehealth: Payer: Self-pay | Admitting: Oncology

## 2013-03-06 ENCOUNTER — Ambulatory Visit: Payer: BC Managed Care – PPO | Admitting: Pharmacist

## 2013-03-06 VITALS — BP 130/76 | HR 84 | Temp 98.2°F | Resp 20 | Ht 69.0 in | Wt 173.5 lb

## 2013-03-06 DIAGNOSIS — D6859 Other primary thrombophilia: Secondary | ICD-10-CM

## 2013-03-06 DIAGNOSIS — I2699 Other pulmonary embolism without acute cor pulmonale: Secondary | ICD-10-CM

## 2013-03-06 DIAGNOSIS — I82409 Acute embolism and thrombosis of unspecified deep veins of unspecified lower extremity: Secondary | ICD-10-CM

## 2013-03-06 LAB — CBC WITH DIFFERENTIAL/PLATELET
BASO%: 0.6 % (ref 0.0–2.0)
EOS%: 6.5 % (ref 0.0–7.0)
HCT: 42.3 % (ref 38.4–49.9)
LYMPH%: 27.8 % (ref 14.0–49.0)
MCH: 30.1 pg (ref 27.2–33.4)
MCHC: 35.9 g/dL (ref 32.0–36.0)
MCV: 83.8 fL (ref 79.3–98.0)
MONO%: 7.8 % (ref 0.0–14.0)
NEUT%: 57.3 % (ref 39.0–75.0)
lymph#: 1.5 10*3/uL (ref 0.9–3.3)

## 2013-03-06 LAB — PROTIME-INR: INR: 3.8 — ABNORMAL HIGH (ref 2.00–3.50)

## 2013-03-06 NOTE — Progress Notes (Signed)
PT seen after MD appmt today INR=3.8 for third straight visit with dose decreases His goal is 2.5-3.5 Will hold one day and resume 2.5 mg daily He will be seen back in clinic next Wed, 03/12/13  at 8:00 am for lab and 8:15 cc

## 2013-03-06 NOTE — Progress Notes (Signed)
ID: Art Buff OB: 10/09/1955  MR#: [id number]  WUJ#[id number]    PCP: Tonye Pearson, MD GYN:    SU:   OTHER MD: Stan Head, Dow Adolph     HISTORY OF PRESENT ILLNESS: "Antonio [name]" is a 57 year-old [place] man with a history of Klinefelter's syndrome. He was diagnosed in 19 at [facility] and was started on testosterone supplementation then. More recently his testosterone level was found to be somewhat lower than expected and his dose was increased (April 2014). 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     A few weeks later, on 02/08/2013 he presented to Urgent Care with swelling in his Right leg and Dopplers showed acute deep vein thrombosis involving posterior tibial and peroneal veins, coursing through to the popliteal and extending to mid femoral veins of the right lower extremity. He was started on xarelto/ rivaroxaban, and purchsed 15 tablets initially for $142, then 27 tablets for $250 (he used a company coupon so actually only paid $5).   Repeat dopplers 02/14/2013 showed acute to subacute deep vein thrombosis involving the right femoral, popliteal, proximal posterior tibial, and proximal peroneal veins. This was felt to be at least stable. However this morning (02/19/2013) the patient developed some strange crawling feeling in his Right leg folowed by chest pressure. He presented to the ED and CT-angio was positive for pulmonary embolus with evidence of both acute and chronic thromboembolic disease in the lungs bilaterally. All filling defects in the pulmonary   arteries appeared to be nonocclusive. There was no evidence of pulmonary hemorrhage to suggest a frank pulmonary infarction.   Antonio [name] was admitted for IV heparinization and we were consulted for further management       INTERVAL HISTORY: Antonio [name] returns today for followup of his  coagulopathy. He has been working closely with our Coumadin clinic. She's also going back to work, with an Geophysicist/field seismologist. He is thinking that next week he will be 80% back and then the following week she will be pretty much doing everything by himself.   REVIEW OF SYSTEMS: He has gained 10 pounds in this short period of time, largely because he has not adjusted his diet to his decreased activity level. He continues to have some swelling in his right lower extremity as compared to the left, but is using his compression sleeves religiously. He gets short of breath when walking more than 200 yards and certainly in the heat these days is difficult for him. Walking up stairs is also harder than it used to be. There has been no bleeding of from the Coumadin and no unusual bruising. A detailed review of systems is otherwise noncontributory  PAST MEDICAL HISTORY: Past Medical History   Diagnosis  Date   .  GERD (gastroesophageal reflux disease)     .  Asthma     .  Klinefelter syndrome     .  Hyperlipidemia     .  Chronic headaches     .  Eosinophilic esophagitis     .  Seasonal allergies     .  Cervical spine degeneration     .  Cataract      PAST SURGICAL HISTORY: Past Surgical History   Procedure  Laterality  Date   .  Cholecystectomy           ERCP for cbd  stones   .  Tonsillectomy       .  Cataract surgery           bilateral   .  Hand tendon surgery           left, work comp   .  Ercp       .  Colonoscopy       .  Upper gastrointestinal endoscopy        FAMILY HISTORY Family History   Problem  Relation  Age of Onset   .  Colon cancer  Neg Hx     .  Irritable bowel syndrome  Sister     .  Allergies  Sister     .  Allergies  Mother      the patient's father drowned when the patient was 48 years old (rip tide at [facility]). The patient's mother is currently 58. The patient has one sister. 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 His wife [name] used to work at [employer] but is now retired, with significant COPD problems. She has 3 sons from a prior marriage ("a Information systems manager, a Corporate investment banker, and an optometrist"). They have 4 biological and 2 step grandchildren. The patient is not a church attender                          ADVANCED DIRECTIVES: in place   HEALTH MAINTENANCE: History   Substance Use Topics   .  Smoking status:  Passive Smoke Exposure - Never Smoker       Types:  Cigarettes   .  Smokeless tobacco:  Never Used   .  Alcohol Use:  No                Colonoscopy:             PSA:             Bone density:             Lipid panel:    Allergies   Allergen  Reactions   .  Codeine  Other (See Comments)       REACTION: difficulty breathing, nausea       Current Facility-Administered Medications   Medication  Dose  Route  Frequency  Provider  Last Rate  Last Dose   .  0.9 %  sodium chloride infusion     Intravenous  STAT  Rolan Bucco, MD  100 mL/hr at 02/19/13 1024      .  0.9 %  sodium chloride infusion   250 mL  Intravenous  PRN  Maryruth Bun Rama, MD         .  acetaminophen (TYLENOL) tablet 650 mg   650 mg  Oral  Q6H PRN  Maryruth Bun Rama, MD           Or   .  acetaminophen (TYLENOL) suppository 650 mg   650 mg  Rectal  Q6H PRN  Christina P Rama, MD         .  albuterol (PROVENTIL HFA;VENTOLIN HFA) 108 (90 BASE) MCG/ACT inhaler 2 puff   2 puff  Inhalation  Q4H PRN  Maryruth Bun Rama, MD         .  ALPRAZolam (XANAX) tablet 0.25 mg   0.25 mg  Oral  TID PRN  Maryruth Bun Rama, MD         .  alum & mag hydroxide-simeth (MAALOX/MYLANTA) 200-200-20 MG/5ML suspension 30 mL   30 mL  Oral  Q6H PRN  Christina P Rama, MD         .  heparin ADULT infusion 100 units/mL (25000 units/250 mL)   1,100 Units/hr  Intravenous   Continuous  Teressa Lower, RPH  11 mL/hr at 02/19/13 1453  1,100 Units/hr at 02/19/13 1453   .  loratadine (CLARITIN) tablet 10 mg   10 mg  Oral  Daily  Maryruth Bun Rama, MD     10 mg at 02/19/13 1229   .  montelukast (SINGULAIR) tablet 10 mg   10 mg  Oral  QHS  Christina P Rama, MD         .  multivitamin with minerals tablet 1 tablet   1 tablet  Oral  Daily  Maryruth Bun Rama, MD     1 tablet at 02/19/13 1229   .  ondansetron (ZOFRAN) tablet 4 mg   4 mg  Oral  Q6H PRN  Christina P Rama, MD           Or   .  ondansetron (ZOFRAN) injection 4 mg   4 mg  Intravenous  Q6H PRN  Christina P Rama, MD         .  pantoprazole (PROTONIX) EC tablet 40 mg   40 mg  Oral  Daily  Maryruth Bun Rama, MD     40 mg at 02/19/13 1229   .  polyethylene glycol (MIRALAX / GLYCOLAX) packet 17 g   17 g  Oral  Daily PRN  Christina P Rama, MD         .  sodium chloride 0.9 % injection 3 mL   3 mL  Intravenous  Q12H  Christina P Rama, MD         .  sodium chloride 0.9 % injection 3 mL   3 mL  Intravenous  Q12H  Maryruth Bun Rama, MD     3 mL at 02/19/13 1452   .  sodium chloride 0.9 % injection 3 mL   3 mL  Intravenous  PRN  Maryruth Bun Rama, MD         WARFARIN currently at 2.5 mg po QD   OBJECTIVE: middel aged [place] male examined in bed Filed Vitals:   03/06/13 0822  BP: 130/76  Pulse: 84  Temp: 98.2 F (36.8 C)  Resp: 20   Sclerae unicteric Oropharynx clear No cervical or supraclavicular adenopathy Lungs no rales or rhonchi, no wheezes, good excursion bilaterally Heart regular rate and rhythm, no rhythm appreciated Abd +BS, NT, no masses palpated MSK 1 + Right LE edema, bilateralcompression stockings in place Neuro: non-focal, well-oriented, appropriate affect  LAB RESULTS: Basic Metabolic Panel: No results found for this basename: NA, K, CL, CO2, GLUCOSE, BUN, CREATININE, CALCIUM, MG, PHOS,  in the last 168 hours GFR Estimated Creatinine Clearance: 73.6 ml/min (by C-G formula based on Cr of  1.12). Liver Function Tests: No results found for this basename: AST, ALT, ALKPHOS, BILITOT, PROT, ALBUMIN,  in the last 168 hours No results found for this basename: LIPASE, AMYLASE,  in the last 168 hours No results found for this basename: AMMONIA,  in the last 168 hours Coagulation profile  Recent Labs Lab 03/03/13 0900 03/03/13 0902 03/06/13  0805  INR 3.8 3.80* 3.80*  PROTIME  --  45.6* 45.6*    CBC:  Recent Labs Lab 03/06/13 0805  WBC 5.4  NEUTROABS 3.1  HGB 15.2  HCT 42.3  MCV 83.8  PLT 172    STUDIES: Ct Angio Chest Pe W/cm &/or Wo Cm   02/19/2013   *RADIOLOGY REPORT*  Clinical Data: Chest pain.  History of deep venous thrombosis. Evaluate for pulmonary embolism.  CT ANGIOGRAPHY CHEST  Technique:  Multidetector CT imaging of the chest using the standard protocol during bolus administration of intravenous contrast. Multiplanar reconstructed images including MIPs were obtained and reviewed to evaluate the vascular anatomy.  Contrast: 80mL OMNIPAQUE IOHEXOL 350 MG/ML SOLN  Comparison: No priors.  Findings:  Mediastinum: There are multiple filling defects in the pulmonary arteries bilaterally, compatible with pulmonary emboli.  Several of these are central while others are eccentric, suggesting a combination of acute and chronic thromboembolic disease.  The largest burden of clot extends from the distal right main pulmonary artery into the right lower lobe pulmonary artery, terminating in proximal segmental sized branches.  All of these filling defects appear to be nonocclusive at this time. Heart size is normal. There is no significant pericardial fluid, thickening or pericardial calcification. No pathologically enlarged mediastinal or hilar lymph nodes. Esophagus is unremarkable in appearance.  Lungs/Pleura: Mild dependent atelectasis in the lower lobes of the lungs bilaterally.  No acute consolidative airspace disease.  No pleural effusions.  No suspicious appearing pulmonary  nodules or masses.  Upper Abdomen: Status post cholecystectomy.  Musculoskeletal: There are no aggressive appearing lytic or blastic lesions noted in the visualized portions of the skeleton.  IMPRESSION: 1.  Study is positive for pulmonary embolus with evidence of both acute and chronic thromboembolic disease in the lungs bilaterally, as above.  At this time, all filling defects in the pulmonary arteries appear to be nonocclusive.  No evidence of pulmonary hemorrhage to suggest a frank pulmonary infarction at this time. 2.  Status post cholecystectomy.  Critical Value/emergent results were called by telephone at the time of interpretation on 02/19/2013 at 06:41 a.m. to Dr. Fredderick Phenix, who verbally acknowledged these results.   Original Report Authenticated By: Trudie Reed, M.D.    US Venous Img Lower Unilateral Right   02/12/2013   **ADDENDUM** CREATED: 02/12/2013 17:03:46  Compared to the ultrasound from the vascular lab on 05/20 12/2012, there is no significant change.  No evidence of proximal propagation of the femoral vein clot.  **END ADDENDUM** SIGNED BY: Genevive Bi, M.D.   02/11/2013   *RADIOLOGY REPORT*  Clinical Data: Positive DVT at Urgent Care.  Right LOWER EXTREMITY VENOUS DUPLEX ULTRASOUND  Technique:  Gray-scale sonography with graded compression, as well as color Doppler and duplex ultrasound were performed to evaluate the deep venous system of the lower extremity from the level of the common femoral vein through the popliteal and proximal calf veins. Spectral Doppler was utilized to evaluate flow at rest and with distal augmentation maneuvers.  Comparison:  None.  Findings:  Normal compressibility of the common femoral and profunda femoral vein.  Beginning in the femoral vein there is noncompressibility in the proximal aspect as well as mid distal aspect.  There is occlusive thrombus within the mid and distal superficial femoral vein.  This extends into the posterior tibial and peroneal  veins.  IMPRESSION: Deep venous thrombosis within the right femoral vein extending into the calf veins.  Original Report Authenticated By: Genevive Bi, M.D.  ASSESSMENT: 57 y.o. 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 She is working well with the Coumadin clinic here. Once his INR is stable he in the goal range his lab work will decrease to monthly and she will feel a little less tethered. Today I urged him to increase his activity slowly back to normal. If he got up he, or he could actually measure the increased activity level. I suggested he cut out carbohydrates and concentrate and vegetables, fruits, and meats, with very little desserts. He understands that inactivity increases the risk of clotting. I am going to see him again in October. We will consider repeating a CT angiogram some points to document whether she is body has been able to clear his pulmonary emboli or whether they have become chronic.  The plan for now is to continue Coumadin for 1 year. When I see him in April of 2015 a will probably refer him to Dr Isaiah Serge at [facility] to help [place] with a more complicated decision whether or Antonio [name] should be anticoagulated for what if.  Incidentally his wife who has a breast mass cannot tolerate mammography and they will not do an ultrasound without a mammogram. I have suggested she be referred to [facility] with special attention to Dr. Yolanda Bonine. If Antonio [name] alert week when of the referral has been made I will discuss the situation directly with Dr. Yolanda Bonine.  Lowella Dell, MD    03/06/2013 5:54 PM

## 2013-03-06 NOTE — Patient Instructions (Signed)
Hold dose today then change dose to 2.5 mg daily until Wednesday 03/12/13 Lab at 8:00 am and Coumdain Clinic at 8:15.

## 2013-03-12 ENCOUNTER — Other Ambulatory Visit (HOSPITAL_BASED_OUTPATIENT_CLINIC_OR_DEPARTMENT_OTHER): Payer: BC Managed Care – PPO | Admitting: Lab

## 2013-03-12 ENCOUNTER — Ambulatory Visit (HOSPITAL_BASED_OUTPATIENT_CLINIC_OR_DEPARTMENT_OTHER): Payer: BC Managed Care – PPO | Admitting: Pharmacist

## 2013-03-12 DIAGNOSIS — I82401 Acute embolism and thrombosis of unspecified deep veins of right lower extremity: Secondary | ICD-10-CM

## 2013-03-12 DIAGNOSIS — I2699 Other pulmonary embolism without acute cor pulmonale: Secondary | ICD-10-CM

## 2013-03-12 DIAGNOSIS — I82409 Acute embolism and thrombosis of unspecified deep veins of unspecified lower extremity: Secondary | ICD-10-CM

## 2013-03-12 DIAGNOSIS — D6859 Other primary thrombophilia: Secondary | ICD-10-CM

## 2013-03-12 LAB — PROTIME-INR: INR: 2.1 (ref 2.00–3.50)

## 2013-03-12 NOTE — Patient Instructions (Signed)
Take 5mg  on Wed and 2.5 mg other days. Return to clinic on 03/24/13 lab at 9:00 am and [facility] Clinic at 9:15.

## 2013-03-12 NOTE — Progress Notes (Signed)
Pt seen today in clinic. 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  He is wearing thigh high compression stockings. INR=2.1 Goal is 2.5-3.5 We will increase his dose to 5mg  on Wed and 2.5 mg other days. He will be driving 1[employer] all next week.  He will be seen on 03/24/13 at 9am No other changes to report.  He is instructed to call us if he has any problems next week while driving. He will be in [place].  I informed him we have an office over there and if he needed to have it done over there next week we could accommodate.

## 2013-03-13 ENCOUNTER — Telehealth: Payer: Self-pay

## 2013-03-13 NOTE — Telephone Encounter (Signed)
Patient came in today and dropped off x-ray disc from another facility and wanted Dr. Perrin Maltese to have it to put into epic. X-ray disc will be in Dr. Ernestene Mention box

## 2013-03-13 NOTE — Telephone Encounter (Signed)
We can not put a disk into epic. Disk in Dr Perrin Maltese box, should he want to review; Antonio [name]

## 2013-03-18 ENCOUNTER — Telehealth: Payer: Self-pay | Admitting: *Deleted

## 2013-03-18 NOTE — Telephone Encounter (Signed)
Pt called in requesting an appt with [facility] on 03/19/13. gv appt for lab@ 8:15am and coa@8 :30am. Pt is aware...td

## 2013-03-19 ENCOUNTER — Other Ambulatory Visit (HOSPITAL_BASED_OUTPATIENT_CLINIC_OR_DEPARTMENT_OTHER): Payer: BC Managed Care – PPO | Admitting: Lab

## 2013-03-19 ENCOUNTER — Ambulatory Visit (HOSPITAL_BASED_OUTPATIENT_CLINIC_OR_DEPARTMENT_OTHER): Payer: BC Managed Care – PPO | Admitting: Pharmacist

## 2013-03-19 DIAGNOSIS — I82401 Acute embolism and thrombosis of unspecified deep veins of right lower extremity: Secondary | ICD-10-CM

## 2013-03-19 DIAGNOSIS — I2699 Other pulmonary embolism without acute cor pulmonale: Secondary | ICD-10-CM

## 2013-03-19 DIAGNOSIS — I82409 Acute embolism and thrombosis of unspecified deep veins of unspecified lower extremity: Secondary | ICD-10-CM

## 2013-03-19 DIAGNOSIS — D6859 Other primary thrombophilia: Secondary | ICD-10-CM

## 2013-03-19 LAB — POCT INR: INR: 1.7

## 2013-03-19 NOTE — Progress Notes (Signed)
Testosterone injection only

## 2013-03-19 NOTE — Progress Notes (Signed)
INR below goal of 2.5-3.5 on 2.5mg  daily with 1 day of 5mg .  Mr Antonio [name] has not missed any coumadin doses and has no problems to report but just "felt off".  Will increase coumadin by about 25% to 5mg  MWF and 2.5mg  other days.  Mr Antonio [name] will keep his previous scheduled coumadin clinic appt for 03/24/13.

## 2013-03-24 ENCOUNTER — Encounter: Payer: Self-pay | Admitting: Pharmacist

## 2013-03-24 ENCOUNTER — Other Ambulatory Visit: Payer: Self-pay | Admitting: Pharmacist

## 2013-03-24 ENCOUNTER — Ambulatory Visit: Payer: BC Managed Care – PPO | Admitting: Pharmacist

## 2013-03-24 ENCOUNTER — Other Ambulatory Visit (HOSPITAL_BASED_OUTPATIENT_CLINIC_OR_DEPARTMENT_OTHER): Payer: BC Managed Care – PPO

## 2013-03-24 DIAGNOSIS — I82409 Acute embolism and thrombosis of unspecified deep veins of unspecified lower extremity: Secondary | ICD-10-CM

## 2013-03-24 DIAGNOSIS — I2699 Other pulmonary embolism without acute cor pulmonale: Secondary | ICD-10-CM

## 2013-03-24 DIAGNOSIS — I82401 Acute embolism and thrombosis of unspecified deep veins of right lower extremity: Secondary | ICD-10-CM

## 2013-03-24 DIAGNOSIS — D6859 Other primary thrombophilia: Secondary | ICD-10-CM

## 2013-03-24 LAB — PROTIME-INR
INR: 2.1 (ref 2.00–3.50)
Protime: 25.2 Seconds — ABNORMAL HIGH (ref 10.6–13.4)

## 2013-03-24 LAB — POCT INR: INR: 2.1

## 2013-03-24 NOTE — Progress Notes (Signed)
Pt called this afternoon to notify us that he started Levaquin today for upper resp tract infection along w/ Mucinex liq. I contacted [facility]- Levaquin 500 mg once daily x 5 days. Pt took 2.5 mg Coumadin yesterday & I changed his dose this week to 5 mg daily except 2.5 mg on Wed/Fri. He will take 5 mg daily except 2.5 mg on Wed/Fri & Sunday this week (slight decrease from my initial recommendation) in anticipation of increase in INR w/ addition of Levaquin. Return as planned 03/31/13 for protime check. Ebony Hail, Pharm.D., CPP 03/24/2013@3 :24 PM

## 2013-03-24 NOTE — Progress Notes (Signed)
INR = 2.1 after increasing dose last week to 2.5 mg/day; 5 mg MWF Pt has chest congestion.  He is going to pulmonologist today.  He has not taken any new meds/OTC's for his sxs. INR up from last week but not at goal. Increase Coumadin to 5 mg/day; 2.5 mg on Wed/Fri. Pt will call our pharmacy to inform us if pulmonologist changes any meds to manage his congestion. Recheck INR 1 week. Ebony Hail, Pharm.D., CPP 03/24/2013@9 :54 AM

## 2013-03-26 ENCOUNTER — Telehealth: Payer: Self-pay | Admitting: Pharmacist

## 2013-03-26 ENCOUNTER — Telehealth: Payer: Self-pay

## 2013-03-26 NOTE — Telephone Encounter (Signed)
Spoke to [place] this morning and he had decided NOT to take Levaquin after reading all the patient information. His Dr. Has switched him over to a Z-Pak.  While this may decrease the warfarin effect, length of therapy is short term And we will check his INR with his next visit on Monday 03/31/13.

## 2013-03-31 ENCOUNTER — Ambulatory Visit (HOSPITAL_BASED_OUTPATIENT_CLINIC_OR_DEPARTMENT_OTHER): Payer: BC Managed Care – PPO | Admitting: Pharmacist

## 2013-03-31 ENCOUNTER — Other Ambulatory Visit (HOSPITAL_BASED_OUTPATIENT_CLINIC_OR_DEPARTMENT_OTHER): Payer: BC Managed Care – PPO | Admitting: Lab

## 2013-03-31 DIAGNOSIS — I82401 Acute embolism and thrombosis of unspecified deep veins of right lower extremity: Secondary | ICD-10-CM

## 2013-03-31 DIAGNOSIS — I2699 Other pulmonary embolism without acute cor pulmonale: Secondary | ICD-10-CM

## 2013-03-31 DIAGNOSIS — I82409 Acute embolism and thrombosis of unspecified deep veins of unspecified lower extremity: Secondary | ICD-10-CM

## 2013-03-31 DIAGNOSIS — D6859 Other primary thrombophilia: Secondary | ICD-10-CM

## 2013-03-31 LAB — POCT INR: INR: 2.9

## 2013-03-31 NOTE — Progress Notes (Signed)
INR within goal today. Goal INR = 2.5-3.5 No problems to report regarding anticoagulation. Pt is taking Mucinex for upper respiratory cold symptoms. He may also take PRN Sudafed. We discussed various antihistamines and decongestants to help with cold/allergy symptoms. Pt has normal BP. He completed a 5-day Z-pak. Took last dose on 7/13. No other changes to report. Will continue the same coumadin dose and recheck INR in 2 weeks. Pt would prefer to recheck in 4 weeks. I told him if his INR is therapeutic in 2 weeks we can extend the monitoring interval to 4 weeks. Continue coumadin 5mg  daily except 2.5 mg on Wed/Fri. Return to clinic on 04/11/13 lab at 8:30 am and Coumadin Clinic at 8:45 am

## 2013-03-31 NOTE — Patient Instructions (Signed)
Continue coumadin 5mg  daily except 2.5 mg on Wed/Fri. Return to clinic on 04/11/13 lab at 8:30 am and Coumadin Clinic at 8:45 am

## 2013-04-01 NOTE — Telephone Encounter (Signed)
Error

## 2013-04-08 ENCOUNTER — Institutional Professional Consult (permissible substitution): Payer: Self-pay | Admitting: Emergency Medicine

## 2013-04-11 ENCOUNTER — Ambulatory Visit (HOSPITAL_BASED_OUTPATIENT_CLINIC_OR_DEPARTMENT_OTHER): Payer: BC Managed Care – PPO | Admitting: Pharmacist

## 2013-04-11 ENCOUNTER — Other Ambulatory Visit (HOSPITAL_BASED_OUTPATIENT_CLINIC_OR_DEPARTMENT_OTHER): Payer: BC Managed Care – PPO

## 2013-04-11 DIAGNOSIS — I82409 Acute embolism and thrombosis of unspecified deep veins of unspecified lower extremity: Secondary | ICD-10-CM

## 2013-04-11 DIAGNOSIS — D6859 Other primary thrombophilia: Secondary | ICD-10-CM

## 2013-04-11 DIAGNOSIS — I82401 Acute embolism and thrombosis of unspecified deep veins of right lower extremity: Secondary | ICD-10-CM

## 2013-04-11 DIAGNOSIS — I2699 Other pulmonary embolism without acute cor pulmonale: Secondary | ICD-10-CM

## 2013-04-11 LAB — PROTIME-INR
INR: 4.6 — ABNORMAL HIGH (ref 2.00–3.50)
Protime: 55.2 s — ABNORMAL HIGH (ref 10.6–13.4)

## 2013-04-11 LAB — POCT INR: INR: 4.6

## 2013-04-11 NOTE — Progress Notes (Signed)
INR above goal today. No problems to report regarding anticoagulation. Pt was stung by a group of yellow jackets on his right ear and upper neck/back about 2 days ago. No problems from the incident. Right ear is slightly swollen and red. Pt going to PCP today. His sister is seriously allergic to these stings. Pt has taken coumadin as instructed. No changes in medications, diet, etc. No missed doses. Will hold coumadin today (04/11/13).  On 04/12/13, slightly decrease coumadin 5mg  on SuTuThu and 2.5 mg on the other days.   Return to clinic on 04/21/13 lab at 8:30am and Coumadin Clinic at 8:45am.

## 2013-04-11 NOTE — Patient Instructions (Addendum)
Hold coumadin today (04/11/13).  On 04/12/13, slightly decrease coumadin 5mg  on SuTuThu and 2.5 mg on the other days.   Return to clinic on 04/21/13 lab at 8:30am and Coumadin Clinic at 8:45am.

## 2013-04-15 ENCOUNTER — Other Ambulatory Visit: Payer: Self-pay | Admitting: Oncology

## 2013-04-15 DIAGNOSIS — D6859 Other primary thrombophilia: Secondary | ICD-10-CM

## 2013-04-18 ENCOUNTER — Telehealth: Payer: Self-pay | Admitting: Pharmacist

## 2013-04-18 ENCOUNTER — Ambulatory Visit (INDEPENDENT_AMBULATORY_CARE_PROVIDER_SITE_OTHER): Payer: BC Managed Care – PPO | Admitting: Emergency Medicine

## 2013-04-18 VITALS — BP 110/60 | HR 89 | Temp 98.0°F | Resp 18 | Ht 69.0 in | Wt 175.0 lb

## 2013-04-18 DIAGNOSIS — J45909 Unspecified asthma, uncomplicated: Secondary | ICD-10-CM

## 2013-04-18 DIAGNOSIS — E349 Endocrine disorder, unspecified: Secondary | ICD-10-CM

## 2013-04-18 DIAGNOSIS — I2699 Other pulmonary embolism without acute cor pulmonale: Secondary | ICD-10-CM

## 2013-04-18 DIAGNOSIS — E291 Testicular hypofunction: Secondary | ICD-10-CM

## 2013-04-18 NOTE — Progress Notes (Signed)
  Subjective:    Patient ID: Antonio [name], male    DOB: 1956-06-16, 57 y.o.   MRN: [id number]  HPI  Patient following up from recent DVT and pulmonary embolism. He indicates he is doing well. Had pulmonologist perform ultrasound of right leg and was told one clot was resolving but another was still present. Patient is on Coumadin currently and was told never to have testosterone again. Has been on Testosterone replacement since 1975. They will consider letting him use Androgel again, pending his results of next INR, He indicates his last INR was 4.6 he indicates dose has been adjusted M W Sat he takes 2.5 mg and the other days of the week he is using 5mg  tablets. Patient has not recently seen an endocrinologist for his Testosterone deficiency. Patient advised his case is very complicated and he is at high risk for blood clots. He is agreeable to an endocrinology consult. Patient advised also he is not back to full duty at work. Have advised him to return to work, but only to work 4 hour shifts.   Review of Systems     Objective:   Physical Exam patient is alert and cooperative he is in no distress. His neck is supple. His chest is clear to auscultation and percussion. His heart was regular rate without murmurs. He has 2+ swelling of the right leg and has his support stocking on that his right leg        Assessment & Plan:  Continue current treatment per coumadin clinic See endocrinologist for evaluation of testosterone deficiency. Needs venous doppler to evaluate DVT this month.

## 2013-04-18 NOTE — Telephone Encounter (Signed)
Pt called to state that his urologist would like him to start back on testosterone (the patient would like to restart as well).  Pt has been off testosterone since May. It is believed his PE was likely due to an increase in his testosterone dose (given by injection).  Dr. Darnelle Catalan is ok with patient restarting the testosterone at the lowest possible dose. Pt and urologist would like to avoid injections secondary to the possibility of hematomas with a higher INR goal. Urologist is recommending AndroGel. Pt has not been on testosterone since starting warfarin. There is a significant interaction between AndroGel and warfarin. Pt last INR was = 4.6. He will return on 04/21/13 to confirm INR has decreased. Pt will wait to start AndroGel until after 04/21/13 INR check. Pt is aware he will require close/frequent INR monitoring once starting AndroGel. He may require frequent dose adjustments. Pt should be diligent in monitoring for excessive bruising, any bleeding. The below information is from [facility] regarding the interaction between warfarin and androgel. Pt may require a warfarin dose reduction of 80%. Pt is aware of this information. ++++++++++++++++++++++++++++++++++++++++++++++++++++++++++++++++++++++++++++++++++++++++++++++++++++++++++++++++++++++++++++++++++++++++++++++++++++++++++++++++++++++++++++++++ Title Vitamin K Antagonists / Androgens  Risk Rating D: Consider therapy modification  Summary Androgens may enhance the anticoagulant effect of Vitamin K Antagonists. Severity Moderate Onset Rapid Reliability Rating Excellent  Patient Management Monitor for increased therapeutic effects of coumarin derivatives if an androgen is initiated/dose increased, or decreased effects if an androgen is discontinued/dose decreased. Significant reductions in anticoagulant dosage may be needed during concomitant therapy.  Vitamin K Antagonists Interacting Members Acenocoumarol; Warfarin  Androgens Interacting Members  Danazol*; Fluoxymesterone; MethylTESTOSTERone*; Nandrolone; Oxandrolone*; Oxymetholone*; Testosterone*  * Denotes agent(s) specifically implicated in clinical data. Unmarked agents are listed because they have properties similar to marked agents, and may respond so within the context of the stated interaction.  Discussion Prothrombin time increased to 168 seconds within 3 weeks in a patient following initiation of danazol (400 mg/day).1 Other reports involving danazol have demonstrated hypoprothrombinemia within 2 days of starting danazol.2,3 Oxymethalone administration (15 mg/day) was associated with supratherapeutic anticoagulant effects in 6 patients taking either warfarin or phenindione.4 The AUC of S-warfarin was increased more than 2.5-fold in 15 normal subjects when coadministered with oxandrolone (5mg  or 10mg  twice daily).5 Participants required a reduction in warfarin dosage of approximately 80% to maintain target INR. Several additional reports including these and other androgens (stanozolol, methyltestosterone, and testosterone vaginal ointment) with warfarin, dicumarol, and other anticoagulants, have been published.6,7,8,9,10 The mechanism of these effects is unknown. Proposed mechanisms include: Androgen-induced increases in antithrombin III or protein C; or decreased synthesis/increased destruction of clotting factors

## 2013-04-21 ENCOUNTER — Ambulatory Visit (HOSPITAL_BASED_OUTPATIENT_CLINIC_OR_DEPARTMENT_OTHER): Payer: BC Managed Care – PPO | Admitting: Pharmacist

## 2013-04-21 ENCOUNTER — Other Ambulatory Visit (HOSPITAL_BASED_OUTPATIENT_CLINIC_OR_DEPARTMENT_OTHER): Payer: BC Managed Care – PPO

## 2013-04-21 DIAGNOSIS — D6859 Other primary thrombophilia: Secondary | ICD-10-CM

## 2013-04-21 DIAGNOSIS — I2699 Other pulmonary embolism without acute cor pulmonale: Secondary | ICD-10-CM

## 2013-04-21 DIAGNOSIS — I82409 Acute embolism and thrombosis of unspecified deep veins of unspecified lower extremity: Secondary | ICD-10-CM

## 2013-04-21 DIAGNOSIS — I82401 Acute embolism and thrombosis of unspecified deep veins of right lower extremity: Secondary | ICD-10-CM

## 2013-04-21 LAB — PROTIME-INR

## 2013-04-21 NOTE — Patient Instructions (Signed)
Take 5mg  coumadin today. Then begin with 2.5 mg on MWF and 5 mg other days.  Return to clinic on 05/06/13 lab at 8:00am and Coumadin Clinic at 8:15am.

## 2013-04-21 NOTE — Progress Notes (Signed)
Pt seen in clinic today. INR=2.0 on MWSa 2.5 mg and 5 mg other days No changes to report. No missed doses "that he can recall."  He states he desperately needs to go back on testosterone.  His last injection was 01/30/13.  He has been getting injections for 40 years.  He was told by PCP (?) he can not get inj while on coumadin due to risk of hematoma at inj site.  He was prescribed, but has not started, andorgel.  He wanted to discuss with [place] first.  Informed him it can thin his blood, but at the same time horomones can put you at risk for clotting.  He is expecting an appmt with endo this week.  He will not start the androgel without [place] being notified.  He will RTC in 2 weeks.  We will see sooner if he begins therapy.     His INR's have really been inconsistent with little to no tweeks in dose.  He will take 5mg  today, then begin 2.5 mg on MWF and 5 mg other days.  He will notify us prior to his next appmt if he decides to begin Andro-gel.      RTC on 05/06/13 at 8:00

## 2013-04-23 ENCOUNTER — Telehealth: Payer: Self-pay

## 2013-04-23 NOTE — Telephone Encounter (Signed)
Pt states when he was seen on the 1 that he was suppose to get a referral for another ultrasound on his leg I didn't see one about it. The patient would like to be able to get a referral for that Call back number is [phone]

## 2013-04-23 NOTE — Telephone Encounter (Signed)
It is under CV procedure, since it is a venous doppler, was ordered, did it myself, in the exam room. Thanks Amy

## 2013-04-24 ENCOUNTER — Other Ambulatory Visit: Payer: Self-pay | Admitting: Physician Assistant

## 2013-04-24 DIAGNOSIS — I82401 Acute embolism and thrombosis of unspecified deep veins of right lower extremity: Secondary | ICD-10-CM

## 2013-05-06 ENCOUNTER — Ambulatory Visit (HOSPITAL_BASED_OUTPATIENT_CLINIC_OR_DEPARTMENT_OTHER): Payer: BC Managed Care – PPO | Admitting: Pharmacist

## 2013-05-06 ENCOUNTER — Other Ambulatory Visit (HOSPITAL_BASED_OUTPATIENT_CLINIC_OR_DEPARTMENT_OTHER): Payer: BC Managed Care – PPO

## 2013-05-06 DIAGNOSIS — D6859 Other primary thrombophilia: Secondary | ICD-10-CM

## 2013-05-06 DIAGNOSIS — I2699 Other pulmonary embolism without acute cor pulmonale: Secondary | ICD-10-CM

## 2013-05-06 DIAGNOSIS — I82401 Acute embolism and thrombosis of unspecified deep veins of right lower extremity: Secondary | ICD-10-CM

## 2013-05-06 DIAGNOSIS — I82409 Acute embolism and thrombosis of unspecified deep veins of unspecified lower extremity: Secondary | ICD-10-CM

## 2013-05-06 LAB — PROTIME-INR: INR: 3.2 (ref 2.00–3.50)

## 2013-05-06 NOTE — Progress Notes (Signed)
Pt seen in clinic today INR=3.2 on 5mg  daily except 2.5 mg on MWF He has an appmt on Thur regarding restarting his testosterone.  He is debating between the gel and patch. No sure he definitely will restart.  IF he does he plans to call us and move his INR check up a few weeks. He also confirmed he is to stay off his ASA 81 mg No other questions.   He will RTC on 06/03/13 at 8am

## 2013-05-06 NOTE — Patient Instructions (Signed)
Continue Coumadin 2.5 mg on MWF and 5 mg other days.  Return to clinic on 06/03/13 lab at 8:00am and Coumadin Clinic at 8:15am.

## 2013-05-08 ENCOUNTER — Encounter: Payer: Self-pay | Admitting: Endocrinology

## 2013-05-08 ENCOUNTER — Ambulatory Visit (INDEPENDENT_AMBULATORY_CARE_PROVIDER_SITE_OTHER): Payer: BC Managed Care – PPO | Admitting: Endocrinology

## 2013-05-08 VITALS — BP 130/70 | HR 80 | Ht 69.0 in | Wt 177.0 lb

## 2013-05-08 DIAGNOSIS — Q984 Klinefelter syndrome, unspecified: Secondary | ICD-10-CM

## 2013-05-08 LAB — FOLLICLE STIMULATING HORMONE: FSH: 44.6 m[IU]/mL — ABNORMAL HIGH (ref 1.4–18.1)

## 2013-05-08 LAB — LUTEINIZING HORMONE: LH: 27.22 m[IU]/mL — ABNORMAL HIGH (ref 1.50–9.30)

## 2013-05-08 NOTE — Patient Instructions (Addendum)
blood tests are being requested for you today.  We'll contact you with results.  In terms of when and how much androgel to give, i'll have to ask dr Darnelle Catalan.

## 2013-05-08 NOTE — Progress Notes (Signed)
Subjective:    Patient ID: Antonio [name], male    DOB: 03-02-1956, 57 y.o.   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  He has been on testosterone supplementation since then, until he was dx'ed with DVT, and later and PE, approx 3 mos ago.  He has been off testosterone since then.  He now has weight gain, throughout the body, and associated ED sxs.   Past Medical History  Diagnosis Date  . GERD (gastroesophageal reflux disease)   . Asthma   . Klinefelter syndrome   . Hyperlipidemia   . Chronic headaches   . Eosinophilic esophagitis   . Seasonal allergies   . Cervical spine degeneration   . Cataract     Past Surgical History  Procedure Laterality Date  . Cholecystectomy      ERCP for cbd stones  . Tonsillectomy    . Cataract surgery      bilateral  . Hand tendon surgery      left, work comp  . Ercp    . Colonoscopy    . Upper gastrointestinal endoscopy      History   Social History  . Marital Status: Married    Spouse Name: [name]    Number of Children: 0  . Years of Education: 16   Occupational History  . DRIVER    Social History Main Topics  . Smoking status: Passive Smoke Exposure - Never Smoker    Types: Cigarettes  . Smokeless tobacco: Never Used  . Alcohol Use: No  . Drug Use: No  . Sexual Activity: Yes    Partners: Female   Other Topics Concern  . Not on file   Social History Narrative   Lives with his wife. She has been diagnosed with COPD and continues to smoke.    Current Outpatient Prescriptions on File Prior to Visit  Medication Sig Dispense Refill  . loratadine (CLARITIN) 10 MG tablet Take 10 mg by mouth daily.      . montelukast (SINGULAIR) 10 MG tablet TAKE 1 TABLET BY MOUTH ONCE DAILY  30 tablet  11  . Multiple Vitamins-Minerals (CENTRUM SILVER PO) Take 1 tablet by mouth daily.      . pantoprazole (PROTONIX) 40 MG tablet Take 40 mg by mouth daily.      Marland Kitchen PROAIR HFA 108 (90 BASE) MCG/ACT  inhaler       . warfarin (COUMADIN) 5 MG tablet TAKE 1 TABLET BY MOUTH ONCE A DAY OR AS DIRECTED  30 tablet  8   Current Facility-Administered Medications on File Prior to Visit  Medication Dose Route Frequency Provider Last Rate Last Dose  . testosterone cypionate (DEPOTESTOTERONE CYPIONATE) injection 200 mg  200 mg Intramuscular Q14 Days Chelle S Jeffery, PA-C   200 mg at 01/08/13 2053    Allergies  Allergen Reactions  . Codeine Other (See Comments)    REACTION: difficulty breathing, nausea    Family History  Problem Relation Age of Onset  . Colon cancer Neg Hx   . Irritable bowel syndrome Sister   . Allergies Sister   . Allergies Mother    BP 130/70  Pulse 80  Ht 5\' 9"  (1.753 m)  Wt 177 lb (80.287 kg)  BMI 26.13 kg/m2  SpO2 97%  Review of Systems denies polyuria, depression, numbness, fever, decreased urinary stream, myalgias,headache, easy bruising, sob, rash, blurry vision, rhinorrhea, chest pain.  He has gynecomastia and headache.     Objective:  Physical Exam VS: see vs page GEN: no distress HEAD: head: no deformity eyes: no periorbital swelling, no proptosis external nose and ears are normal mouth: no lesion seen NECK: supple, thyroid is not enlarged CHEST WALL: no deformity LUNGS: clear to auscultation BREASTS:  Mild bilateral gynecomastia CV: reg rate and rhythm, no murmur ABD: abdomen is soft, nontender.  no hepatosplenomegaly.  not distended.  1+ bilat leg hernia.   GENITALIA:  Normal male, except testes are very small.  MUSCULOSKELETAL: muscle bulk and strength are grossly normal.  no obvious joint swelling.  gait is normal and steady EXTEMITIES: no deformity.  no edema PULSES: no carotid bruit.   NEURO:  cn 2-12 grossly intact.   readily moves all 4's.  sensation is intact to touch on the feet.   SKIN:  Normal texture and temperature.  No rash or suspicious lesion is visible.   NODES:  None palpable at the neck PSYCH: alert, oriented x3.  Does not  appear anxious nor depressed.   FSH and LH are high Lab Results  Component Value Date   TESTOSTERONE 18.66* 05/08/2013      Assessment & Plan:  Klinefelter's syndrome, with resultant primary hypogonadism.   DVT.   This risk of recurrence must be weighted against the benefit of replacement.  i've asked Dr Darnelle Catalan about this. Fatigue.  Pt says this is due to hypogonadism. Weight gain.   This can exacerbate the fatigue.   Gynecomastia, due to the hypogonadism

## 2013-05-13 ENCOUNTER — Telehealth: Payer: Self-pay | Admitting: Endocrinology

## 2013-05-13 ENCOUNTER — Ambulatory Visit (HOSPITAL_COMMUNITY)
Admission: RE | Admit: 2013-05-13 | Discharge: 2013-05-13 | Disposition: A | Discharge status: Home / Self Care | Payer: BC Managed Care – PPO | Source: Ambulatory Visit | Attending: Physician Assistant | Admitting: Physician Assistant

## 2013-05-13 ENCOUNTER — Telehealth: Payer: Self-pay | Admitting: Radiology

## 2013-05-13 DIAGNOSIS — I82401 Acute embolism and thrombosis of unspecified deep veins of right lower extremity: Secondary | ICD-10-CM

## 2013-05-13 DIAGNOSIS — M7989 Other specified soft tissue disorders: Secondary | ICD-10-CM

## 2013-05-13 DIAGNOSIS — I825Y9 Chronic embolism and thrombosis of unspecified deep veins of unspecified proximal lower extremity: Secondary | ICD-10-CM | POA: Insufficient documentation

## 2013-05-13 DIAGNOSIS — Z7901 Long term (current) use of anticoagulants: Secondary | ICD-10-CM | POA: Insufficient documentation

## 2013-05-13 NOTE — Telephone Encounter (Signed)
Pt states ins will cover androgel

## 2013-05-13 NOTE — Telephone Encounter (Signed)
please call patient: Dr Darnelle Catalan has given the go-ahead to resume the testosterone. Do you want androgel, or another brand?

## 2013-05-13 NOTE — Progress Notes (Signed)
VASCULAR LAB PRELIMINARY  PRELIMINARY  PRELIMINARY  PRELIMINARY  Right lower extremity venous duplex completed.    Preliminary report:  Positive for a subacute to chronic DVT noted in the popliteal vein. All other areas of the previous DVT appear to have resolved  Kelisha Dall, RVS 05/13/2013, 1:21 PM

## 2013-05-13 NOTE — Addendum Note (Signed)
Addended byCaffie Damme on: 05/13/2013 11:26 AM   Modules accepted: Orders

## 2013-05-13 NOTE — Telephone Encounter (Signed)
Patient at vascular lab for f/u doppler lower ext, cancelled upper extremity order, and ordered lower right extremity. To you FYI

## 2013-05-14 ENCOUNTER — Other Ambulatory Visit: Payer: Self-pay | Admitting: Endocrinology

## 2013-05-14 ENCOUNTER — Other Ambulatory Visit: Payer: Self-pay | Admitting: Physician Assistant

## 2013-05-14 MED ORDER — TESTOSTERONE 20.25 MG/1.25GM (1.62%) TD GEL: 1.2500 g | Freq: Every day | TRANSDERMAL | Status: DC

## 2013-05-14 NOTE — Telephone Encounter (Signed)
i printed Please come back for a follow-up appointment in 1 month.

## 2013-05-14 NOTE — Telephone Encounter (Signed)
rx faxed pt advised to make 1 month f/u appt

## 2013-05-16 ENCOUNTER — Telehealth: Payer: Self-pay | Admitting: Pharmacist

## 2013-05-16 NOTE — Telephone Encounter (Signed)
Pt started AndroGel which can interact with his coumadin. See telephone note from 04/18/13. Will recheck INR in 1 week on 05/23/13. Will likely need to decrease his coumadin dose.

## 2013-05-19 DIAGNOSIS — G459 Transient cerebral ischemic attack, unspecified: Secondary | ICD-10-CM

## 2013-05-19 HISTORY — DX: Transient cerebral ischemic attack, unspecified: G45.9

## 2013-05-21 ENCOUNTER — Telehealth: Payer: Self-pay | Admitting: Endocrinology

## 2013-05-21 ENCOUNTER — Telehealth: Payer: Self-pay

## 2013-05-21 NOTE — Telephone Encounter (Signed)
Pt has questions about the endocrinology and urology referrals that we made for him, and he has an appt tomorrow and wants to know if this is going to be basically a repeat appt from the urology they both are prescribing him the same  Thing.  Best number [phone]

## 2013-05-21 NOTE — Telephone Encounter (Signed)
Pt advised.

## 2013-05-21 NOTE — Telephone Encounter (Signed)
The chromosome analysis is the next step.  You had the blood drawn, but the result is not yet available.   Please come back here for a f/u appt when you have been on the androgel x 4-6 weeks.

## 2013-05-23 ENCOUNTER — Ambulatory Visit (HOSPITAL_BASED_OUTPATIENT_CLINIC_OR_DEPARTMENT_OTHER): Payer: BC Managed Care – PPO | Admitting: Pharmacist

## 2013-05-23 ENCOUNTER — Other Ambulatory Visit (HOSPITAL_BASED_OUTPATIENT_CLINIC_OR_DEPARTMENT_OTHER): Payer: BC Managed Care – PPO | Admitting: Lab

## 2013-05-23 DIAGNOSIS — I2699 Other pulmonary embolism without acute cor pulmonale: Secondary | ICD-10-CM

## 2013-05-23 DIAGNOSIS — I82401 Acute embolism and thrombosis of unspecified deep veins of right lower extremity: Secondary | ICD-10-CM

## 2013-05-23 DIAGNOSIS — D6859 Other primary thrombophilia: Secondary | ICD-10-CM

## 2013-05-23 DIAGNOSIS — I82409 Acute embolism and thrombosis of unspecified deep veins of unspecified lower extremity: Secondary | ICD-10-CM

## 2013-05-23 LAB — CHROMOSOME ANALYSIS, PERIPHERAL BLOOD

## 2013-05-23 LAB — PROTIME-INR: Protime: 37.2 Seconds — ABNORMAL HIGH (ref 10.6–13.4)

## 2013-05-23 NOTE — Patient Instructions (Signed)
Continue Coumadin 2.5 mg on MWF and 5 mg other days.  Return to clinic on 06/09/13 lab at 9:00am and Coumadin Clinic at 9:15am.

## 2013-05-23 NOTE — Progress Notes (Signed)
Pt seen in clinic today. INR=3.1 after starting androgel He is on 2.5 mg MWF and 5 mg other days Continue same dose  RTC on 06/09/13 at 9:00 for lab and cc at 9:15  Pt had no other concerns or changes to report.

## 2013-05-26 ENCOUNTER — Ambulatory Visit (INDEPENDENT_AMBULATORY_CARE_PROVIDER_SITE_OTHER): Payer: BC Managed Care – PPO | Admitting: Emergency Medicine

## 2013-05-26 VITALS — BP 132/84 | HR 91 | Temp 98.0°F | Resp 17 | Ht 69.0 in | Wt 180.0 lb

## 2013-05-26 DIAGNOSIS — J309 Allergic rhinitis, unspecified: Secondary | ICD-10-CM

## 2013-05-26 DIAGNOSIS — J029 Acute pharyngitis, unspecified: Secondary | ICD-10-CM

## 2013-05-26 LAB — POCT RAPID STREP A (OFFICE): Rapid Strep A Screen: NEGATIVE

## 2013-05-26 MED ORDER — FLUTICASONE PROPIONATE 50 MCG/ACT NA SUSP: NASAL | Status: DC

## 2013-05-26 NOTE — Patient Instructions (Addendum)
Allergic Rhinitis Allergic rhinitis is when the mucous membranes in the nose respond to allergens. Allergens are particles in the air that cause your body to have an allergic reaction. This causes you to release allergic antibodies. Through a chain of events, these eventually cause you to release histamine into the blood stream (hence the use of antihistamines). Although meant to be protective to the body, it is this release that causes your discomfort, such as frequent sneezing, congestion and an itchy runny nose.  CAUSES  The pollen allergens may come from grasses, trees, and weeds. This is seasonal allergic rhinitis, or "hay fever." Other allergens cause year-round allergic rhinitis (perennial allergic rhinitis) such as house dust mite allergen, pet dander and mold spores.  SYMPTOMS   Nasal stuffiness (congestion).  Runny, itchy nose with sneezing and tearing of the eyes.  There is often an itching of the mouth, eyes and ears. It cannot be cured, but it can be controlled with medications. DIAGNOSIS  If you are unable to determine the offending allergen, skin or blood testing may find it. TREATMENT   Avoid the allergen.  Medications and allergy shots (immunotherapy) can help.  Hay fever may often be treated with antihistamines in pill or nasal spray forms. Antihistamines block the effects of histamine. There are over-the-counter medicines that may help with nasal congestion and swelling around the eyes. Check with your caregiver before taking or giving this medicine. If the treatment above does not work, there are many new medications your caregiver can prescribe. Stronger medications may be used if initial measures are ineffective. Desensitizing injections can be used if medications and avoidance fails. Desensitization is when a patient is given ongoing shots until the body becomes less sensitive to the allergen. Make sure you follow up with your caregiver if problems continue. SEEK MEDICAL  CARE IF:   You develop fever (more than 100.5 F (38.1 C).  You develop a cough that does not stop easily (persistent).  You have shortness of breath.  You start wheezing.  Symptoms interfere with normal daily activities. Document Released: 05/30/2001 Document Revised: 11/27/2011 Document Reviewed: 12/09/2008 Exit[employer] Patient Information 2014 [employer].  

## 2013-05-26 NOTE — Progress Notes (Signed)
  Subjective:    Patient ID: Antonio [name], male    DOB: 09/08/1956, 57 y.o.   MRN: [id number]  HPI PT complains of head congestion, sore throat, wheezing, difficulty breathing. Having drainage going down the back of his throat. This started on Saturday. His eustachian tubes are clogged. He has not been using any kind of nasal sprays.     Review of Systems     Objective:   Physical Exam TMs are occluded with wax. Nose is congested with blue swollen turbinates. Patient is status post tonsillectomy in the posterior pharynx is slightly red. Neck is supple his chest is clear to auscultation and percussion.  Results for orders placed in visit on 05/26/13  POCT RAPID STREP A (OFFICE)      Result Value Range   Rapid Strep A Screen Negative  Negative        Assessment & Plan:  We'll go ahead and check a strep test. I suspect the symptoms are allergy related. We'll treat with Flonase.

## 2013-06-03 ENCOUNTER — Ambulatory Visit: Payer: Self-pay

## 2013-06-03 ENCOUNTER — Other Ambulatory Visit: Payer: Self-pay | Admitting: Lab

## 2013-06-05 ENCOUNTER — Other Ambulatory Visit: Payer: Self-pay | Admitting: *Deleted

## 2013-06-05 ENCOUNTER — Ambulatory Visit (HOSPITAL_BASED_OUTPATIENT_CLINIC_OR_DEPARTMENT_OTHER): Payer: BC Managed Care – PPO | Admitting: Lab

## 2013-06-05 ENCOUNTER — Ambulatory Visit (HOSPITAL_BASED_OUTPATIENT_CLINIC_OR_DEPARTMENT_OTHER): Payer: BC Managed Care – PPO | Admitting: Pharmacist

## 2013-06-05 ENCOUNTER — Ambulatory Visit (HOSPITAL_COMMUNITY)
Admission: RE | Admit: 2013-06-05 | Discharge: 2013-06-05 | Disposition: A | Discharge status: Home / Self Care | Payer: BC Managed Care – PPO | Source: Ambulatory Visit | Attending: Oncology | Admitting: Oncology

## 2013-06-05 DIAGNOSIS — M79609 Pain in unspecified limb: Secondary | ICD-10-CM

## 2013-06-05 DIAGNOSIS — I2699 Other pulmonary embolism without acute cor pulmonale: Secondary | ICD-10-CM

## 2013-06-05 DIAGNOSIS — R252 Cramp and spasm: Secondary | ICD-10-CM | POA: Insufficient documentation

## 2013-06-05 DIAGNOSIS — D6859 Other primary thrombophilia: Secondary | ICD-10-CM

## 2013-06-05 DIAGNOSIS — I82401 Acute embolism and thrombosis of unspecified deep veins of right lower extremity: Secondary | ICD-10-CM

## 2013-06-05 DIAGNOSIS — I82409 Acute embolism and thrombosis of unspecified deep veins of unspecified lower extremity: Secondary | ICD-10-CM

## 2013-06-05 DIAGNOSIS — Z7901 Long term (current) use of anticoagulants: Secondary | ICD-10-CM

## 2013-06-05 DIAGNOSIS — Z5181 Encounter for therapeutic drug level monitoring: Secondary | ICD-10-CM

## 2013-06-05 DIAGNOSIS — Z86718 Personal history of other venous thrombosis and embolism: Secondary | ICD-10-CM | POA: Insufficient documentation

## 2013-06-05 LAB — PROTIME-INR: INR: 2.7 (ref 2.00–3.50)

## 2013-06-05 NOTE — Progress Notes (Signed)
Verbal report that todays doppler study reveals chronic clot in right lower ext, slightly better flow. Patient back to office to discuss results and plan with Dr Darnelle Catalan.

## 2013-06-05 NOTE — Progress Notes (Signed)
*  PRELIMINARY RESULTS* Vascular Ultrasound Left lower extremity venous duplex has been completed.  Preliminary findings:  Evidence of subacute to chronic DVT involving the distal femoral vein, popliteal vein, and proximal posterior tibial veins. No evidence of acute DVT.    Farrel Demark, RDMS, RVT  06/05/2013, 10:45 AM

## 2013-06-05 NOTE — Progress Notes (Signed)
Pt came in for unscheduled lab/Coumadin clinic visit today due to being awakened this AM w/ pain in RLE (interior calf). No SOB or CP Doppler performed today.  Negative for acute DVT.  Chronic DVT present. INR = 2.7 on Coumadin 5 mg/day; 2.5 mg MWF Pt is on Testosterone gel. INR at goal.  No change to dose (MD aware of plan). Repeat INR in 4 weeks. Ebony Hail, Pharm.D., CPP 06/05/2013@1 :02 PM

## 2013-06-09 ENCOUNTER — Other Ambulatory Visit: Payer: Self-pay | Admitting: Lab

## 2013-06-09 ENCOUNTER — Ambulatory Visit: Payer: Self-pay

## 2013-07-07 ENCOUNTER — Other Ambulatory Visit (HOSPITAL_BASED_OUTPATIENT_CLINIC_OR_DEPARTMENT_OTHER): Payer: BC Managed Care – PPO | Admitting: Lab

## 2013-07-07 ENCOUNTER — Other Ambulatory Visit: Payer: Self-pay | Admitting: Lab

## 2013-07-07 ENCOUNTER — Ambulatory Visit (HOSPITAL_BASED_OUTPATIENT_CLINIC_OR_DEPARTMENT_OTHER): Payer: Self-pay | Admitting: Pharmacist

## 2013-07-07 ENCOUNTER — Ambulatory Visit: Payer: Self-pay

## 2013-07-07 DIAGNOSIS — D6859 Other primary thrombophilia: Secondary | ICD-10-CM

## 2013-07-07 DIAGNOSIS — I2699 Other pulmonary embolism without acute cor pulmonale: Secondary | ICD-10-CM

## 2013-07-07 DIAGNOSIS — I82401 Acute embolism and thrombosis of unspecified deep veins of right lower extremity: Secondary | ICD-10-CM

## 2013-07-07 DIAGNOSIS — I82409 Acute embolism and thrombosis of unspecified deep veins of unspecified lower extremity: Secondary | ICD-10-CM

## 2013-07-07 LAB — PROTIME-INR
INR: 2.6 (ref 2.00–3.50)
Protime: 31.2 Seconds — ABNORMAL HIGH (ref 10.6–13.4)

## 2013-07-07 NOTE — Progress Notes (Signed)
INR at goal No complaints regarding anticoagulation. 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 He has been busy with work and will continue to remain busy as the holiday season approaches. He is stable on his current dose. Plan to Continue Coumadin 2.5 mg on MWF and 5 mg other days.   Return to clinic in ~ 1 month on 08/15/13 lab at 8 am and Coumadin Clinic at 8:15 am.

## 2013-07-07 NOTE — Patient Instructions (Signed)
INR at goal No changes Continue Coumadin 2.5 mg on MWF and 5 mg other days.   Return to clinic on 08/15/13 lab at 8 am and Coumadin Clinic at 8:15 am.

## 2013-07-10 ENCOUNTER — Other Ambulatory Visit: Payer: Self-pay | Admitting: Internal Medicine

## 2013-07-24 ENCOUNTER — Other Ambulatory Visit: Payer: Self-pay

## 2013-07-24 ENCOUNTER — Telehealth: Payer: Self-pay | Admitting: Pharmacist

## 2013-07-24 NOTE — Telephone Encounter (Signed)
Mr. Antonio [name] called this morning stated that he plans to have a tooth extraction in the near future His dentist has told him that he does not need to stop coumadin as long as his INR is < 3.5 on day of procedure Mr. Jachim will call us back when his procedure date is known and scheduled At this time we will schedule to check his INR the morning of his procedure to assure his INR is < 3.5   Christell Faith, PharmD

## 2013-07-28 ENCOUNTER — Ambulatory Visit (INDEPENDENT_AMBULATORY_CARE_PROVIDER_SITE_OTHER): Payer: BC Managed Care – PPO

## 2013-07-28 DIAGNOSIS — Z23 Encounter for immunization: Secondary | ICD-10-CM

## 2013-07-31 ENCOUNTER — Ambulatory Visit (HOSPITAL_BASED_OUTPATIENT_CLINIC_OR_DEPARTMENT_OTHER): Payer: Self-pay | Admitting: Pharmacist

## 2013-07-31 ENCOUNTER — Other Ambulatory Visit (HOSPITAL_BASED_OUTPATIENT_CLINIC_OR_DEPARTMENT_OTHER): Payer: BC Managed Care – PPO | Admitting: Lab

## 2013-07-31 DIAGNOSIS — I2699 Other pulmonary embolism without acute cor pulmonale: Secondary | ICD-10-CM

## 2013-07-31 DIAGNOSIS — I82401 Acute embolism and thrombosis of unspecified deep veins of right lower extremity: Secondary | ICD-10-CM

## 2013-07-31 DIAGNOSIS — I82409 Acute embolism and thrombosis of unspecified deep veins of unspecified lower extremity: Secondary | ICD-10-CM

## 2013-07-31 DIAGNOSIS — D6859 Other primary thrombophilia: Secondary | ICD-10-CM

## 2013-07-31 LAB — PROTIME-INR
INR: 3.1 (ref 2.00–3.50)
Protime: 37.2 Seconds — ABNORMAL HIGH (ref 10.6–13.4)

## 2013-07-31 NOTE — Progress Notes (Signed)
Pt seen today for an early coumadin visit (pt was scheduled for the end of November but his schedule changed) INR at goal and patient is doing well No complications regarding anticoagulation No medication or diet changes No missed or extra doses Pt will be busy with work over the holidays Plan: Continue Coumadin 2.5 mg on MWF and 5 mg other days.   Return to clinic in ~6 weeks on 09/15/13 lab at 8 am and Coumadin Clinic at 8:15 am.

## 2013-07-31 NOTE — Patient Instructions (Signed)
INR at goal No changes Continue Coumadin 2.5 mg on MWF and 5 mg other days.   Return to clinic on 09/15/13 lab at 8 am and Coumadin Clinic at 8:15 am.

## 2013-08-05 ENCOUNTER — Encounter: Payer: Self-pay | Admitting: Physician Assistant

## 2013-08-05 DIAGNOSIS — I2699 Other pulmonary embolism without acute cor pulmonale: Secondary | ICD-10-CM | POA: Insufficient documentation

## 2013-08-15 ENCOUNTER — Other Ambulatory Visit: Payer: Self-pay | Admitting: Lab

## 2013-08-15 ENCOUNTER — Ambulatory Visit: Payer: Self-pay

## 2013-08-21 ENCOUNTER — Telehealth: Payer: Self-pay | Admitting: Pharmacist

## 2013-08-21 NOTE — Telephone Encounter (Signed)
Pt called coumadin clinic this morning to let us know he is having a tooth pulled next week on Tuesday 08/26/13. His dentist is not worried about his coumadin and states Mr. Mirabal does not need to stop his coumadin prior to the procedure. He would like to have Mr. Cregg INR checked the morning of the procedure to verify INR < 3. Message sent to scheduling to set patient up for 08/26/13 for lab @ 10am and CC @ 10:15am. Pt will also keep his lab/CC appointments for 09/15/13  Christell Faith, PharmD

## 2013-08-26 ENCOUNTER — Ambulatory Visit (HOSPITAL_BASED_OUTPATIENT_CLINIC_OR_DEPARTMENT_OTHER): Payer: Self-pay | Admitting: Pharmacist

## 2013-08-26 ENCOUNTER — Other Ambulatory Visit (HOSPITAL_BASED_OUTPATIENT_CLINIC_OR_DEPARTMENT_OTHER): Payer: BC Managed Care – PPO | Admitting: Lab

## 2013-08-26 DIAGNOSIS — I82409 Acute embolism and thrombosis of unspecified deep veins of unspecified lower extremity: Secondary | ICD-10-CM

## 2013-08-26 DIAGNOSIS — I2699 Other pulmonary embolism without acute cor pulmonale: Secondary | ICD-10-CM

## 2013-08-26 DIAGNOSIS — D6859 Other primary thrombophilia: Secondary | ICD-10-CM

## 2013-08-26 DIAGNOSIS — I82401 Acute embolism and thrombosis of unspecified deep veins of right lower extremity: Secondary | ICD-10-CM

## 2013-08-26 LAB — PROTIME-INR

## 2013-08-26 NOTE — Progress Notes (Signed)
INR above 2 today at 2.2 but slightly less than goal of 2.5-3.5 I feel comfortable with this in light of patient's dental procedure this afternoon. Pt is doing well but is anxious for his procedure Again his dentist does not feel it necessary for him to be off his coumadin for the tooth extraction but I think it is good that Antonio [name] INR is closer to 2 this morning Pt reports no unusual bruising or bleeding No new medication or diet changes Plan: Continue Coumadin 2.5 mg on MWF and 5 mg other days. Pt can take coumadin tonight after his procedure as long as no complications/bleeding arises    Return to clinic on 09/15/13 lab at 8 am and Coumadin Clinic at 8:15 am (appointments scheduled already) Mr. Braxton knows to call us with any concerns or if there are any complications with his procedure

## 2013-08-26 NOTE — Patient Instructions (Signed)
INR at goal Good luck today at your procedure Continue Coumadin 2.5 mg on MWF and 5 mg other days.   Return to clinic on 09/15/13 lab at 8 am and Coumadin Clinic at 8:15 am.

## 2013-08-27 ENCOUNTER — Institutional Professional Consult (permissible substitution): Payer: Self-pay | Admitting: Emergency Medicine

## 2013-09-05 ENCOUNTER — Other Ambulatory Visit: Payer: Self-pay | Admitting: Lab

## 2013-09-05 ENCOUNTER — Ambulatory Visit: Payer: Self-pay

## 2013-09-07 ENCOUNTER — Encounter (HOSPITAL_COMMUNITY): Payer: Self-pay | Admitting: Emergency Medicine

## 2013-09-07 ENCOUNTER — Emergency Department (HOSPITAL_COMMUNITY)
Admission: EM | Admit: 2013-09-07 | Discharge: 2013-09-07 | Disposition: A | Discharge status: Home / Self Care | Payer: BC Managed Care – PPO | Attending: Emergency Medicine | Admitting: Emergency Medicine

## 2013-09-07 ENCOUNTER — Emergency Department (HOSPITAL_COMMUNITY): Payer: BC Managed Care – PPO

## 2013-09-07 DIAGNOSIS — Z7901 Long term (current) use of anticoagulants: Secondary | ICD-10-CM | POA: Insufficient documentation

## 2013-09-07 DIAGNOSIS — Q984 Klinefelter syndrome, unspecified: Secondary | ICD-10-CM | POA: Insufficient documentation

## 2013-09-07 DIAGNOSIS — Z862 Personal history of diseases of the blood and blood-forming organs and certain disorders involving the immune mechanism: Secondary | ICD-10-CM | POA: Insufficient documentation

## 2013-09-07 DIAGNOSIS — Z8639 Personal history of other endocrine, nutritional and metabolic disease: Secondary | ICD-10-CM | POA: Insufficient documentation

## 2013-09-07 DIAGNOSIS — IMO0002 Reserved for concepts with insufficient information to code with codable children: Secondary | ICD-10-CM | POA: Insufficient documentation

## 2013-09-07 DIAGNOSIS — W230XXA Caught, crushed, jammed, or pinched between moving objects, initial encounter: Secondary | ICD-10-CM | POA: Insufficient documentation

## 2013-09-07 DIAGNOSIS — Y929 Unspecified place or not applicable: Secondary | ICD-10-CM | POA: Insufficient documentation

## 2013-09-07 DIAGNOSIS — Z8739 Personal history of other diseases of the musculoskeletal system and connective tissue: Secondary | ICD-10-CM | POA: Insufficient documentation

## 2013-09-07 DIAGNOSIS — S97109A Crushing injury of unspecified toe(s), initial encounter: Secondary | ICD-10-CM | POA: Insufficient documentation

## 2013-09-07 DIAGNOSIS — J45909 Unspecified asthma, uncomplicated: Secondary | ICD-10-CM | POA: Insufficient documentation

## 2013-09-07 DIAGNOSIS — Z79899 Other long term (current) drug therapy: Secondary | ICD-10-CM | POA: Insufficient documentation

## 2013-09-07 DIAGNOSIS — Z8669 Personal history of other diseases of the nervous system and sense organs: Secondary | ICD-10-CM | POA: Insufficient documentation

## 2013-09-07 DIAGNOSIS — Y9389 Activity, other specified: Secondary | ICD-10-CM | POA: Insufficient documentation

## 2013-09-07 DIAGNOSIS — K219 Gastro-esophageal reflux disease without esophagitis: Secondary | ICD-10-CM | POA: Insufficient documentation

## 2013-09-07 NOTE — ED Notes (Signed)
Pt states a tree trunk fell on his right small toe.

## 2013-09-08 NOTE — ED Provider Notes (Signed)
CSN: [id number]     Arrival date & time 09/07/13  1841 History   First MD Initiated Contact with Patient 09/07/13 1853     Chief Complaint  Patient presents with  . Foot Pain   (Consider location/radiation/quality/duration/timing/severity/associated sxs/prior Treatment) HPI Comments: Antonio [name] is a 57 y.o. Male presenting with pain, bruising and swelling of his right 5th toe after sustaining a crush injury several days ago.  He describes cutting down a small tree which "hopped" off the stump landing on his toe briefly before falling.  He has persistent pain and swelling and is concerned for possible fracture.  He is also on coumadin,  Last INR check was 2.9 last week, but is concerned about the bruising.  He denies other unexplained bruising or bleeding.  He has taken no medicines for treatment of pain, has been resting and using ice.       The history is provided by the patient.    Past Medical History  Diagnosis Date  . GERD (gastroesophageal reflux disease)   . Asthma   . Klinefelter syndrome   . Hyperlipidemia   . Chronic headaches   . Eosinophilic esophagitis   . Seasonal allergies   . Cervical spine degeneration   . Cataract    Past Surgical History  Procedure Laterality Date  . Cholecystectomy      ERCP for cbd stones  . Tonsillectomy    . Cataract surgery      bilateral  . Hand tendon surgery      left, work comp  . Ercp    . Colonoscopy    . Upper gastrointestinal endoscopy     Family History  Problem Relation Age of Onset  . Colon cancer Neg Hx   . Irritable bowel syndrome Sister   . Allergies Sister   . Allergies Mother    History  Substance Use Topics  . Smoking status: Passive Smoke Exposure - Never Smoker    Types: Cigarettes  . Smokeless tobacco: Never Used  . Alcohol Use: No    Review of Systems  Constitutional: Negative for fever.  Musculoskeletal: Positive for arthralgias. Negative for myalgias.  Skin: Positive for color change.   Neurological: Negative for weakness and numbness.    Allergies  Codeine  Home Medications   Current Outpatient Rx  Name  Route  Sig  Dispense  Refill  . albuterol (PROAIR HFA) 108 (90 BASE) MCG/ACT inhaler   Inhalation   Inhale 2 puffs into the lungs every 6 (six) hours as needed for wheezing or shortness of breath.         . fluticasone (FLONASE) 50 MCG/ACT nasal spray   Each Nare   Place 2 sprays into both nostrils daily as needed for allergies.         Marland Kitchen loratadine (CLARITIN) 10 MG tablet   Oral   Take 10 mg by mouth daily.         . montelukast (SINGULAIR) 10 MG tablet   Oral   Take 10 mg by mouth at bedtime.         . Multiple Vitamins-Minerals (CENTRUM SILVER PO)   Oral   Take 1 tablet by mouth daily.         . pantoprazole (PROTONIX) 40 MG tablet   Oral   Take 40 mg by mouth daily.         . Testosterone (ANDROGEL) 20.25 MG/1.25GM (1.62%) GEL   Transdermal   Place 1.25 g onto the skin daily.  75 g   0   . warfarin (COUMADIN) 5 MG tablet   Oral   Take 5 mg by mouth daily.          BP 117/61  Pulse 96  Temp(Src) 98.3 F (36.8 C)  Resp 20  Ht 5\' 9"  (1.753 m)  Wt 175 lb (79.379 kg)  BMI 25.83 kg/m2  SpO2 97% Physical Exam  Constitutional: He appears well-developed and well-nourished.  HENT:  Head: Atraumatic.  Neck: Normal range of motion.  Cardiovascular:  Pulses equal bilaterally  Musculoskeletal: He exhibits tenderness.       Right foot: He exhibits decreased range of motion, bony tenderness and swelling. He exhibits normal capillary refill and no deformity.  Moderate edema and ecchymosis limited to the right 5th toe.  Neurological: He is alert. He has normal strength. He displays normal reflexes. No sensory deficit.  Equal strength  Skin: Skin is warm and dry.  Psychiatric: He has a normal mood and affect.    ED Course  Procedures (including critical care time) Labs Review Labs Reviewed - No data to display Imaging  Review Dg Foot Complete Right  09/07/2013   CLINICAL DATA:  Small toe pain.  Trauma.  EXAM: RIGHT FOOT COMPLETE - 3+ VIEW  COMPARISON:  None.  FINDINGS: Anatomic alignment. There is no fracture. The 5th metatarsal and phalanges of the small toe appear normal. Soft tissues appear within normal limits.  IMPRESSION: Negative.   Electronically Signed   By: Andreas Newport M.D.   On: 09/07/2013 19:30    EKG Interpretation   None       MDM   1. Crush injury, toe, initial encounter    Patients labs and/or radiological studies were viewed and considered during the medical decision making and disposition process. Pt advised elevation,  Ice,  Expect gradual improvement.  The toe was buddy taped for comfort and support.  He works as Air traffic controller,  Defers on post op Occupational psychologist - preferring his work boots for support. Advised f/u with pcp if not improving over the next 7-10 days, although advised bruising will probably take longer to resolve.    Burgess Amor, PA-C 09/08/13 1354

## 2013-09-12 ENCOUNTER — Other Ambulatory Visit (HOSPITAL_BASED_OUTPATIENT_CLINIC_OR_DEPARTMENT_OTHER): Payer: BC Managed Care – PPO

## 2013-09-12 ENCOUNTER — Ambulatory Visit: Payer: BC Managed Care – PPO | Admitting: Pharmacist

## 2013-09-12 DIAGNOSIS — I82409 Acute embolism and thrombosis of unspecified deep veins of unspecified lower extremity: Secondary | ICD-10-CM

## 2013-09-12 DIAGNOSIS — I2699 Other pulmonary embolism without acute cor pulmonale: Secondary | ICD-10-CM

## 2013-09-12 DIAGNOSIS — D6859 Other primary thrombophilia: Secondary | ICD-10-CM

## 2013-09-12 DIAGNOSIS — I82401 Acute embolism and thrombosis of unspecified deep veins of right lower extremity: Secondary | ICD-10-CM

## 2013-09-12 LAB — PROTIME-INR: INR: 2.5 (ref 2.00–3.50)

## 2013-09-12 NOTE — Addendum Note (Signed)
Addended by: Neita Goodnight on: 09/12/2013 03:06 PM   Modules accepted: Orders

## 2013-09-12 NOTE — Progress Notes (Addendum)
INR within goal today. No changes in diet. Pt using Salonpas 6.3% patch about every 2 days. It contains methy salicylate. No missed doses. Pt injured his right, baby toe while cutting down a tree.  The tree fell on his toe. Toe is swollen. Some light bruising. Pt states it is getting better. He applied ice as instructed. Acetaminophen PRN for pain. He has f/u in ~ 10days to see if it is fractured. No other problems to report regarding anticoagulation.  No s/s of clotting. Pt stated his work route is changing to [place]. He questioned if he could have his labs drawn at [facility]. We discussed yes this would be fine. A pharmacist would then call him later to discuss results, medication changes, etc. He will let us know when he would like labs arranged at [facility].  Pt also discussed he was seen by an pulmonologist in [place] (as another opinion) and may consider a switch to Xarelto. Dr. Darnelle Catalan feels pt failed Xarelto. Pt is going to "think about it" for awhile. Continue Coumadin for now. Continue Coumadin 2.5 mg on MWF and 5 mg other days.   Return to clinic [facility]) in 5 weeks on 10/17/13: Lab at 8 am and Coumadin Clinic at 8:15 am.

## 2013-09-12 NOTE — Patient Instructions (Signed)
Continue Coumadin 2.5 mg on MWF and 5 mg other days.   Return to clinic in 5 weeks on 10/17/13: Lab at 8 am and Coumadin Clinic at 8:15 am.

## 2013-09-15 ENCOUNTER — Ambulatory Visit: Payer: Self-pay

## 2013-09-15 ENCOUNTER — Other Ambulatory Visit: Payer: Self-pay

## 2013-09-15 NOTE — ED Provider Notes (Signed)
Medical screening examination/treatment/procedure(s) were performed by non-physician practitioner and as supervising physician I was immediately available for consultation/collaboration.  EKG Interpretation   None         Rivan Siordia W. Valary Manahan, MD 09/15/13 1019 

## 2013-10-08 ENCOUNTER — Telehealth: Payer: Self-pay | Admitting: Pharmacist

## 2013-10-08 NOTE — Telephone Encounter (Signed)
Patient called to cancel 10/17/13 appts.  He will call in 1-2 weeks to reschedule. He has changed jobs, and is unsure what time of day the new employer will give him to time to have labs drawn. 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 He has had no complications of anticoagulation, no diet or medications changes. Plan discussed at last CC appt [facility], CC to call patient with results/instructions. Marlou Porchick Sherwood aware we will be sending him messages to [facility].  Earliest lab time in [place] is 7:45am.

## 2013-10-13 ENCOUNTER — Telehealth: Payer: Self-pay | Admitting: Pharmacist

## 2013-10-17 ENCOUNTER — Ambulatory Visit: Payer: Self-pay

## 2013-10-17 ENCOUNTER — Other Ambulatory Visit: Payer: Self-pay

## 2013-10-20 ENCOUNTER — Other Ambulatory Visit: Payer: Self-pay | Admitting: Lab

## 2013-10-22 ENCOUNTER — Other Ambulatory Visit: Payer: Self-pay | Admitting: *Deleted

## 2013-10-22 ENCOUNTER — Ambulatory Visit: Payer: BC Managed Care – PPO | Admitting: Pharmacist

## 2013-10-22 ENCOUNTER — Other Ambulatory Visit (HOSPITAL_BASED_OUTPATIENT_CLINIC_OR_DEPARTMENT_OTHER): Payer: BC Managed Care – PPO | Admitting: Lab

## 2013-10-22 DIAGNOSIS — I82409 Acute embolism and thrombosis of unspecified deep veins of unspecified lower extremity: Secondary | ICD-10-CM

## 2013-10-22 DIAGNOSIS — D6859 Other primary thrombophilia: Secondary | ICD-10-CM

## 2013-10-22 DIAGNOSIS — I2699 Other pulmonary embolism without acute cor pulmonale: Secondary | ICD-10-CM

## 2013-10-22 DIAGNOSIS — I82401 Acute embolism and thrombosis of unspecified deep veins of right lower extremity: Secondary | ICD-10-CM

## 2013-10-22 LAB — PROTIME-INR (CHCC SATELLITE)
INR: 1.9 — ABNORMAL LOW (ref 2.0–3.5)
Protime: 22.8 Seconds — ABNORMAL HIGH (ref 10.6–13.4)

## 2013-10-22 LAB — POCT INR: INR: 1.9

## 2013-10-22 MED ORDER — WARFARIN SODIUM 5 MG PO TABS: 5.0000 mg | ORAL_TABLET | Freq: Every day | ORAL | Status: DC

## 2013-10-22 NOTE — Progress Notes (Signed)
**  NO CHARGE** Telephone encounter.  INR below goal today.  No changes in medications.  No bleeding or bruising.  Antonio [name] has been stable on current coumadin dose for 6 months.  Instructed Antonio [name] to take 5mg  coumadin today, then resume Coumadin 2.5mg  MWF and 5mg  other days.  I recommended Antonio [name] have INR checked in 2 weeks, but he refused.  [facility].

## 2013-10-24 ENCOUNTER — Other Ambulatory Visit: Payer: Self-pay | Admitting: *Deleted

## 2013-10-24 DIAGNOSIS — I82409 Acute embolism and thrombosis of unspecified deep veins of unspecified lower extremity: Secondary | ICD-10-CM

## 2013-10-24 MED ORDER — WARFARIN SODIUM 5 MG PO TABS: 5.0000 mg | ORAL_TABLET | Freq: Every day | ORAL | Status: DC

## 2013-11-02 ENCOUNTER — Emergency Department (HOSPITAL_COMMUNITY)
Admission: EM | Admit: 2013-11-02 | Discharge: 2013-11-02 | Disposition: A | Discharge status: Home / Self Care | Payer: BC Managed Care – PPO | Attending: Emergency Medicine | Admitting: Emergency Medicine

## 2013-11-02 ENCOUNTER — Encounter (HOSPITAL_COMMUNITY): Payer: Self-pay | Admitting: Emergency Medicine

## 2013-11-02 DIAGNOSIS — T1510XA Foreign body in conjunctival sac, unspecified eye, initial encounter: Secondary | ICD-10-CM | POA: Insufficient documentation

## 2013-11-02 DIAGNOSIS — T1590XA Foreign body on external eye, part unspecified, unspecified eye, initial encounter: Secondary | ICD-10-CM | POA: Insufficient documentation

## 2013-11-02 DIAGNOSIS — Z7901 Long term (current) use of anticoagulants: Secondary | ICD-10-CM | POA: Insufficient documentation

## 2013-11-02 DIAGNOSIS — Z8739 Personal history of other diseases of the musculoskeletal system and connective tissue: Secondary | ICD-10-CM | POA: Insufficient documentation

## 2013-11-02 DIAGNOSIS — S058X9A Other injuries of unspecified eye and orbit, initial encounter: Secondary | ICD-10-CM | POA: Insufficient documentation

## 2013-11-02 DIAGNOSIS — K219 Gastro-esophageal reflux disease without esophagitis: Secondary | ICD-10-CM | POA: Insufficient documentation

## 2013-11-02 DIAGNOSIS — Y929 Unspecified place or not applicable: Secondary | ICD-10-CM | POA: Insufficient documentation

## 2013-11-02 DIAGNOSIS — S0500XA Injury of conjunctiva and corneal abrasion without foreign body, unspecified eye, initial encounter: Secondary | ICD-10-CM

## 2013-11-02 DIAGNOSIS — E785 Hyperlipidemia, unspecified: Secondary | ICD-10-CM | POA: Insufficient documentation

## 2013-11-02 DIAGNOSIS — Y9389 Activity, other specified: Secondary | ICD-10-CM | POA: Insufficient documentation

## 2013-11-02 DIAGNOSIS — Z79899 Other long term (current) drug therapy: Secondary | ICD-10-CM | POA: Insufficient documentation

## 2013-11-02 DIAGNOSIS — G8929 Other chronic pain: Secondary | ICD-10-CM | POA: Insufficient documentation

## 2013-11-02 DIAGNOSIS — J45909 Unspecified asthma, uncomplicated: Secondary | ICD-10-CM | POA: Insufficient documentation

## 2013-11-02 DIAGNOSIS — Z9849 Cataract extraction status, unspecified eye: Secondary | ICD-10-CM | POA: Insufficient documentation

## 2013-11-02 DIAGNOSIS — Z87798 Personal history of other (corrected) congenital malformations: Secondary | ICD-10-CM | POA: Insufficient documentation

## 2013-11-02 MED ORDER — TOBRAMYCIN 0.3 % OP SOLN
1.0000 [drp] | Freq: Once | OPHTHALMIC | Status: AC
Start: 2013-11-02 — End: 2013-11-02
  Administered 2013-11-02: 1 [drp] via OPHTHALMIC
  Filled 2013-11-02: qty 5

## 2013-11-02 MED ORDER — KETOROLAC TROMETHAMINE 0.5 % OP SOLN
1.0000 [drp] | Freq: Once | OPHTHALMIC | Status: AC
  Administered 2013-11-02: 1 [drp] via OPHTHALMIC
  Filled 2013-11-02: qty 5

## 2013-11-02 MED ORDER — TETRACAINE HCL 0.5 % OP SOLN
OPHTHALMIC | Status: AC
  Administered 2013-11-02: 19:00:00
  Filled 2013-11-02: qty 2

## 2013-11-02 NOTE — ED Notes (Signed)
Splitting wood and piece went into left eye. Pt has both eyes closed for comfort at this time. Slight sclera redness noted.

## 2013-11-02 NOTE — Discharge Instructions (Signed)
Corneal Abrasion The cornea is the clear covering at the front and center of the eye. When you look at the colored portion of the eye, you are looking through the cornea. It is a thin tissue made up of layers. The top layer is the most sensitive layer. A corneal abrasion happens if this layer is scratched or an injury causes it to come off.  HOME CARE  You may be given drops or a medicated cream. Use the medicine as told by your doctor.  A pressure patch may be put over the eye. If this is done, follow your doctor's instructions for when to remove the patch. Do not drive or use machines while the eye patch is on. Judging distances is hard to do with a patch on.  See your doctor for a follow-up exam if you are told to do so. It is very important that you keep this appointment. GET HELP IF:   You have pain, are sensitive to light, and have a scratchy feeling in one eye or both eyes.  Your pressure patch keeps getting loose. You can blink your eye under the patch.  You have fluid coming from your eye or the lids stick together in the morning.  You have the same symptoms in the morning that you did with the first abrasion. This could be days, weeks, or months after the first abrasion healed. MAKE SURE YOU:   Understand these instructions.  Will watch your condition.  Will get help right away if you are not doing well or get worse. Document Released: 02/21/2008 Document Revised: 06/25/2013 Document Reviewed: 05/12/2013 [facility] Patient Information 2014 [place], [place].  Eye, Foreign Body A foreign body is an object that should not be there. The object could be near, on, or in the eye. HOME CARE If your doctor prescribes an eye patch:  Keep the eye patch on. Do this until you see your doctor again.  Do not remove the patch to put in medicine unless your doctor tells you.  Retape it as it was before:  When replacing the patch.  If the patch comes loose.  Do not drive or use  machinery.  Only take medicine as told by your doctor. If your doctor does not prescribe an eye patch:  Keep the eye closed as much as possible.  Do not rub the eye.  Wear dark glasses in bright light.  Do not wear contact lenses until the eye feels normal, or as told by your doctor.  Wear protective eye covering, especially when using high speed tools.  Only take medicine as told by your doctor. GET HELP RIGHT AWAY IF:   Your pain gets worse.  Your vision changes.  You have problems with the eye patch.  The injury gets larger.  There is fluid (discharge) coming from the eye.  You get puffiness (swelling) and soreness.  You have an oral temperature above 102 F (38.9 C), not controlled by medicine.  Your baby is older than 3 months with a rectal temperature of 102 F (38.9 C) or higher.  Your baby is 913 months old or younger with a rectal temperature of 100.4 F (38 C) or higher. MAKE SURE YOU:   Understand these instructions.  Will watch your condition.  Will get help right away if you are not doing well or get worse. Document Released: 02/22/2010 Document Revised: 11/27/2011 Document Reviewed: 02/22/2010 [facility] Patient Information 2014 [place], [place].

## 2013-11-04 NOTE — ED Provider Notes (Signed)
CSN: [id number]     Arrival date & time 11/02/13  1759 History   First MD Initiated Contact with Patient 11/02/13 1820     Chief Complaint  Patient presents with  . Eye Pain     (Consider location/radiation/quality/duration/timing/severity/associated sxs/prior Treatment) HPI Comments: Antonio [name] is a 58 y.o. male who presents to the Emergency Department complaining of left eye pain and foreign body sensation to his eye.  He states the symptoms began shortly prior to ED arrival.  He states that he was cutting wood and a piece of debris struck his eye.  He states has tried rinsing his eye without relief.  Pain to the eye is improve with holding his eye close.  He was not wearing safety glasses at the time of the injury.  He denies headaches, dizziness or visual changes.  Patient does report hx of bilateral cataract surgery  Patient is a 58 y.o. male presenting with eye pain.  Eye Pain Pertinent negatives include no chills, fever or headaches.    Past Medical History  Diagnosis Date  . GERD (gastroesophageal reflux disease)   . Asthma   . Klinefelter syndrome   . Hyperlipidemia   . Chronic headaches   . Eosinophilic esophagitis   . Seasonal allergies   . Cervical spine degeneration   . Cataract    Past Surgical History  Procedure Laterality Date  . Cholecystectomy      ERCP for cbd stones  . Tonsillectomy    . Cataract surgery      bilateral  . Hand tendon surgery      left, work comp  . Ercp    . Colonoscopy    . Upper gastrointestinal endoscopy     Family History  Problem Relation Age of Onset  . Colon cancer Neg Hx   . Irritable bowel syndrome Sister   . Allergies Sister   . Allergies Mother    History  Substance Use Topics  . Smoking status: Passive Smoke Exposure - Never Smoker    Types: Cigarettes  . Smokeless tobacco: Never Used  . Alcohol Use: No    Review of Systems  Constitutional: Negative for fever and chills.  Eyes: Positive for photophobia,  pain and redness. Negative for discharge, itching and visual disturbance.  Neurological: Negative for dizziness and headaches.  All other systems reviewed and are negative.      Allergies  Codeine  Home Medications   Current Outpatient Rx  Name  Route  Sig  Dispense  Refill  . albuterol (PROAIR HFA) 108 (90 BASE) MCG/ACT inhaler   Inhalation   Inhale 2 puffs into the lungs every 6 (six) hours as needed for wheezing or shortness of breath.         . Camphor-Menthol-Methyl Sal (SALONPAS) 1.2-5.7-6.3 % PTCH   Apply externally   Apply 1 patch topically as needed (Pain). Pt applies one patch about every 2 days.         Marland Kitchen loratadine (CLARITIN) 10 MG tablet   Oral   Take 10 mg by mouth daily.         . montelukast (SINGULAIR) 10 MG tablet   Oral   Take 10 mg by mouth at bedtime.         . Multiple Vitamins-Minerals (CENTRUM SILVER PO)   Oral   Take 1 tablet by mouth daily.         . pantoprazole (PROTONIX) 40 MG tablet   Oral   Take 40 mg by  mouth daily.         . Testosterone (ANDROGEL) 20.25 MG/1.25GM (1.62%) GEL   Transdermal   Place 1.25 g onto the skin daily.   75 g   0   . warfarin (COUMADIN) 5 MG tablet   Oral   Take 1 tablet (5 mg total) by mouth daily.   30 tablet   6    BP 125/56  Pulse 98  Temp(Src) 98.2 F (36.8 C) (Oral)  Resp 20  Ht 5\' 9"  (1.753 m)  Wt 172 lb (78.019 kg)  BMI 25.39 kg/m2  SpO2 99% Physical Exam  Nursing note and vitals reviewed. Constitutional: He is oriented to person, place, and time. He appears well-developed and well-nourished. No distress.  HENT:  Head: Normocephalic and atraumatic.  Eyes: Conjunctivae and EOM are normal. Pupils are equal, round, and reactive to light. Left eye exhibits no chemosis, no discharge and no exudate. Foreign body present in the left eye.  Slit lamp exam:      The left eye shows corneal abrasion and fluorescein uptake. The left eye shows no corneal flare, no corneal ulcer, no  hyphema, no hypopyon and no anterior chamber bulge.    Left upper eyelid was everted and FB was seen.  Removed completely using a cotton swab prior to slit lamp exam.  Small corneal abrasion present. No remaining FB's seen, negative Seidel's sign  Neck: Normal range of motion. Neck supple.  Cardiovascular: Normal rate, regular rhythm, normal heart sounds and intact distal pulses.   No murmur heard. Pulmonary/Chest: Effort normal and breath sounds normal. No respiratory distress.  Neurological: He is alert and oriented to person, place, and time.  Skin: Skin is warm and dry.    ED Course  FOREIGN BODY REMOVAL Date/Time: 11/03/2013 6:30 PM Performed by: Trisha Mangle, [place] Authorized by: Maxwell Caul Consent: Verbal consent obtained. written consent not obtained. Consent given by: patient Patient understanding: patient states understanding of the procedure being performed Patient identity confirmed: verbally with patient and arm band Body area: eye Location details: left eyelid Local anesthetic: tetracaine drops Anesthetic total: 5 drops Patient sedated: no Patient restrained: no Patient cooperative: yes Localization method: eyelid eversion and visualized Removal mechanism: moist cotton swab Eye examined with fluorescein. Fluorescein uptake. Corneal abrasion size: small Corneal abrasion location: medial No residual rust ring present. Complexity: simple 1 objects recovered. Objects recovered: wood sliver Post-procedure assessment: foreign body removed Patient tolerance: Patient tolerated the procedure well with no immediate complications.   (including critical care time) Labs Review Labs Reviewed - No data to display Imaging Review No results found.  EKG Interpretation   None       MDM   Final diagnoses:  Foreign body, eye  Corneal abrasion    Visual Acuity - Bilateral Distance: 20/25 with glasses ; R Distance: 20/25 ; L Distance: 20/25   Patient with  obvious FB under left upper eyelid that was easily removed.  Small corneal abrasion present at the 8:00 position at the border of the iris.  Left eye was then flushed with saline.  Pain improved.  Patient dispensed tobramycin drops and ketorolac drops with instructions for use.  He also agrees to f/u with his ophthalmologist for recheck if needed.    The patient appears reasonably screened and/or stabilized for discharge and I doubt any other medical condition or other [facility] requiring further screening, evaluation, or treatment in the ED at this time prior to discharge.     Aarian Griffie L. Berlin Mokry,  PA-C 11/04/13 0159

## 2013-11-05 NOTE — ED Provider Notes (Signed)
Medical screening examination/treatment/procedure(s) were performed by non-physician practitioner and as supervising physician I was immediately available for consultation/collaboration.  Solei Wubben, MD 11/05/13 0711 

## 2013-11-11 ENCOUNTER — Telehealth: Payer: Self-pay | Admitting: Hematology & Oncology

## 2013-11-11 NOTE — Telephone Encounter (Signed)
Pt called to cancel/reschedule

## 2013-11-12 ENCOUNTER — Other Ambulatory Visit: Payer: Self-pay | Admitting: Lab

## 2013-11-20 ENCOUNTER — Ambulatory Visit (INDEPENDENT_AMBULATORY_CARE_PROVIDER_SITE_OTHER): Payer: Self-pay | Admitting: Pharmacist

## 2013-11-20 ENCOUNTER — Other Ambulatory Visit (HOSPITAL_BASED_OUTPATIENT_CLINIC_OR_DEPARTMENT_OTHER): Payer: BC Managed Care – PPO | Admitting: Lab

## 2013-11-20 DIAGNOSIS — I2699 Other pulmonary embolism without acute cor pulmonale: Secondary | ICD-10-CM

## 2013-11-20 DIAGNOSIS — D6859 Other primary thrombophilia: Secondary | ICD-10-CM

## 2013-11-20 DIAGNOSIS — I82409 Acute embolism and thrombosis of unspecified deep veins of unspecified lower extremity: Secondary | ICD-10-CM

## 2013-11-20 DIAGNOSIS — I82401 Acute embolism and thrombosis of unspecified deep veins of right lower extremity: Secondary | ICD-10-CM

## 2013-11-20 LAB — PROTIME-INR (CHCC SATELLITE)
INR: 2.2 (ref 2.0–3.5)
PROTIME: 26.4 s — AB (ref 10.6–13.4)

## 2013-11-20 LAB — POCT INR: INR: 2.2

## 2013-11-20 NOTE — Progress Notes (Signed)
INR = 2.2   Goal 2.5-3.5 Patient is subtherapeutic today.  No medication/dietary changes. He states that he is under an extreme amount of stress at present (job issues, sick pet, etc.) He has occassional mild nose bleeds, this is not new for him. 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 He will increase his Coumadin dose slightly to 5 mg daily except 2.5 mg Monday and Friday. He is unable to make an appointment at this time.  He will call us in 2-3 weeks and make an appointment for another INR. He will us in the meantime if bleeding/bruising or signs of clotting (redness, swelling, etc).  NO CHARGE - Encounter was telephone only  Cletis AthensLisa Veria Stradley, PharmD

## 2013-12-03 ENCOUNTER — Telehealth: Payer: Self-pay | Admitting: Pharmacist

## 2013-12-03 NOTE — Telephone Encounter (Signed)
Spoke with patient regarding scheduling appointment. When I spoke with him 2 weeks ago, he planned to schedule an appt in 2-3 weeks. He has not yet scheduled an appt. 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 He has an appt with Dr. Darnelle CatalanMagrinat on 12/18/13 and feels that this is the next time he will be able to come in. He is aware that his INR is subtherapeutic and we have requested that he come in prior to 4/2. If he able, he will make an appt prior to that.   He states that he does not like having labs drawn at the [place] location, his finger is sore for 2-3 days following those appts.  Cletis AthensLisa Lashonna Rieke, PharmD

## 2013-12-09 ENCOUNTER — Telehealth: Payer: Self-pay | Admitting: Oncology

## 2013-12-09 ENCOUNTER — Telehealth: Payer: Self-pay | Admitting: Physician Assistant

## 2013-12-09 NOTE — Telephone Encounter (Signed)
, °

## 2013-12-10 ENCOUNTER — Telehealth: Payer: Self-pay | Admitting: Hematology & Oncology

## 2013-12-10 ENCOUNTER — Telehealth: Payer: Self-pay | Admitting: Pharmacist

## 2013-12-10 NOTE — Telephone Encounter (Signed)
Pt made 3-27 lab appointment, [name] aware

## 2013-12-12 ENCOUNTER — Other Ambulatory Visit (HOSPITAL_BASED_OUTPATIENT_CLINIC_OR_DEPARTMENT_OTHER): Payer: BC Managed Care – PPO | Admitting: Lab

## 2013-12-12 ENCOUNTER — Ambulatory Visit (INDEPENDENT_AMBULATORY_CARE_PROVIDER_SITE_OTHER): Payer: Self-pay | Admitting: Pharmacist

## 2013-12-12 DIAGNOSIS — I2699 Other pulmonary embolism without acute cor pulmonale: Secondary | ICD-10-CM

## 2013-12-12 DIAGNOSIS — I82401 Acute embolism and thrombosis of unspecified deep veins of right lower extremity: Secondary | ICD-10-CM

## 2013-12-12 DIAGNOSIS — D6859 Other primary thrombophilia: Secondary | ICD-10-CM

## 2013-12-12 DIAGNOSIS — I82409 Acute embolism and thrombosis of unspecified deep veins of unspecified lower extremity: Secondary | ICD-10-CM

## 2013-12-12 LAB — PROTIME-INR (CHCC SATELLITE)
INR: 2.4 (ref 2.0–3.5)
PROTIME: 28.8 s — AB (ref 10.6–13.4)

## 2013-12-12 LAB — POCT INR: INR: 2.4

## 2013-12-12 NOTE — Progress Notes (Signed)
Spoke with patient via telephone [facility] Pt dose is 5mg  daily with 2.5 mg on Mon and Thur Continue same dose Stress level same but increasing with [employer] approaching RTC on 4/21 with MD appmt No other changes to report other than changing from Claritin to Zyrtec 5mg  No s/s or bleeding or clotting

## 2013-12-12 NOTE — Patient Instructions (Signed)
Coumadin to 5mg  daily, except 2.5 mg on Monday and Thursday. RTC with MD appmt on 4/21.

## 2013-12-18 ENCOUNTER — Other Ambulatory Visit: Payer: Self-pay

## 2013-12-18 ENCOUNTER — Ambulatory Visit: Payer: Self-pay | Admitting: Oncology

## 2013-12-18 ENCOUNTER — Ambulatory Visit: Payer: Self-pay

## 2013-12-19 ENCOUNTER — Other Ambulatory Visit: Payer: Self-pay | Admitting: Lab

## 2014-01-06 ENCOUNTER — Ambulatory Visit (HOSPITAL_BASED_OUTPATIENT_CLINIC_OR_DEPARTMENT_OTHER): Payer: BC Managed Care – PPO | Admitting: Pharmacist

## 2014-01-06 ENCOUNTER — Ambulatory Visit (HOSPITAL_BASED_OUTPATIENT_CLINIC_OR_DEPARTMENT_OTHER): Payer: BC Managed Care – PPO | Admitting: Physician Assistant

## 2014-01-06 ENCOUNTER — Encounter: Payer: Self-pay | Admitting: Physician Assistant

## 2014-01-06 ENCOUNTER — Other Ambulatory Visit: Payer: BC Managed Care – PPO

## 2014-01-06 ENCOUNTER — Telehealth: Payer: Self-pay | Admitting: Oncology

## 2014-01-06 VITALS — BP 122/66 | HR 77 | Temp 98.5°F | Resp 18 | Ht 69.0 in | Wt 166.9 lb

## 2014-01-06 DIAGNOSIS — I2699 Other pulmonary embolism without acute cor pulmonale: Secondary | ICD-10-CM

## 2014-01-06 DIAGNOSIS — I82409 Acute embolism and thrombosis of unspecified deep veins of unspecified lower extremity: Secondary | ICD-10-CM

## 2014-01-06 DIAGNOSIS — I82401 Acute embolism and thrombosis of unspecified deep veins of right lower extremity: Secondary | ICD-10-CM

## 2014-01-06 DIAGNOSIS — D6859 Other primary thrombophilia: Secondary | ICD-10-CM

## 2014-01-06 LAB — PROTIME-INR
INR: 2.6 (ref 2.00–3.50)
Protime: 31.2 Seconds — ABNORMAL HIGH (ref 10.6–13.4)

## 2014-01-06 LAB — POCT INR: INR: 2.6

## 2014-01-06 NOTE — Progress Notes (Addendum)
INR within goal today. INR goal = 2.5-3.5. Pt doing well. No changes in diet or medications. No s/s of clotting noted. No problems or concerns regarding anticoagulation. Stress at work has continued likely to decrease soon per patient report. Continue Coumadin 5mg  daily except 2.5mg  on Monday and Thursday.  [facility]. Pt [facility] from the lab [facility] These lancets are less painful for him. Will order CBC with next visit. Pt to see Zollie Scalemy Berry today.

## 2014-01-06 NOTE — Progress Notes (Signed)
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 He was diagnosed in [id number] at [facility] and was started on testosterone supplementation then. More recently his testosterone level was found to be somewhat lower than expected and his dose was increased (April 2014). 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    A few weeks later, on 02/08/2013 he presented to Urgent Care with swelling in his Right leg and Dopplers showed acute deep vein thrombosis involving posterior tibial and peroneal veins, coursing through to the popliteal and extending to mid femoral veins of the right lower extremity. He was started on xarelto/ rivaroxaban, and purchsed 15 tablets initially for $142, then 27 tablets for $250 (he used a company coupon so actually only paid $5).  Repeat dopplers 02/14/2013 showed acute to subacute deep vein thrombosis involving the right femoral, popliteal, proximal posterior tibial, and proximal peroneal veins. This was felt to be at least stable. However this morning (02/19/2013) the patient developed some strange crawling feeling in his Right leg folowed by chest pressure. He presented to the ED and CT-angio was positive for pulmonary embolus with evidence of both acute and chronic thromboembolic disease in the lungs bilaterally. All filling defects in the pulmonary   arteries appeared to be nonocclusive. There was no evidence of pulmonary hemorrhage to suggest a frank pulmonary  infarction.  Antonio [name] was admitted for IV heparinization and we were consulted for further management  Subsequent history and treatments are as detailed below.       INTERVAL HISTORY: Antonio [name] returns today for followup of his coagulopathy. He continues on coumadinization, and is followed closely through our Coumadin Clinic.  Currently, his INR is therapeutic at 2.6. He continues on a dose of 5 mg daily, with the exception of Monday and Thursday when he takes 2.5 mg.  Antonio [name] denies any abnormal bleeding or increased bruising. He continues to have swelling in his right lower extremity where his prior DVT was diagnosed. He is wearing a compression stocking under the instruction of his primary care physician, Dr. Merla Richesoolittle. This does seem to help the swelling. He's had no swelling in the left lower extremity. He has a history of asthma with chronic shortness of breath, but feels like this is stable. He has had no pain with deep inspiration, and denies any cough, phlegm production, increased shortness of breath, chest pain or pressure.    REVIEW OF SYSTEMS:  Antonio [name] denies any recent illnesses and has had no fevers or chills. He denies any unexplained fatigue or weight loss. He denies any nausea, emesis, or change in bowel or bladder habits. He's had no abnormal headaches, dizziness, or change in vision. He does have some left knee pain which is being treated following a recent fall. Otherwise, he denies any current myalgias, arthralgias, or bony pain.  A detailed review of systems is otherwise stable and noncontributory.    PAST MEDICAL HISTORY: Past Medical History  Diagnosis Date  . GERD (gastroesophageal reflux  disease)   . Asthma   . Klinefelter syndrome   . Hyperlipidemia   . Chronic headaches   . Eosinophilic esophagitis   . Seasonal allergies   . Cervical spine degeneration   . Cataract     PAST SURGICAL HISTORY: Past Surgical History  Procedure Laterality Date  . Cholecystectomy       ERCP for cbd stones  . Tonsillectomy    . Cataract surgery      bilateral  . Hand tendon surgery      left, work comp  . Ercp    . Colonoscopy    . Upper gastrointestinal endoscopy      FAMILY HISTORY Family History  Problem Relation Age of Onset  . Colon cancer Neg Hx   . Irritable bowel syndrome Sister   . Allergies Sister   . Allergies Mother   The patient's father drowned when the patient was 89 years old (rip tide at [facility]). The patient's mother is currently 42. The patient has one sister. 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 His wife [name] used to work at [employer] but is now retired, with significant COPD problems. She has 3 sons from a prior marriage ("a Information systems manager, a Corporate investment banker, and an optometrist"). They have 4 biological and 2 step grandchildren. The patient is not a church attender                          ADVANCED DIRECTIVES: in place   HEALTH MAINTENANCE:  (Updated 04/21/215) History  Substance Use Topics  . Smoking status: Passive Smoke Exposure - Never Smoker    Types: Cigarettes  . Smokeless tobacco: Never Used  . Alcohol Use: No               Colonoscopy: 2008/Gessner             PSA: April 2014, 0.52/McPherson             Lipid panel: April 2014/McPherson   Allergies  Allergen Reactions  . Codeine Other (See Comments)    REACTION: difficulty breathing, nausea    Current Outpatient Prescriptions  Medication Sig Dispense Refill  . albuterol (PROAIR HFA) 108 (90 BASE) MCG/ACT inhaler Inhale 2 puffs into the lungs every 6 (six) hours as needed for wheezing or shortness of breath.      . Camphor-Menthol-Methyl Sal (SALONPAS) 1.2-5.7-6.3 % PTCH Apply 1 patch topically as needed  (Pain). Pt applies one patch about every 2 days.      . montelukast (SINGULAIR) 10 MG tablet Take 10 mg by mouth at bedtime.      . Multiple Vitamins-Minerals (CENTRUM SILVER PO) Take 1 tablet by mouth daily.      . pantoprazole (PROTONIX) 40 MG tablet Take 40 mg by mouth daily.      . Testosterone (ANDROGEL) 20.25 MG/1.25GM (1.62%) GEL Place 1.25 g onto the skin daily.  75 g  0  . warfarin (COUMADIN) 5 MG tablet Take 1 tablet (5 mg total) by mouth daily.  30 tablet  6   No current facility-administered medications for this visit.    OBJECTIVE:  Middle aged white male who appears comfortable and is in no acute distress Filed Vitals:  01/06/14 1528  BP: 122/66  Pulse: 77  Temp: 98.5 F (36.9 C)  Resp: 18     Body mass index is 24.64 kg/(m^2).    ECOG FS: 1 Filed Weights   01/06/14 1528  Weight: 166 lb 14.4 oz (75.705 kg)   Physical Exam: HEENT:  Sclerae anicteric.  Oropharynx clear and moist. Neck supple.  NODES:  No cervical, supraclavicular, or axillary lymphadenopathy palpated.  LUNGS:  Clear to auscultation bilaterally with good excursion.  No wheezes or rhonchi HEART:  Regular rate and rhythm. No murmur  ABDOMEN:  Soft, nontender.  Positive bowel sounds.  MSK:  No focal spinal tenderness to palpation.  EXTREMITIES:  No peripheral edema.   SKIN:  Benign with no visible rashes.  No excessive ecchymoses. No petechiae. No pallor. NEURO:  Nonfocal. Well oriented.  Appropriate affect.     LAB RESULTS:    Recent Labs Lab 01/06/14 1457  INR 2.60    STUDIES: No results found.   ASSESSMENT: 58 [place]   (1) Klinefelter's syndrome, diagnosed at [facility], on lifelong testosterone replacement, with dose increased April 2014 (2) factor V Leyden mutation documented April 2014 (3) Right lower extremity DVT documented 02/08/2013, started on rivaroxaban/ Xarelto same day, with acute and chronic pulmonary emboli documented 02/19/2013 (4) on warfarin as  of 02/21/2013 with INR goal of 2.5 - 3.5   PLAN: Antonio [name] is doing well on his Coumadin and will continue on his current dose, specifically 5 mg daily with 2.5 mg on Mondays and Thursdays. He will continue to be followed closely through the Coumadin Clinic as before.  The question at this point is whether or not to continue anti-coagulation for life, and if so, which medication to use long-term. Dr. Darnelle CatalanMagrinat has asked that we refer Antonio [name] to [facility] Imag[facility]Stephen [facility] for [facility] evaluation, to help us with this complicated decision.   Antonio [name] will then return to see Dr. Darnelle CatalanMagrinat after he has seen Dr. Isaiah SergeMoll and we will discuss long-term followup. At this point, unless it is suggested by Dr. Isaiah SergeMoll, Dr. Darnelle CatalanMagrinat does not plan to repeat any Doppler studies or CT angiograms.  The above was reviewed in detail with walker today. He voiced his understanding and agreement with this plan. He will call with any changes or problems, but will otherwise continue with his current regimen.   Catalina Gravelmy G Danyah Guastella, PA-C   01/06/2014 5:01 PM

## 2014-01-06 NOTE — Telephone Encounter (Signed)
, °

## 2014-01-06 NOTE — Patient Instructions (Signed)
Continue Coumadin 5mg  daily except 2.5mg  on Monday and Thursday.  Reche[facility] at [facility] in 4 - 5 weeks.

## 2014-01-07 ENCOUNTER — Telehealth: Payer: Self-pay | Admitting: Oncology

## 2014-01-07 NOTE — Telephone Encounter (Signed)
Pt appt. To see [facility]Moll at [facility] is 03/10/14@11 :00. Medical records faxed. Pt is aware.

## 2014-01-09 ENCOUNTER — Ambulatory Visit (INDEPENDENT_AMBULATORY_CARE_PROVIDER_SITE_OTHER): Payer: BC Managed Care – PPO | Admitting: Family Medicine

## 2014-01-09 ENCOUNTER — Encounter: Payer: Self-pay | Admitting: Family Medicine

## 2014-01-09 VITALS — BP 104/65 | HR 65 | Temp 97.7°F | Resp 16 | Ht 67.5 in | Wt 169.0 lb

## 2014-01-09 DIAGNOSIS — E291 Testicular hypofunction: Secondary | ICD-10-CM

## 2014-01-09 DIAGNOSIS — Z0000 Encounter for general adult medical examination without abnormal findings: Secondary | ICD-10-CM

## 2014-01-09 LAB — CBC
HCT: 43.3 % (ref 39.0–52.0)
Hemoglobin: 15.6 g/dL (ref 13.0–17.0)
MCH: 30 pg (ref 26.0–34.0)
MCHC: 36 g/dL (ref 30.0–36.0)
MCV: 83.3 fL (ref 78.0–100.0)
Platelets: 192 10*3/uL (ref 150–400)
RBC: 5.2 MIL/uL (ref 4.22–5.81)
RDW: 13.8 % (ref 11.5–15.5)
WBC: 6.5 10*3/uL (ref 4.0–10.5)

## 2014-01-09 LAB — COMPLETE METABOLIC PANEL WITH GFR
ALT: 17 U/L (ref 0–53)
AST: 14 U/L (ref 0–37)
Alkaline Phosphatase: 48 U/L (ref 39–117)
CO2: 27 mEq/L (ref 19–32)
Calcium: 9.1 mg/dL (ref 8.4–10.5)
Chloride: 102 mEq/L (ref 96–112)
Creat: 0.99 mg/dL (ref 0.50–1.35)
Potassium: 3.8 mEq/L (ref 3.5–5.3)
Sodium: 141 mEq/L (ref 135–145)
Total Bilirubin: 1.2 mg/dL (ref 0.2–1.2)
Total Protein: 6.6 g/dL (ref 6.0–8.3)

## 2014-01-09 LAB — COMPLETE METABOLIC PANEL WITHOUT GFR
Albumin: 3.9 g/dL (ref 3.5–5.2)
BUN: 16 mg/dL (ref 6–23)
GFR, Est African American: >89 mL/min
GFR, Est Non African American: 84 mL/min
Glucose, Bld: 69 mg/dL — ABNORMAL LOW (ref 70–99)

## 2014-01-09 LAB — POCT UA - MICROSCOPIC ONLY
Casts, Ur, LPF, POC: NEGATIVE
Crystals, Ur, HPF, POC: NEGATIVE
Epithelial cells, urine per micros: NEGATIVE
Mucus, UA: NEGATIVE
WBC, Ur, HPF, POC: NEGATIVE
Yeast, UA: NEGATIVE

## 2014-01-09 LAB — POCT URINALYSIS DIPSTICK
Bilirubin, UA: NEGATIVE
Blood, UA: NEGATIVE
Glucose, UA: NEGATIVE
Ketones, UA: NEGATIVE
Leukocytes, UA: NEGATIVE
Nitrite, UA: NEGATIVE
Protein, UA: NEGATIVE
Spec Grav, UA: 1.01
Urobilinogen, UA: 0.2
pH, UA: 7

## 2014-01-09 LAB — TSH: TSH: 3.309 u[IU]/mL (ref 0.350–4.500)

## 2014-01-09 LAB — IFOBT (OCCULT BLOOD): IFOBT: NEGATIVE

## 2014-01-09 LAB — LIPID PANEL
Cholesterol: 207 mg/dL — ABNORMAL HIGH (ref 0–200)
HDL: 43 mg/dL (ref >39)
LDL Cholesterol: 129 mg/dL — ABNORMAL HIGH (ref 0–99)
Total CHOL/HDL Ratio: 4.8 Ratio
Triglycerides: 174 mg/dL — ABNORMAL HIGH (ref <150)
VLDL: 35 mg/dL (ref 0–40)

## 2014-01-09 NOTE — Progress Notes (Signed)
Chief Complaint:  Chief Complaint  Patient presents with  . Annual Exam    HPI: Antonio [name] is a [age] y.o. 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 He is followed by Dr Antonio [name] but is [facility] a [facility], he wants to look at Factor 5 Leiden and if he can be off of coumadin,  June 25th is his next appt He is getting  labs regular last INR was 2.6 He has not eaten, No rectal bleeding and no blood in his urine.  Last colonscopy was normal, due for another one in 2018 Dr Antonio [name]  Testosterone injection is replaced with androgel, no sxs currently He has Klinefelter's Syndrome w/ hypogonadism/ low testosterone.   Past Medical History  Diagnosis Date  . GERD (gastroesophageal reflux disease)   . Asthma   . Klinefelter syndrome   . Hyperlipidemia   . Chronic headaches   . Eosinophilic esophagitis   . Seasonal allergies   . Cervical spine degeneration   . Cataract   . Allergy   . Clotting disorder    Past Surgical History  Procedure Laterality Date  . Cholecystectomy      ERCP for cbd stones  . Tonsillectomy    . Cataract surgery      bilateral  . Hand tendon surgery      left, work comp  . Ercp    . Colonoscopy    . Upper gastrointestinal endoscopy     History   Social History  . Marital Status: Married    Spouse Name: Antonio [name]    Number of Children: 0  . 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 Smoking status: Passive Smoke Exposure - Never Smoker    Types: Cigarettes  . Smokeless tobacco: Never Used    . Alcohol Use: No  . Drug Use: No  . Sexual Activity: Yes    Partners: Female   Other Topics Concern  . None   Social History Narrative   Lives with his wife. She has been diagnosed with COPD and continues to smoke.   Family History  Problem Relation Age of Onset  . Colon cancer Neg Hx   . Irritable bowel syndrome Sister   . Allergies Sister   . Allergies Mother    Allergies  Allergen Reactions  . Codeine Other (See Comments)    REACTION: difficulty breathing, nausea   Prior to Admission medications   Medication Sig Start Date End Date Taking? Authorizing Provider  albuterol (PROAIR HFA) 108 (90 BASE) MCG/ACT inhaler Inhale 2 puffs into the lungs every 6 (six) hours as needed for wheezing or shortness of breath.   Yes Historical Provider, MD  montelukast (SINGULAIR) 10 MG tablet Take 10 mg by mouth at bedtime.   Yes Historical Provider, MD  Multiple Vitamins-Minerals (CENTRUM SILVER PO) Take 1 tablet by mouth daily. 02/24/13  Yes Historical Provider, MD  pantoprazole (PROTONIX) 40 MG tablet Take 40 mg by mouth daily.   Yes  Historical Provider, MD  Testosterone (ANDROGEL) 20.25 MG/1.25GM (1.62%) GEL Place 1.25 g onto the skin daily. 05/14/13  Yes Antonio BellingSean Ellison, MD  warfarin (COUMADIN) 5 MG tablet Take 1 tablet (5 mg total) by mouth daily. 10/24/13  Yes Antonio DellGustav C Magrinat, MD     ROS: The patient denies fevers, chills, night sweats, unintentional weight loss, chest pain, palpitations, wheezing, dyspnea on exertion, nausea, vomiting, abdominal pain, dysuria, hematuria, melena, numbness, weakness, or tingling.   All other systems have been reviewed and were otherwise negative with the exception of those mentioned in the HPI and as above.    PHYSICAL EXAM: Filed Vitals:   01/09/14 0813  BP: 104/65  Pulse: 65  Temp: 97.7 F (36.5 C)  Resp: 16   Filed Vitals:   01/09/14 0813  Height: 5' 7.5" (1.715 m)  Weight: 169 lb (76.658 kg)   Body mass index is 26.06 kg/(m^2).  General:  Alert, no acute distress HEENT:  Normocephalic, atraumatic, oropharynx patent. EOMI, PERRLA, fundo nl, tm nl Cardiovascular:  Regular rate and rhythm, no rubs murmurs or gallops.  No Carotid bruits, radial pulse intact. No pedal edema.  Respiratory: Clear to auscultation bilaterally.  No wheezes, rales, or rhonchi.  No cyanosis, no use of accessory musculature GI: No organomegaly, abdomen is soft and non-tender, positive bowel sounds.  No masses. Skin: No rashes. Neurologic: Facial musculature symmetric. Psychiatric: Patient is appropriate throughout our interaction. Lymphatic: No cervical lymphadenopathy Musculoskeletal: Gait intact. 5/5 UE and Antonio [name], 2/2 DTRs GU-nl, circumcised Rectal  Exam prostate nl   LABS: Results for orders placed in visit on 01/06/14  POCT INR      Result Value Ref Range   INR 2.6       EKG/XRAY:   Primary read [name].   ASSESSMENT/PLAN: Encounter Diagnoses  Name Primary?  . Annual physical exam Yes  . Hypogonadism male    Labs pending F/u in 1 year  Gross sideeffects, risk and benefits, and alternatives of medications d/w patient. Patient is aware that all medications have potential sideeffects and we are unable to predict every sideeffect or drug-drug interaction that may occur.  Lenell Antuhao P Antonio Sutcliffe, DO 01/09/2014 8:47 AM

## 2014-01-10 LAB — PSA: PSA: 0.85 ng/mL (ref <=4.00)

## 2014-01-12 LAB — TESTOSTERONE, FREE, TOTAL, SHBG
Sex Hormone Binding: 31 nmol/L (ref 13–71)
Testosterone, Free: 183.4 pg/mL (ref 47.0–244.0)
Testosterone-% Free: 2.4 % (ref 1.6–2.9)
Testosterone: 778 ng/dL (ref 300–890)

## 2014-01-16 ENCOUNTER — Other Ambulatory Visit: Payer: Self-pay | Admitting: Physician Assistant

## 2014-01-16 ENCOUNTER — Telehealth: Payer: Self-pay | Admitting: Family Medicine

## 2014-01-16 NOTE — Telephone Encounter (Signed)
Spoke to patient, inquiring about if he got results through mychart since I was not able to find it in the correspondence section of epic. He states he did but I do not remember what I wrote since I cannot find it in epic. We went through labs again, he would like to try fish oil if we can find him small pills for his high cholesterol. Recommended megared. F/u for recheck lipids

## 2014-01-16 NOTE — Telephone Encounter (Signed)
Dr Conley RollsLe, do you want to RF albuterol for the year for pt? Just saw pt for CPE and f/up plan is 1 year, but don't see albuterol addressed.

## 2014-01-19 ENCOUNTER — Other Ambulatory Visit: Payer: Self-pay | Admitting: *Deleted

## 2014-01-19 DIAGNOSIS — I82409 Acute embolism and thrombosis of unspecified deep veins of unspecified lower extremity: Secondary | ICD-10-CM

## 2014-01-19 MED ORDER — WARFARIN SODIUM 5 MG PO TABS: 5.0000 mg | ORAL_TABLET | Freq: Every day | ORAL | Status: DC

## 2014-02-10 ENCOUNTER — Other Ambulatory Visit (HOSPITAL_BASED_OUTPATIENT_CLINIC_OR_DEPARTMENT_OTHER): Payer: BC Managed Care – PPO | Admitting: Lab

## 2014-02-10 ENCOUNTER — Ambulatory Visit (INDEPENDENT_AMBULATORY_CARE_PROVIDER_SITE_OTHER): Payer: BC Managed Care – PPO | Admitting: Pharmacist

## 2014-02-10 DIAGNOSIS — D6859 Other primary thrombophilia: Secondary | ICD-10-CM

## 2014-02-10 DIAGNOSIS — I82409 Acute embolism and thrombosis of unspecified deep veins of unspecified lower extremity: Secondary | ICD-10-CM

## 2014-02-10 DIAGNOSIS — I2699 Other pulmonary embolism without acute cor pulmonale: Secondary | ICD-10-CM

## 2014-02-10 DIAGNOSIS — I82401 Acute embolism and thrombosis of unspecified deep veins of right lower extremity: Secondary | ICD-10-CM

## 2014-02-10 LAB — PROTIME-INR (CHCC SATELLITE)
INR: 3.7 — ABNORMAL HIGH (ref 2.0–3.5)
Protime: 44.4 Seconds — ABNORMAL HIGH (ref 10.6–13.4)

## 2014-02-10 LAB — CBC WITH DIFFERENTIAL/PLATELET
BASO%: 0.6 % (ref 0.0–2.0)
BASOS ABS: 0 10*3/uL (ref 0.0–0.1)
EOS%: 3.4 % (ref 0.0–7.0)
Eosinophils Absolute: 0.2 10*3/uL (ref 0.0–0.5)
HCT: 44.6 % (ref 38.4–49.9)
HEMOGLOBIN: 15.7 g/dL (ref 13.0–17.1)
LYMPH#: 1.4 10*3/uL (ref 0.9–3.3)
LYMPH%: 29.3 % (ref 14.0–49.0)
MCH: 30.1 pg (ref 27.2–33.4)
MCHC: 35.2 g/dL (ref 32.0–36.0)
MCV: 85.6 fL (ref 79.3–98.0)
MONO#: 0.4 10*3/uL (ref 0.1–0.9)
MONO%: 8 % (ref 0.0–14.0)
NEUT%: 58.7 % (ref 39.0–75.0)
NEUTROS ABS: 2.8 10*3/uL (ref 1.5–6.5)
Platelets: 184 10*3/uL (ref 140–400)
RBC: 5.21 10*6/uL (ref 4.20–5.82)
RDW: 14.1 % (ref 11.0–14.6)
WBC: 4.7 10*3/uL (ref 4.0–10.3)
nRBC: 0 % (ref 0–0)

## 2014-02-10 LAB — POCT INR: INR: 3.7

## 2014-02-10 NOTE — Progress Notes (Signed)
**  Telephone encounter.  No charge**  INR slightly above goal of 2.5-3.5.  No bleeding or bruising.  Mr Paler as started taking Megared, which contains omega 3 fatty acid, at suggestion of Dr. Nedra Hai at [facility]gent care medical center.  He does not take everyday but several times a week and has been taking Megared for a couple weeks.  This can interact with coumadin and may account for elevated INR.  Mr Scerbo also states that his stress level is down since he has started on Megared.  I have instructed Mr Laprise to slightly decrease coumadin dose to 2.5mg  MWF and 5mg  other days (previously taking 2.5mg  M/F 5mg  other days) to account for addition of omega 2 fatty acid.  Mr Zelazny has an appt with Dr. Isaiah Serge at [facility] on 03/10/14 and will have INR checked while there.  Will have office fax INR result to [facility] pharmacy.

## 2014-02-12 ENCOUNTER — Telehealth: Payer: Self-pay | Admitting: Oncology

## 2014-02-12 ENCOUNTER — Telehealth: Payer: Self-pay | Admitting: Hematology & Oncology

## 2014-02-12 ENCOUNTER — Telehealth: Payer: Self-pay | Admitting: *Deleted

## 2014-02-12 ENCOUNTER — Other Ambulatory Visit: Payer: Self-pay | Admitting: *Deleted

## 2014-02-12 ENCOUNTER — Other Ambulatory Visit: Payer: Self-pay | Admitting: Physician Assistant

## 2014-02-12 DIAGNOSIS — I2699 Other pulmonary embolism without acute cor pulmonale: Secondary | ICD-10-CM

## 2014-02-12 NOTE — Telephone Encounter (Signed)
Received call from pt asking for return call.  Called pt & he states that his appt at [facility] was cancelled & may not get an appt until 6/29.  He would like to go to H.P office @ 8am week of 03/10/14.  He states mondays or Wednesday's are good @ 8am or earlier.  Verified with Thayer Ohm RPH/coumadin clinic & POF to scheduler to schedule in H.P. Office.

## 2014-02-12 NOTE — Telephone Encounter (Signed)
Pt aware of 6-24 lab

## 2014-02-12 NOTE — Telephone Encounter (Signed)
Dr Conley Rolls, you saw pt for CPE in April, but this med was not discussed in notes. Do you want to RX for pt?

## 2014-02-12 NOTE — Telephone Encounter (Signed)
Sent Antonio [name] a staff message regarding this pt needing a lab appt at the [facility] office on mon or wed. Pt of dr Darnelle Catalan

## 2014-02-23 ENCOUNTER — Telehealth: Payer: Self-pay

## 2014-02-23 MED ORDER — TESTOSTERONE 20.25 MG/1.25GM (1.62%) TD GEL: 1.2500 g | Freq: Every day | TRANSDERMAL | Status: DC

## 2014-02-23 NOTE — Telephone Encounter (Signed)
i printed refill Ov is due 

## 2014-02-23 NOTE — Telephone Encounter (Signed)
Rx faxed to pharmacy  

## 2014-02-23 NOTE — Telephone Encounter (Signed)
Received refill request for Androgel 1.62 %. Pt was last seen on 05/08/2013 and med was last refilled on 05/14/2013. Thanks!

## 2014-03-05 ENCOUNTER — Telehealth: Payer: Self-pay

## 2014-03-05 NOTE — Telephone Encounter (Signed)
Patient had a  Missed call.  Please call again.   [phone]

## 2014-03-10 ENCOUNTER — Other Ambulatory Visit: Payer: Self-pay | Admitting: Nurse Practitioner

## 2014-03-10 ENCOUNTER — Other Ambulatory Visit: Payer: Self-pay | Admitting: Lab

## 2014-03-10 DIAGNOSIS — Q984 Klinefelter syndrome, unspecified: Secondary | ICD-10-CM

## 2014-03-11 ENCOUNTER — Other Ambulatory Visit (HOSPITAL_BASED_OUTPATIENT_CLINIC_OR_DEPARTMENT_OTHER): Payer: BC Managed Care – PPO | Admitting: Lab

## 2014-03-11 ENCOUNTER — Ambulatory Visit (INDEPENDENT_AMBULATORY_CARE_PROVIDER_SITE_OTHER): Payer: BC Managed Care – PPO | Admitting: Pharmacist

## 2014-03-11 DIAGNOSIS — I2699 Other pulmonary embolism without acute cor pulmonale: Secondary | ICD-10-CM

## 2014-03-11 DIAGNOSIS — Q984 Klinefelter syndrome, unspecified: Secondary | ICD-10-CM

## 2014-03-11 DIAGNOSIS — D6859 Other primary thrombophilia: Secondary | ICD-10-CM

## 2014-03-11 DIAGNOSIS — I82409 Acute embolism and thrombosis of unspecified deep veins of unspecified lower extremity: Secondary | ICD-10-CM

## 2014-03-11 LAB — PROTIME-INR (CHCC SATELLITE)
INR: 2.3 (ref 2.0–3.5)
PROTIME: 27.6 s — AB (ref 10.6–13.4)

## 2014-03-11 LAB — CBC WITH DIFFERENTIAL (CANCER CENTER ONLY)
BASO#: 0 10*3/uL (ref 0.0–0.2)
BASO%: 0.4 % (ref 0.0–2.0)
EOS ABS: 0.2 10*3/uL (ref 0.0–0.5)
EOS%: 4.1 % (ref 0.0–7.0)
HCT: 45.3 % (ref 38.7–49.9)
HGB: 16.4 g/dL (ref 13.0–17.1)
LYMPH#: 1.3 10*3/uL (ref 0.9–3.3)
LYMPH%: 25 % (ref 14.0–48.0)
MCH: 31 pg (ref 28.0–33.4)
MCHC: 36.2 g/dL — ABNORMAL HIGH (ref 32.0–35.9)
MCV: 86 fL (ref 82–98)
MONO#: 0.5 10*3/uL (ref 0.1–0.9)
MONO%: 9.5 % (ref 0.0–13.0)
NEUT%: 61 % (ref 40.0–80.0)
NEUTROS ABS: 3.3 10*3/uL (ref 1.5–6.5)
PLATELETS: 190 10*3/uL (ref 145–400)
RBC: 5.29 10*6/uL (ref 4.20–5.70)
RDW: 13.6 % (ref 11.1–15.7)
WBC: 5.4 10*3/uL (ref 4.0–10.0)

## 2014-03-11 LAB — POCT INR: INR: 2.3

## 2014-03-11 NOTE — Progress Notes (Signed)
NO CHARGE- TELEPHONE ENCOUNTER ONLY  INR = 2.3  Goal 2.5-3.5 INR just below goal range. No complications of anticoagulation noted. Patient states he has been taking Megared which is an Omega 3 fatty acid supplement. His PCP is increasing his dose from 1 x 300 mg to 3 x 300 mg daily. This can potentially increase his INR. [facility] has been changed to 05/10/14, he was not there this week. He is currently in the process of changing jobs. We will cecrease Coumadin 5mg  daily except 2.5 mg on Monday Wednesday and Friday. [facility]/15/14 at 08:00am.  Cletis AthensLisa Halcomb, PharmD

## 2014-03-31 ENCOUNTER — Telehealth: Payer: Self-pay | Admitting: Pharmacist

## 2014-03-31 NOTE — Telephone Encounter (Addendum)
Pt called to let us know that he will be in [place] on 04/16/14 and would like change his lab apt to [facility] ([facility] Lab = 10:30am CC = 10:45am  Pt also wanted to let us know that he had bright red blood in his stool during/after a bowel movement recently. The amount was more than "a little" and has only happened once. I offered to have the patient come in and check his INR this week.  He declined for now. Pt will wait. He will call us if the bleeding continues or the amount increases. He feels this may be an isolated happening.

## 2014-04-06 ENCOUNTER — Telehealth: Payer: Self-pay | Admitting: Hematology & Oncology

## 2014-04-06 NOTE — Telephone Encounter (Signed)
Patient called and sch a lab apt for patient on 04/13/14

## 2014-04-10 ENCOUNTER — Other Ambulatory Visit: Payer: Self-pay | Admitting: *Deleted

## 2014-04-10 DIAGNOSIS — Q984 Klinefelter syndrome, unspecified: Secondary | ICD-10-CM

## 2014-04-13 ENCOUNTER — Other Ambulatory Visit (HOSPITAL_BASED_OUTPATIENT_CLINIC_OR_DEPARTMENT_OTHER): Payer: BC Managed Care – PPO | Admitting: Lab

## 2014-04-13 DIAGNOSIS — D6859 Other primary thrombophilia: Secondary | ICD-10-CM

## 2014-04-13 DIAGNOSIS — Q984 Klinefelter syndrome, unspecified: Secondary | ICD-10-CM

## 2014-04-13 LAB — CBC WITH DIFFERENTIAL (CANCER CENTER ONLY)
BASO#: 0 10*3/uL (ref 0.0–0.2)
BASO%: 0.5 % (ref 0.0–2.0)
EOS%: 5.6 % (ref 0.0–7.0)
Eosinophils Absolute: 0.3 10*3/uL (ref 0.0–0.5)
HEMATOCRIT: 42.4 % (ref 38.7–49.9)
HGB: 15.1 g/dL (ref 13.0–17.1)
LYMPH#: 1.6 10*3/uL (ref 0.9–3.3)
LYMPH%: 28.4 % (ref 14.0–48.0)
MCH: 30.6 pg (ref 28.0–33.4)
MCHC: 35.6 g/dL (ref 32.0–35.9)
MCV: 86 fL (ref 82–98)
MONO#: 0.5 10*3/uL (ref 0.1–0.9)
MONO%: 8.4 % (ref 0.0–13.0)
NEUT%: 57.1 % (ref 40.0–80.0)
NEUTROS ABS: 3.2 10*3/uL (ref 1.5–6.5)
Platelets: 163 10*3/uL (ref 145–400)
RBC: 4.94 10*6/uL (ref 4.20–5.70)
RDW: 13 % (ref 11.1–15.7)
WBC: 5.6 10*3/uL (ref 4.0–10.0)

## 2014-04-13 LAB — PROTIME-INR (CHCC SATELLITE)
INR: 2.7 (ref 2.0–3.5)
Protime: 32.4 Seconds — ABNORMAL HIGH (ref 10.6–13.4)

## 2014-04-13 LAB — POCT INR: INR: 2.7

## 2014-04-14 ENCOUNTER — Telehealth: Payer: Self-pay | Admitting: *Deleted

## 2014-04-14 NOTE — Telephone Encounter (Addendum)
Message copied by Burnett CorrenteUMLEY, Robbi Spells L on Tue Apr 14, 2014 12:46 PM ------      Message from: Josph MachoENNEVER, PETER R      Created: Tue Apr 14, 2014  7:03 AM       Call - INR is perfect.  No change in coumadin.  pete ------Informed pt that INR is perfect and there is no change in Coumadin.

## 2014-04-15 ENCOUNTER — Other Ambulatory Visit: Payer: Self-pay | Admitting: Lab

## 2014-04-16 ENCOUNTER — Ambulatory Visit (HOSPITAL_BASED_OUTPATIENT_CLINIC_OR_DEPARTMENT_OTHER): Payer: BC Managed Care – PPO | Admitting: Pharmacist

## 2014-04-16 ENCOUNTER — Other Ambulatory Visit: Payer: Self-pay

## 2014-04-16 DIAGNOSIS — D6859 Other primary thrombophilia: Secondary | ICD-10-CM

## 2014-04-16 DIAGNOSIS — I82409 Acute embolism and thrombosis of unspecified deep veins of unspecified lower extremity: Secondary | ICD-10-CM

## 2014-04-16 DIAGNOSIS — I2699 Other pulmonary embolism without acute cor pulmonale: Secondary | ICD-10-CM

## 2014-04-16 DIAGNOSIS — Z7901 Long term (current) use of anticoagulants: Secondary | ICD-10-CM

## 2014-04-16 NOTE — Progress Notes (Signed)
INR from 04/13/14 = 2.7 @ [facility]. CBC from 04/13/14 @ [facility] is wnl. Labs reviewed by Dr. Myna HidalgoEnnever on 04/13/14 and instructed patient to stay on the same coumadin dose. See 04/13/14 Telephone notes. Continue Coumadin 5mg  daily except 2.5mg  on Monday Wednesday and Friday.  Recheck INR with scheduled appointments on 05/11/14; lab at 8:30am, Dr Darnelle CatalanMagrinat at [facility] and coumadin clinic at 9:30am.  No charge encounter - Spoke with patient by telephone.

## 2014-04-16 NOTE — Patient Instructions (Signed)
Continue Coumadin 5mg  daily except 2.5mg  on Monday Wednesday and Friday.  Recheck INR with scheduled appointments on 05/11/14; lab at 8:30am, Dr Darnelle CatalanMagrinat at [facility] and coumadin clinic at 9:30am.

## 2014-04-17 ENCOUNTER — Telehealth: Payer: Self-pay | Admitting: Oncology

## 2014-04-17 NOTE — Telephone Encounter (Signed)
per pof to sch pt CC per Ninfa MeekerBraandy pt aware

## 2014-04-27 NOTE — Telephone Encounter (Signed)
Phone call - encounter closed. 

## 2014-05-06 ENCOUNTER — Telehealth: Payer: Self-pay | Admitting: Oncology

## 2014-05-06 NOTE — Telephone Encounter (Signed)
, °

## 2014-05-11 ENCOUNTER — Other Ambulatory Visit: Payer: Self-pay

## 2014-05-11 ENCOUNTER — Ambulatory Visit: Payer: Self-pay | Admitting: Oncology

## 2014-05-11 ENCOUNTER — Ambulatory Visit: Payer: Self-pay

## 2014-05-14 ENCOUNTER — Other Ambulatory Visit: Payer: Self-pay | Admitting: Internal Medicine

## 2014-05-14 ENCOUNTER — Other Ambulatory Visit: Payer: Self-pay | Admitting: *Deleted

## 2014-05-14 DIAGNOSIS — I82409 Acute embolism and thrombosis of unspecified deep veins of unspecified lower extremity: Secondary | ICD-10-CM

## 2014-05-14 MED ORDER — WARFARIN SODIUM 5 MG PO TABS: 5.0000 mg | ORAL_TABLET | Freq: Every day | ORAL | Status: DC

## 2014-05-15 ENCOUNTER — Ambulatory Visit: Payer: Self-pay

## 2014-05-15 ENCOUNTER — Telehealth: Payer: Self-pay | Admitting: Pharmacist

## 2014-05-15 ENCOUNTER — Other Ambulatory Visit: Payer: Self-pay

## 2014-05-15 NOTE — Telephone Encounter (Signed)
Patient FTKA in Coumadin clinic today.   Left VM asking him to call and reschedule.

## 2014-05-19 ENCOUNTER — Ambulatory Visit (HOSPITAL_BASED_OUTPATIENT_CLINIC_OR_DEPARTMENT_OTHER): Payer: BC Managed Care – PPO | Admitting: Pharmacist

## 2014-05-19 ENCOUNTER — Other Ambulatory Visit (HOSPITAL_BASED_OUTPATIENT_CLINIC_OR_DEPARTMENT_OTHER): Payer: BC Managed Care – PPO

## 2014-05-19 ENCOUNTER — Telehealth: Payer: Self-pay | Admitting: Oncology

## 2014-05-19 DIAGNOSIS — Z7901 Long term (current) use of anticoagulants: Secondary | ICD-10-CM

## 2014-05-19 DIAGNOSIS — I82409 Acute embolism and thrombosis of unspecified deep veins of unspecified lower extremity: Secondary | ICD-10-CM

## 2014-05-19 DIAGNOSIS — I2699 Other pulmonary embolism without acute cor pulmonale: Secondary | ICD-10-CM

## 2014-05-19 DIAGNOSIS — D6859 Other primary thrombophilia: Secondary | ICD-10-CM

## 2014-05-19 LAB — PROTIME-INR
INR: 4.1 — AB (ref 2.00–3.50)
Protime: 49.2 Seconds — ABNORMAL HIGH (ref 10.6–13.4)

## 2014-05-19 LAB — POCT INR: INR: 4.1

## 2014-05-19 MED ORDER — WARFARIN SODIUM 5 MG PO TABS: 5.0000 mg | ORAL_TABLET | Freq: Every day | ORAL | Status: DC

## 2014-05-19 NOTE — Telephone Encounter (Signed)
per Gearldine Bienenstock to add CC & lab app for 9/1-pt aware

## 2014-05-19 NOTE — Progress Notes (Signed)
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  Recheck INR with scheduled appointments on 06/05/14; lab at 3:30pm and coumadin clinic at 3:45pm

## 2014-05-19 NOTE — Patient Instructions (Signed)
Continue Coumadin  daily except 2.5mg  on Monday, Tuesday, Wednesday and Friday. Recheck INR with scheduled appointments on 06/05/14; lab at 3:30pm and coumadin clinic at 3:45pm

## 2014-05-20 ENCOUNTER — Telehealth: Payer: Self-pay | Admitting: Oncology

## 2014-05-20 NOTE — Telephone Encounter (Signed)
per Ike Bene to sch pt CC * lab-pt aware

## 2014-06-05 ENCOUNTER — Telehealth: Payer: Self-pay | Admitting: Pharmacist

## 2014-06-05 ENCOUNTER — Other Ambulatory Visit: Payer: Self-pay

## 2014-06-05 ENCOUNTER — Ambulatory Visit: Payer: Self-pay

## 2014-06-05 NOTE — Telephone Encounter (Signed)
Pt called to cancel lab and CC appts for today. His wife is in the hospital in [place]. He will call back next week to R/S.

## 2014-06-08 ENCOUNTER — Ambulatory Visit (HOSPITAL_BASED_OUTPATIENT_CLINIC_OR_DEPARTMENT_OTHER): Payer: BC Managed Care – PPO | Admitting: Pharmacist

## 2014-06-08 ENCOUNTER — Other Ambulatory Visit (HOSPITAL_BASED_OUTPATIENT_CLINIC_OR_DEPARTMENT_OTHER): Payer: BC Managed Care – PPO

## 2014-06-08 DIAGNOSIS — Z7901 Long term (current) use of anticoagulants: Secondary | ICD-10-CM

## 2014-06-08 DIAGNOSIS — I2699 Other pulmonary embolism without acute cor pulmonale: Secondary | ICD-10-CM

## 2014-06-08 DIAGNOSIS — I82409 Acute embolism and thrombosis of unspecified deep veins of unspecified lower extremity: Secondary | ICD-10-CM

## 2014-06-08 DIAGNOSIS — D6859 Other primary thrombophilia: Secondary | ICD-10-CM

## 2014-06-08 LAB — PROTIME-INR
INR: 3.6 — AB (ref 2.00–3.50)
PROTIME: 43.2 s — AB (ref 10.6–13.4)

## 2014-06-08 LAB — POCT INR: INR: 3.6

## 2014-06-08 NOTE — Patient Instructions (Signed)
INR at goal No changes Continue Coumadin  daily except 2.5mg  on Monday, Tuesday, Wednesday and Friday.  Recheck INR with scheduled appointments on 07/06/14; lab at 3:30pm and coumadin clinic at 3:45pm.

## 2014-06-08 NOTE — Progress Notes (Signed)
INR right at goal Pt is doing well with no complaints No unusual bleeding or bruising No missed or extra doses Pt did have a salad for dinner when he went out to eat on Friday night (he usually does not eat salads) Plan: No changes Continue Coumadin  daily except 2.5mg  on Monday, Tuesday, Wednesday and Friday.  Recheck INR on 07/06/14; lab at 3:30pm and coumadin clinic at 3:45pm.

## 2014-06-15 ENCOUNTER — Telehealth: Payer: Self-pay | Admitting: Oncology

## 2014-06-15 NOTE — Telephone Encounter (Signed)
per Thayer Ohm to sch CC for pt-pt aware

## 2014-07-06 ENCOUNTER — Other Ambulatory Visit (HOSPITAL_BASED_OUTPATIENT_CLINIC_OR_DEPARTMENT_OTHER): Payer: BC Managed Care – PPO

## 2014-07-06 ENCOUNTER — Ambulatory Visit (HOSPITAL_BASED_OUTPATIENT_CLINIC_OR_DEPARTMENT_OTHER): Payer: BC Managed Care – PPO | Admitting: Pharmacist

## 2014-07-06 DIAGNOSIS — Z7901 Long term (current) use of anticoagulants: Secondary | ICD-10-CM

## 2014-07-06 DIAGNOSIS — I2699 Other pulmonary embolism without acute cor pulmonale: Secondary | ICD-10-CM

## 2014-07-06 DIAGNOSIS — D6859 Other primary thrombophilia: Secondary | ICD-10-CM

## 2014-07-06 LAB — PROTIME-INR
INR: 2.9 (ref 2.00–3.50)
Protime: 34.8 Seconds — ABNORMAL HIGH (ref 10.6–13.4)

## 2014-07-06 LAB — POCT INR: INR: 2.9

## 2014-07-06 NOTE — Progress Notes (Signed)
INR back at goal of 2-3.  No changes in meds.  No bleeding or bruising.  Mr Antonio [name] has been taking coumadin 2.5mg  M/Tu/W/F and 5mg  other days.  Will continue same weekly dose but change to 5mg  MWF and 2.5mg  other days for ease of remembering schedule.  Will check PT/INR in 1 month.  MS Antonio [name] prefers to come to coumadin clinic day after Thanksgiving due to work schedule.  He has an app with Dr Darnelle CatalanMagrinat on 07/14/14.

## 2014-07-07 ENCOUNTER — Telehealth: Payer: Self-pay | Admitting: Oncology

## 2014-07-07 NOTE — Telephone Encounter (Signed)
per Melissa to sch CC-pt aware °

## 2014-07-10 ENCOUNTER — Telehealth: Payer: Self-pay | Admitting: Oncology

## 2014-07-10 NOTE — Telephone Encounter (Signed)
, °

## 2014-07-14 ENCOUNTER — Ambulatory Visit: Payer: Self-pay | Admitting: Oncology

## 2014-07-26 ENCOUNTER — Other Ambulatory Visit: Payer: Self-pay | Admitting: Oncology

## 2014-07-27 ENCOUNTER — Telehealth: Payer: Self-pay | Admitting: Oncology

## 2014-07-31 ENCOUNTER — Ambulatory Visit (INDEPENDENT_AMBULATORY_CARE_PROVIDER_SITE_OTHER): Payer: BC Managed Care – PPO | Admitting: Family Medicine

## 2014-07-31 ENCOUNTER — Encounter: Payer: Self-pay | Admitting: Family Medicine

## 2014-07-31 VITALS — BP 100/62 | HR 76 | Temp 98.4°F | Resp 16 | Ht 67.75 in | Wt 173.2 lb

## 2014-07-31 DIAGNOSIS — M1990 Unspecified osteoarthritis, unspecified site: Secondary | ICD-10-CM

## 2014-07-31 DIAGNOSIS — M79643 Pain in unspecified hand: Secondary | ICD-10-CM

## 2014-07-31 DIAGNOSIS — Z7901 Long term (current) use of anticoagulants: Secondary | ICD-10-CM

## 2014-07-31 LAB — RHEUMATOID FACTOR: Rheumatoid fact SerPl-aCnc: <10 [IU]/mL (ref <=14)

## 2014-07-31 LAB — POCT SEDIMENTATION RATE: POCT SED RATE: 16 mm/h (ref 0–22)

## 2014-07-31 NOTE — Progress Notes (Signed)
 Chief Complaint:  Chief Complaint  Patient presents with  . arthritis in fingers of the joint  . would like referral to Rheumatologist    HPI: Antonio [name] is a 58 y.o. male who is here for  Worsening Joint pain, nightime to morning event, started rapidly 6 months ago, the pain comes and goes.  Intermittent weather effects it . Rain and humidity makes it better. No numbness or tingling. He does use his finger to scroll across computer screen but that is about it. Still able to hold and grasp things. He ahs no hx of RA in family that he knows of. No change in coloration of fingers or toes, he has a hx of clotting disorder which sounds like anitphospholipid so is on chornic anticoagulation for this  And hx of DVT   Past Medical History  Diagnosis Date  . GERD (gastroesophageal reflux disease)   . Asthma   . Klinefelter syndrome   . Hyperlipidemia   . Chronic headaches   . Eosinophilic esophagitis   . Seasonal allergies   . Cervical spine degeneration   . Cataract   . Allergy   . Clotting disorder    Past Surgical History  Procedure Laterality Date  . Cholecystectomy      ERCP for cbd stones  . Tonsillectomy    . Cataract surgery      bilateral  . Hand tendon surgery      left, work comp  . Ercp    . Colonoscopy    . Upper gastrointestinal endoscopy     History   Social History  . Marital Status: Married    Spouse Name: Caren Griffins    Number of Children: 0  . 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 Smoking status: Passive Smoke Exposure - Never Smoker    Types: Cigarettes  . Smokeless tobacco: Never Used  . Alcohol Use: No  . Drug Use: No  . Sexual Activity:    Partners: Female   Other Topics Concern  . None   Social History Narrative   Lives with his wife. She has been diagnosed with COPD and continues to smoke.   Family History  Problem Relation Age of Onset  . Colon cancer Neg Hx   .  Irritable bowel syndrome Sister   . Allergies Sister   . Allergies Mother    Allergies  Allergen Reactions  . Codeine Other (See Comments)    REACTION: difficulty breathing, nausea   Prior to Admission medications   Medication Sig Start Date End Date Taking? Authorizing Provider  montelukast (SINGULAIR) 10 MG tablet TAKE 1 TABLET BY MOUTH ONCE DAILY 02/12/14  Yes  P , DO  Multiple Vitamins-Minerals (CENTRUM SILVER PO) Take 1 tablet by mouth daily. 02/24/13  Yes Historical Provider, MD  OVER THE COUNTER MEDICATION OTC Mega Red Krill Oil 300 mg taking everyday   Yes Historical Provider, MD  OVER THE COUNTER MEDICATION OTC Loratadine 10 mg taking daily   Yes Historical Provider, MD  pantoprazole (PROTONIX) 40 MG tablet TAKE 1 TABLET BY MOUTH DAILY. 05/14/14  Yes Gatha Mayer, MD  PROAIR HFA 108 [phone] BASE) MCG/ACT inhaler INHALE 1 TO 2 PUFFS BY MOUTH EVERY 4 TO 6 HOURS AS NEEDED 01/16/14  Yes  P , DO  Testosterone (ANDROGEL) 20.25 MG/1.25GM (1.62%) GEL Place 1.25 g onto the skin daily. 02/23/14  Yes Renato Shin, MD  warfarin (COUMADIN) 5 MG tablet Take 1 tablet (5 mg total) by mouth daily. 05/19/14  Yes Chauncey Cruel, MD     ROS: The patient denies fevers, chills, night sweats, unintentional weight loss, chest pain, palpitations, wheezing, dyspnea on exertion, nausea, vomiting, abdominal pain, dysuria, hematuria, melena, numbness, weakness, or tingling.   All other systems have been reviewed and were otherwise negative with the exception of those mentioned in the HPI and as above.    PHYSICAL EXAM: Filed Vitals:   07/31/14 1153  BP: 100/62  Pulse: 76  Temp: 98.4 F (36.9 C)  Resp: 16   Filed Vitals:   07/31/14 1153  Height: 5' 7.75" (1.721 m)  Weight: 173 lb 3.2 oz (78.563 kg)   Body mass index is 26.53 kg/(m^2).  General: Alert, no acute distress HEENT:  Normocephalic, atraumatic, oropharynx patent. EOMI, PERRLA Cardiovascular:  Regular rate and rhythm, no rubs  murmurs or gallops.  radial pulse intact. No pedal edema.  Respiratory: Clear to auscultation bilaterally.  No wheezes, rales, or rhonchi.  No cyanosis, no use of accessory musculature GI: No organomegaly, abdomen is soft and non-tender, positive bowel sounds.  No masses. Skin: No rashes. Neurologic: Facial musculature symmetric. Psychiatric: Patient is appropriate throughout our interaction. Lymphatic: No cervical lymphadenopathy Musculoskeletal: Gait intact. + nodes bialteral hands at DIP and minimally at PIP 5/5 strength bilaterally, sensation intact Some tenderness at base of thumb   LABS: Results for orders placed or performed in visit on 07/06/14  POCT INR  Result Value Ref Range   INR 2.9      EKG/XRAY:   Primary read interpreted by Dr. Marin Comment at [facility].   ASSESSMENT/PLAN: Encounter Diagnoses  Name Primary?  . Hand joint pain, unspecified laterality Yes  . Arthritis   . Chronic anticoagulation    Tylenol prn He is on antiicoagulation meds so cannot take NSAIDs.  I don't feel the the need for xrays at this time since he has it bialterally and it appears to very much like OA but we can if sxs woresen If he wants rehemaptology referral after labs then we can do that as well RF and also ESR pending Fu prn   Gross sideeffects, risk and benefits, and alternatives of medications d/w patient. Patient is aware that all medications have potential sideeffects and we are unable to predict every sideeffect or drug-drug interaction that may occur.  , Perry, DO 07/31/2014 12:36 PM

## 2014-08-03 ENCOUNTER — Other Ambulatory Visit: Payer: Self-pay | Admitting: Oncology

## 2014-08-07 ENCOUNTER — Ambulatory Visit (HOSPITAL_BASED_OUTPATIENT_CLINIC_OR_DEPARTMENT_OTHER): Payer: BC Managed Care – PPO | Admitting: Oncology

## 2014-08-07 VITALS — BP 125/67 | HR 83 | Temp 97.5°F | Resp 18 | Ht 67.75 in | Wt 175.9 lb

## 2014-08-07 DIAGNOSIS — I82401 Acute embolism and thrombosis of unspecified deep veins of right lower extremity: Secondary | ICD-10-CM

## 2014-08-07 DIAGNOSIS — I82409 Acute embolism and thrombosis of unspecified deep veins of unspecified lower extremity: Secondary | ICD-10-CM

## 2014-08-07 DIAGNOSIS — D6852 Prothrombin gene mutation: Secondary | ICD-10-CM

## 2014-08-07 DIAGNOSIS — I2699 Other pulmonary embolism without acute cor pulmonale: Secondary | ICD-10-CM

## 2014-08-07 DIAGNOSIS — Q984 Klinefelter syndrome, unspecified: Secondary | ICD-10-CM

## 2014-08-07 DIAGNOSIS — D6859 Other primary thrombophilia: Secondary | ICD-10-CM

## 2014-08-07 NOTE — Progress Notes (Signed)
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 He was diagnosed in 40 at [facility] and was started on testosterone supplementation then. More recently his testosterone level was found to be somewhat lower than expected and his dose was increased (April 2014). 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    A few weeks later, on 02/08/2013 he presented to Urgent Care with swelling in his Right leg and Dopplers showed acute deep vein thrombosis involving posterior tibial and peroneal veins, coursing through to the popliteal and extending to mid femoral veins of the right lower extremity. He was started on xarelto/ rivaroxaban, and purchsed 15 tablets initially for $142, then 27 tablets for $250 (he used a company coupon so actually only paid $5).  Repeat dopplers 02/14/2013 showed acute to subacute deep vein thrombosis involving the right femoral, popliteal, proximal posterior tibial, and proximal peroneal veins. This was felt to be at least stable. However this morning (02/19/2013) the patient developed some strange crawling feeling in his Right leg folowed by chest pressure. He presented to the ED and CT-angio was positive for pulmonary embolus with evidence of both acute and chronic thromboembolic disease in the lungs bilaterally. All filling defects in the pulmonary   arteries appeared to  be nonocclusive. There was no evidence of pulmonary hemorrhage to suggest a frank pulmonary infarction.  Antonio [name] was admitted for IV heparinization and we were consulted for further management  His subsequent history is as detailed below       INTERVAL HISTORY: Antonio [name] returns today for followup of his coagulopathy. Since his last visit here he was evaluated by Dr.  Joan [facility]. Dr. mole repeated a factor II mutation and lupus anticoagulant, as well as a d-dimer. All of those were negative or normal. He concurred with the plan for lifelong anticoagulation. He also discussed the possibility of switching to a DOAC. Antonio [name] was very appreciative of the visit by distressed by the final bill. I believe he is negotiating that with [facility].   Antonio [name] continues on Coumadin, uses our Coumadin clinic here. He tells me it is not quite as quick as the Coumadin clinic in [place] but "I get better service".  REVIEW OF SYSTEMS: Antonio [name] continues on testosterone replacement for his Klinefelter syndrome under the direction of Dr. Ike Bene. He is using a 10% testosterone cream compounded by local pharmacy which is much less expensive than what he used before. He has some workers compensation problems involving his knees and neck. He has been evaluated by Dr. Mina Marble for this and the possibility of either injection or radiofrequency ablation for his C6-7 pain is being considered. Antonio [name] is concerned about his weight gain and is not exercising regularly at present. A detailed review of systems today was otherwise entirely negative and in particularly have been no bleeding or clotting problems.  PAST MEDICAL HISTORY: Past Medical History  Diagnosis Date  . GERD (gastroesophageal reflux disease)   . Asthma   . Klinefelter syndrome   . Hyperlipidemia   . Chronic headaches   . Eosinophilic esophagitis   . Seasonal allergies   . Cervical spine degeneration   . Cataract   . Allergy   . Clotting disorder     PAST SURGICAL  HISTORY: Past Surgical History  Procedure Laterality Date  . Cholecystectomy      ERCP for cbd stones  . Tonsillectomy    . Cataract surgery      bilateral  . Hand tendon surgery      left, work comp  . Ercp    . Colonoscopy    . Upper gastrointestinal endoscopy      FAMILY HISTORY Family History  Problem Relation Age of Onset  . Colon cancer Neg Hx   . Irritable bowel syndrome Sister   . Allergies Sister   . Allergies Mother   The patient's father drowned when the patient was 58 years old (rip tide [facility]). The patient's mother is currently 55. The patient has one sister. 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 His wife Caren Griffins used to work at [employer] but is now retired, with significant COPD problems--she is on 24/7 oxygen, has been admitted for CO retention, and according to Antonio [name] continues to smoke.Marland Kitchen She has 3 sons from a prior marriage ("a Tax inspector, a Nature conservation officer, and an optometrist"). They have 4 biological and 2 step grandchildren. The patient is not a church attender                          ADVANCED DIRECTIVES: in place   HEALTH MAINTENANCE:  (Updated 04/21/215) History  Substance Use Topics  . Smoking status: Passive Smoke Exposure - Never Smoker    Types: Cigarettes  . Smokeless tobacco: Never Used  . Alcohol Use: No               Colonoscopy: 2008/[facility]             PSA: April 2014, 0.52/[facility]             Lipid panel: April 2014/[facility]   Allergies  Allergen Reactions  . Codeine Other (See Comments)    REACTION: difficulty breathing, nausea    Current Outpatient Prescriptions  Medication Sig Dispense Refill  . montelukast (SINGULAIR) 10 MG tablet TAKE 1 TABLET BY MOUTH ONCE DAILY 90 tablet 3  .  Multiple Vitamins-Minerals (CENTRUM SILVER PO) Take 1 tablet by mouth daily.    Marland Kitchen OVER THE COUNTER MEDICATION OTC Mega Red Krill Oil 300 mg taking everyday    . OVER THE COUNTER MEDICATION OTC Loratadine 10 mg taking daily    . pantoprazole (PROTONIX) 40 MG tablet TAKE 1 TABLET BY MOUTH DAILY. 30 tablet 3  . PROAIR HFA 108 (90 BASE) MCG/ACT inhaler INHALE 1 TO 2 PUFFS BY MOUTH EVERY 4 TO 6 HOURS AS NEEDED 8.5 each 11  . Testosterone (ANDROGEL) 20.25 MG/1.25GM (1.62%) GEL Place 1.25 g onto the skin daily. 75 g 0  . warfarin (COUMADIN) 5 MG tablet Take 1 tablet (5 mg total) by mouth daily. 30 tablet 2   No current facility-administered medications for this visit.  OBJECTIVE:  Middle aged white man in no acute distress Filed Vitals:   08/07/14 1600  BP: 125/67  Pulse: 83  Temp: 97.5 F (36.4 C)  Resp: 18     Body mass index is 26.94 kg/(m^2).    ECOG FS: 0 Filed Weights   08/07/14 1600  Weight: 175 lb 14.4 oz (79.788 kg)   Sclerae unicteric, pupils round and equal Oropharynx clear, teeth in good repair No cervical or supraclavicular adenopathy Lungs no rales or rhonchi Heart regular rate and rhythm Abd soft, obese, nontender, positive bowel sounds MSK no focal spinal tenderness, grade 1 chronic RLE edema Neuro: nonfocal, well oriented, appropriate affect Skin: No bruising noted; no suspicious lesions  LAB RESULTS:  Results for CALVEN, GILKES (MRN [id number]) as of 08/08/2014 07:56  Ref. Range 05/19/2014 16:46 06/08/2014 15:00 06/08/2014 15:33 07/06/2014 00:00 07/06/2014 15:53  INR Latest Range: 0.00-1.49  4.1 3.6 3.60 (H) 2.9 2.90   No results for input(s): INR in the last 168 hours.  STUDIES: No results found.   ASSESSMENT: 58 y.o.  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 We did discuss the recent development of Praxbind/ idarucizumab, a reversal agent for Pradaxa/ dabigratan. This is relevant to him since his main reason to continue Coumadin is that it can be easily reversed. At this point however he is not interested in switching.  He is also concerned regarding possible bleeding problems related to planned injections or radiofrequency ablation procedures. He understands if we have a firm date we can arrange for a Lovenox bridge and he can safely undergo these procedures as needed.  Otherwise we are continuing Coumadin monitoring through our clinic here. He will see me on a once a year basis. For cost reasons he is unwilling to return to see Dr. Joan Flores "unless I've met my deductible". Possibly he will have met his deductible by the second-half of next year so a return visit then might be possible.   Chauncey Cruel, MD   08/07/2014 4:10 PM

## 2014-08-10 ENCOUNTER — Telehealth: Payer: Self-pay | Admitting: Oncology

## 2014-08-10 NOTE — Telephone Encounter (Signed)
per pof to sch pt appt-cld & left pt message for the time & date of appt-mailed copy of sch

## 2014-08-10 NOTE — Addendum Note (Signed)
Addended by: Billey CoDD, VALERIE P on: 08/10/2014 07:03 PM   Modules accepted: Medications

## 2014-08-14 ENCOUNTER — Other Ambulatory Visit (HOSPITAL_BASED_OUTPATIENT_CLINIC_OR_DEPARTMENT_OTHER): Payer: BC Managed Care – PPO

## 2014-08-14 ENCOUNTER — Ambulatory Visit (HOSPITAL_BASED_OUTPATIENT_CLINIC_OR_DEPARTMENT_OTHER): Payer: Self-pay | Admitting: Pharmacist

## 2014-08-14 DIAGNOSIS — I2699 Other pulmonary embolism without acute cor pulmonale: Secondary | ICD-10-CM

## 2014-08-14 DIAGNOSIS — Z7901 Long term (current) use of anticoagulants: Secondary | ICD-10-CM

## 2014-08-14 DIAGNOSIS — D6859 Other primary thrombophilia: Secondary | ICD-10-CM

## 2014-08-14 DIAGNOSIS — D6852 Prothrombin gene mutation: Secondary | ICD-10-CM

## 2014-08-14 LAB — PROTIME-INR
INR: 2.2 (ref 2.00–3.50)
Protime: 26.4 Seconds — ABNORMAL HIGH (ref 10.6–13.4)

## 2014-08-14 LAB — POCT INR: INR: 2.2

## 2014-08-14 NOTE — Progress Notes (Signed)
PT seen in clinic today INR=2.2 Pt states his goal is now 2-3.  Verified per last MD note No other changes to report.  Trying to cut sugars and asking about Xylitol (sugar substitute)  Coumadin 103m M/WF and 2.548mother days.    Recheck INR on 12/28 at 3:30pm for lab and 3:45 for CC.  Pt request being seen prior to end of year, as he has met his deductible for this year already.

## 2014-08-14 NOTE — Patient Instructions (Signed)
Coumadin 5mg  M/WF and 2.5mg  other days.   Recheck INR on 12/28 at 3:30pm for lab and 3:45 for CC.

## 2014-08-18 ENCOUNTER — Telehealth: Payer: Self-pay | Admitting: Oncology

## 2014-08-18 NOTE — Telephone Encounter (Signed)
per kolleen to sch CC-pt aware

## 2014-09-14 ENCOUNTER — Other Ambulatory Visit: Payer: Self-pay | Admitting: Internal Medicine

## 2014-09-14 ENCOUNTER — Other Ambulatory Visit: Payer: Self-pay

## 2014-09-14 ENCOUNTER — Other Ambulatory Visit: Payer: Self-pay | Admitting: Oncology

## 2014-09-14 ENCOUNTER — Ambulatory Visit (HOSPITAL_BASED_OUTPATIENT_CLINIC_OR_DEPARTMENT_OTHER): Payer: BC Managed Care – PPO | Admitting: Pharmacist

## 2014-09-14 ENCOUNTER — Other Ambulatory Visit (HOSPITAL_BASED_OUTPATIENT_CLINIC_OR_DEPARTMENT_OTHER): Payer: BC Managed Care – PPO

## 2014-09-14 DIAGNOSIS — I2699 Other pulmonary embolism without acute cor pulmonale: Secondary | ICD-10-CM

## 2014-09-14 DIAGNOSIS — D6859 Other primary thrombophilia: Secondary | ICD-10-CM

## 2014-09-14 DIAGNOSIS — D6852 Prothrombin gene mutation: Secondary | ICD-10-CM

## 2014-09-14 DIAGNOSIS — Z7901 Long term (current) use of anticoagulants: Secondary | ICD-10-CM

## 2014-09-14 LAB — POCT INR: INR: 2.4

## 2014-09-14 LAB — PROTIME-INR
INR: 2.4 (ref 2.00–3.50)
PROTIME: 28.8 s — AB (ref 10.6–13.4)

## 2014-09-14 NOTE — Progress Notes (Signed)
INR = 2.4   Goal 2.5-3.5 INR just below goal range and stable. No medication changes. Patient plans to start diet for weight loss soon. He will eat iceburg lettuce, will stay away from dark green lettuces and greens. Diet will primarily be eating healthier foods and drinking more water. He is enjoying his new job with [employer]. He will continue Coumadin 5mg  M/WF and 2.5mg  other days. We will recheck INR on 10/23/14 at 3:30pm for lab and 3:45 for CC.  Cletis AthensLisa Lyncoln Maskell, PharmD

## 2014-10-23 ENCOUNTER — Telehealth: Payer: Self-pay | Admitting: Oncology

## 2014-10-23 ENCOUNTER — Other Ambulatory Visit: Payer: Self-pay

## 2014-10-23 ENCOUNTER — Ambulatory Visit: Payer: Self-pay

## 2014-10-23 NOTE — Telephone Encounter (Signed)
[facility]-pt aware

## 2014-10-27 ENCOUNTER — Telehealth: Payer: Self-pay | Admitting: Oncology

## 2014-10-27 NOTE — Telephone Encounter (Signed)
per Ike BeneKolleen to r/s pt CC-pt aware

## 2014-10-30 ENCOUNTER — Ambulatory Visit (HOSPITAL_BASED_OUTPATIENT_CLINIC_OR_DEPARTMENT_OTHER): Payer: BLUE CROSS/BLUE SHIELD | Admitting: Pharmacist

## 2014-10-30 ENCOUNTER — Other Ambulatory Visit: Payer: BLUE CROSS/BLUE SHIELD

## 2014-10-30 DIAGNOSIS — I82401 Acute embolism and thrombosis of unspecified deep veins of right lower extremity: Secondary | ICD-10-CM

## 2014-10-30 DIAGNOSIS — D6852 Prothrombin gene mutation: Secondary | ICD-10-CM

## 2014-10-30 DIAGNOSIS — D6859 Other primary thrombophilia: Secondary | ICD-10-CM

## 2014-10-30 DIAGNOSIS — I2699 Other pulmonary embolism without acute cor pulmonale: Secondary | ICD-10-CM

## 2014-10-30 DIAGNOSIS — Z7901 Long term (current) use of anticoagulants: Secondary | ICD-10-CM

## 2014-10-30 LAB — PROTIME-INR
INR: 1.9 — AB (ref 2.00–3.50)
PROTIME: 22.8 s — AB (ref 10.6–13.4)

## 2014-10-30 LAB — POCT INR: INR: 1.9

## 2014-10-30 NOTE — Patient Instructions (Signed)
Take 7.5mg  today. On 10/31/14, continue coumadin 5mg  M/WF and 2.5mg  other days.    Recheck INR on 12/04/14 at 3:30pm for lab and 3:45 for coumadin clinic.

## 2014-10-30 NOTE — Progress Notes (Signed)
INR slightly below goal today. INR goal updated from 2.5-3.5 to new INR goal of 2-3. [facility] and Dr. Darnelle CatalanMagrinat. Patient has been eating more nuts and almonds. He also started on Move Free Ultra (Type II collagenase, hyaluronic acid, Boron) about 3 weeks ago. No interactions identified with coumadin. No missed or extra coumadin doses. No problems or concerns regarding anticoagulation. No unusual bruising. No bleeding noted. No s/s of clotting noted. Pt may need to have a tooth pulled in the next few months. INR essentially at goal. Will have patient take 7.5mg  today then continue usual dose. Take 7.5mg  today. On 10/31/14, continue coumadin 5mg  M/WF and 2.5mg  other days.    Recheck INR on 12/04/14 at 3:30pm for lab and 3:45 for coumadin clinic. Will evaluate INR and make coumadin dose change if needed at next visit if decreasing INR trend continues.

## 2014-11-07 ENCOUNTER — Other Ambulatory Visit: Payer: Self-pay | Admitting: Internal Medicine

## 2014-11-25 ENCOUNTER — Telehealth: Payer: Self-pay | Admitting: Oncology

## 2014-11-25 NOTE — Telephone Encounter (Signed)
per Chris/Phar-sch CC-pt aware

## 2014-11-27 ENCOUNTER — Other Ambulatory Visit (HOSPITAL_BASED_OUTPATIENT_CLINIC_OR_DEPARTMENT_OTHER): Payer: BLUE CROSS/BLUE SHIELD

## 2014-11-27 ENCOUNTER — Ambulatory Visit (HOSPITAL_BASED_OUTPATIENT_CLINIC_OR_DEPARTMENT_OTHER): Payer: Self-pay | Admitting: Pharmacist

## 2014-11-27 DIAGNOSIS — D6852 Prothrombin gene mutation: Secondary | ICD-10-CM

## 2014-11-27 DIAGNOSIS — D6859 Other primary thrombophilia: Secondary | ICD-10-CM

## 2014-11-27 DIAGNOSIS — Z7901 Long term (current) use of anticoagulants: Secondary | ICD-10-CM

## 2014-11-27 DIAGNOSIS — I2699 Other pulmonary embolism without acute cor pulmonale: Secondary | ICD-10-CM

## 2014-11-27 LAB — PROTIME-INR
INR: 2.2 (ref 2.00–3.50)
PROTIME: 26.4 s — AB (ref 10.6–13.4)

## 2014-11-27 LAB — POCT INR: INR: 2.2

## 2014-11-27 NOTE — Progress Notes (Signed)
INR at goal of 2-3 today.  No med changes.  No bleeding or bruising.  Antonio [name] asked about starting a ASA 81mg .  I told him to discuss with PCP and let him know the increased risk of bleeding/bruising on coumadin and asa.  Will continue current dose of coumadin of 5mg  MWF and 2.5mg  other days.  Will check PT/INR in 6 weeks.

## 2014-11-30 ENCOUNTER — Telehealth: Payer: Self-pay | Admitting: Oncology

## 2014-11-30 NOTE — Telephone Encounter (Signed)
per McCame[place]elissa in [place] to add CC-pt aware

## 2014-12-02 ENCOUNTER — Telehealth: Payer: Self-pay | Admitting: Oncology

## 2014-12-02 NOTE — Telephone Encounter (Signed)
pt cld to CX 3/18 appt-stated already had lab/CC-CX appt

## 2014-12-04 ENCOUNTER — Other Ambulatory Visit: Payer: Self-pay

## 2014-12-04 ENCOUNTER — Ambulatory Visit: Payer: Self-pay

## 2014-12-11 ENCOUNTER — Other Ambulatory Visit: Payer: Self-pay | Admitting: Internal Medicine

## 2014-12-15 ENCOUNTER — Telehealth: Payer: Self-pay | Admitting: Internal Medicine

## 2014-12-16 MED ORDER — PANTOPRAZOLE SODIUM 40 MG PO TBEC: 40.0000 mg | DELAYED_RELEASE_TABLET | Freq: Every day | ORAL | Status: DC

## 2014-12-16 NOTE — Telephone Encounter (Signed)
Patient is advised that he will need an office visit for refills on his protonix.  I will make sure he has enough refills until office visit on 02/08/15

## 2015-01-01 ENCOUNTER — Ambulatory Visit: Payer: BLUE CROSS/BLUE SHIELD | Admitting: Pharmacist

## 2015-01-01 ENCOUNTER — Emergency Department (HOSPITAL_COMMUNITY): Payer: BLUE CROSS/BLUE SHIELD

## 2015-01-01 ENCOUNTER — Encounter (HOSPITAL_COMMUNITY): Payer: Self-pay | Admitting: Emergency Medicine

## 2015-01-01 ENCOUNTER — Emergency Department (HOSPITAL_COMMUNITY)
Admission: EM | Admit: 2015-01-01 | Discharge: 2015-01-01 | Disposition: A | Discharge status: Home / Self Care | Payer: BLUE CROSS/BLUE SHIELD | Attending: Emergency Medicine | Admitting: Emergency Medicine

## 2015-01-01 DIAGNOSIS — Z862 Personal history of diseases of the blood and blood-forming organs and certain disorders involving the immune mechanism: Secondary | ICD-10-CM | POA: Insufficient documentation

## 2015-01-01 DIAGNOSIS — Z86718 Personal history of other venous thrombosis and embolism: Secondary | ICD-10-CM | POA: Insufficient documentation

## 2015-01-01 DIAGNOSIS — R002 Palpitations: Secondary | ICD-10-CM | POA: Diagnosis not present

## 2015-01-01 DIAGNOSIS — Z9842 Cataract extraction status, left eye: Secondary | ICD-10-CM | POA: Insufficient documentation

## 2015-01-01 DIAGNOSIS — R079 Chest pain, unspecified: Secondary | ICD-10-CM | POA: Diagnosis present

## 2015-01-01 DIAGNOSIS — Z7901 Long term (current) use of anticoagulants: Secondary | ICD-10-CM | POA: Diagnosis not present

## 2015-01-01 DIAGNOSIS — I2699 Other pulmonary embolism without acute cor pulmonale: Secondary | ICD-10-CM

## 2015-01-01 DIAGNOSIS — R0789 Other chest pain: Secondary | ICD-10-CM

## 2015-01-01 DIAGNOSIS — Z9841 Cataract extraction status, right eye: Secondary | ICD-10-CM | POA: Diagnosis not present

## 2015-01-01 DIAGNOSIS — F419 Anxiety disorder, unspecified: Secondary | ICD-10-CM | POA: Diagnosis not present

## 2015-01-01 DIAGNOSIS — J45909 Unspecified asthma, uncomplicated: Secondary | ICD-10-CM | POA: Diagnosis not present

## 2015-01-01 DIAGNOSIS — Z8739 Personal history of other diseases of the musculoskeletal system and connective tissue: Secondary | ICD-10-CM | POA: Diagnosis not present

## 2015-01-01 DIAGNOSIS — Z79899 Other long term (current) drug therapy: Secondary | ICD-10-CM | POA: Insufficient documentation

## 2015-01-01 DIAGNOSIS — K219 Gastro-esophageal reflux disease without esophagitis: Secondary | ICD-10-CM | POA: Diagnosis not present

## 2015-01-01 DIAGNOSIS — Z86711 Personal history of pulmonary embolism: Secondary | ICD-10-CM | POA: Insufficient documentation

## 2015-01-01 DIAGNOSIS — Q984 Klinefelter syndrome, unspecified: Secondary | ICD-10-CM | POA: Insufficient documentation

## 2015-01-01 DIAGNOSIS — D6859 Other primary thrombophilia: Secondary | ICD-10-CM

## 2015-01-01 LAB — CBC WITH DIFFERENTIAL/PLATELET
BASOS PCT: 0 % (ref 0–1)
Basophils Absolute: 0 10*3/uL (ref 0.0–0.1)
Eosinophils Absolute: 0.2 10*3/uL (ref 0.0–0.7)
Eosinophils Relative: 3 % (ref 0–5)
HCT: 45.3 % (ref 39.0–52.0)
HEMOGLOBIN: 16.1 g/dL (ref 13.0–17.0)
LYMPHS PCT: 35 % (ref 12–46)
Lymphs Abs: 1.6 10*3/uL (ref 0.7–4.0)
MCH: 30.8 pg (ref 26.0–34.0)
MCHC: 35.5 g/dL (ref 30.0–36.0)
MCV: 86.8 fL (ref 78.0–100.0)
MONO ABS: 0.4 10*3/uL (ref 0.1–1.0)
MONOS PCT: 9 % (ref 3–12)
NEUTROS ABS: 2.5 10*3/uL (ref 1.7–7.7)
Neutrophils Relative %: 53 % (ref 43–77)
Platelets: 200 10*3/uL (ref 150–400)
RBC: 5.22 MIL/uL (ref 4.22–5.81)
RDW: 13.3 % (ref 11.5–15.5)
WBC: 4.7 10*3/uL (ref 4.0–10.5)

## 2015-01-01 LAB — POCT INR: INR: 2.21

## 2015-01-01 LAB — COMPREHENSIVE METABOLIC PANEL
ALT: 29 U/L (ref 0–53)
AST: 26 U/L (ref 0–37)
Albumin: 4.2 g/dL (ref 3.5–5.2)
Alkaline Phosphatase: 44 U/L (ref 39–117)
Anion gap: 8 (ref 5–15)
BUN: 14 mg/dL (ref 6–23)
CO2: 28 mmol/L (ref 19–32)
Calcium: 9.2 mg/dL (ref 8.4–10.5)
Chloride: 103 mmol/L (ref 96–112)
Creatinine, Ser: 1.11 mg/dL (ref 0.50–1.35)
GFR calc Af Amer: 83 mL/min — ABNORMAL LOW (ref >90)
GFR calc non Af Amer: 71 mL/min — ABNORMAL LOW (ref >90)
Glucose, Bld: 101 mg/dL — ABNORMAL HIGH (ref 70–99)
Potassium: 4.2 mmol/L (ref 3.5–5.1)
Sodium: 139 mmol/L (ref 135–145)
Total Bilirubin: 1.5 mg/dL — ABNORMAL HIGH (ref 0.3–1.2)
Total Protein: 7.1 g/dL (ref 6.0–8.3)

## 2015-01-01 LAB — I-STAT TROPONIN, ED
Troponin i, poc: 0 ng/mL (ref 0.00–0.08)
Troponin i, poc: 0 ng/mL (ref 0.00–0.08)

## 2015-01-01 LAB — PROTIME-INR
INR: 2.21 — ABNORMAL HIGH (ref 0.00–1.49)
PROTHROMBIN TIME: 24.7 s — AB (ref 11.6–15.2)

## 2015-01-01 MED ORDER — ASPIRIN 325 MG PO TABS
325.0000 mg | ORAL_TABLET | Freq: Once | ORAL | Status: AC
  Administered 2015-01-01: 325 mg via ORAL
  Filled 2015-01-01: qty 1

## 2015-01-01 NOTE — ED Provider Notes (Signed)
CSN: [id number]     Arrival date & time 01/01/15  0736 History   First MD Initiated Contact with Patient 01/01/15 0740     Chief Complaint  Patient presents with  . Chest Pain     (Consider location/radiation/quality/duration/timing/severity/associated sxs/prior Treatment) The history is provided by the patient.  Antonio [name] is a 59 y.o. male hx of GERD, asthma, klinefelter syndrome, DVT and PE on coumadin, here presenting with palpitations, chest pressure. Patient has been having intermittent palpitations worse at night over the last 2-3 days. Symptoms associated with some chest pressure. Last night he has worse palpitations and constant left-sided chest pressure radiating to his neck. Patient has history of PE on some route to last year and has been on Coumadin since then and has no further PEs. Last INR was a month ago and was 2.2.    Past Medical History  Diagnosis Date  . GERD (gastroesophageal reflux disease)   . Asthma   . Klinefelter syndrome   . Hyperlipidemia   . Chronic headaches   . Eosinophilic esophagitis   . Seasonal allergies   . Cervical spine degeneration   . Cataract   . Allergy   . Clotting disorder    Past Surgical History  Procedure Laterality Date  . Cholecystectomy      ERCP for cbd stones  . Tonsillectomy    . Cataract surgery      bilateral  . Hand tendon surgery      left, work comp  . Ercp    . Colonoscopy    . Upper gastrointestinal endoscopy     Family History  Problem Relation Age of Onset  . Colon cancer Neg Hx   . Irritable bowel syndrome Sister   . Allergies Sister   . Allergies Mother    History  Substance Use Topics  . Smoking status: Passive Smoke Exposure - Never Smoker    Types: Cigarettes  . Smokeless tobacco: Never Used  . Alcohol Use: No    Review of Systems  Cardiovascular: Positive for chest pain.  All other systems reviewed and are negative.     Allergies  Codeine  Home Medications   Prior to  Admission medications   Medication Sig Start Date End Date Taking? Authorizing Provider  loratadine (CLARITIN) 10 MG tablet Take 10 mg by mouth daily.   Yes Historical Provider, MD  montelukast (SINGULAIR) 10 MG tablet TAKE 1 TABLET BY MOUTH ONCE DAILY 02/12/14  Yes Thao P Le, DO  Multiple Vitamins-Minerals (CENTRUM SILVER PO) Take 1 tablet by mouth daily. 02/24/13  Yes Historical Provider, MD  NONFORMULARY OR COMPOUNDED ITEM Apply 1 application topically daily. Testosterone Gel 10% Gel - Get's at [facility]   Yes Historical Provider, MD  OVER THE COUNTER MEDICATION OTC Mega Red Krill Oil 300 mg taking everyday   Yes Historical Provider, MD  OVER THE COUNTER MEDICATION Take 1 tablet by mouth daily. Move Freee Ultra (Boron, Hyaluronic acid, Typee II collagenase) 10/19/14  Yes Historical Provider, MD  pantoprazole (PROTONIX) 40 MG tablet Take 1 tablet (40 mg total) by mouth daily. 12/16/14  Yes Iva Boop, MD  PROAIR HFA 108 (90 BASE) MCG/ACT inhaler INHALE 1 TO 2 PUFFS BY MOUTH EVERY 4 TO 6 HOURS AS NEEDED Patient taking differently: INHALE 1 TO 2 PUFFS BY MOUTH EVERY 4 TO 6 HOURS AS NEEDED for shortness of breath and wheezing 01/16/14  Yes Thao P Le, DO  warfarin (COUMADIN) 5 MG tablet Take 2.5-5  mg by mouth daily. Takes 2.5mg  every day except 5mg  on Monday, Wednesday, and Friday   Yes Historical Provider, MD  Testosterone (ANDROGEL) 20.25 MG/1.25GM (1.62%) GEL Place 1.25 g onto the skin daily. Patient not taking: Reported on 01/01/2015 02/23/14   Romero BellingSean Ellison, MD  warfarin (COUMADIN) 5 MG tablet TAKE AS DIRECTED Patient not taking: Reported on 01/01/2015 09/15/14   Lowella DellGustav C Magrinat, MD   BP 106/70 mmHg  Pulse 76  Temp(Src) 98 F (36.7 C) (Oral)  Resp 18  SpO2 100% Physical Exam  Constitutional: He is oriented to person, place, and time. He appears well-developed and well-nourished.  Anxious   HENT:  Head: Normocephalic.  Mouth/Throat: Oropharynx is clear and moist.  Eyes: Conjunctivae are  normal. Pupils are equal, round, and reactive to light.  Neck: Normal range of motion.  Cardiovascular: Normal rate, regular rhythm and normal heart sounds.   Pulmonary/Chest: Effort normal and breath sounds normal. No respiratory distress. He has no wheezes. He has no rales.  Abdominal: Soft. Bowel sounds are normal. He exhibits no distension. There is no tenderness. There is no rebound and no guarding.  Musculoskeletal: Normal range of motion. He exhibits no edema or tenderness.  Neurological: He is alert and oriented to person, place, and time. No cranial nerve deficit. Coordination normal.  Skin: Skin is warm and dry.  Psychiatric: He has a normal mood and affect. His behavior is normal. Judgment and thought content normal.  Nursing note and vitals reviewed.   ED Course  Procedures (including critical care time) Labs Review Labs Reviewed  COMPREHENSIVE METABOLIC PANEL - Abnormal; Notable for the following:    Glucose, Bld 101 (*)    Total Bilirubin 1.5 (*)    GFR calc non Af Amer 71 (*)    GFR calc Af Amer 83 (*)    All other components within normal limits  PROTIME-INR - Abnormal; Notable for the following:    Prothrombin Time 24.7 (*)    INR 2.21 (*)    All other components within normal limits  CBC WITH DIFFERENTIAL/PLATELET  Rosezena SensorI-STAT TROPOININ, ED  Rosezena SensorI-STAT TROPOININ, ED    Imaging Review Dg Chest 2 View  01/01/2015   CLINICAL DATA:  Acute onset left-sided chest pain and tachycardia. Personal history of pulmonary embolism.  EXAM: CHEST  2 VIEW  COMPARISON:  None.  FINDINGS: The heart size and mediastinal contours are within normal limits. Both lungs are clear. The visualized skeletal structures are unremarkable.  IMPRESSION: No active cardiopulmonary disease.   Electronically Signed   By: Myles RosenthalJohn  Stahl M.D.   On: 01/01/2015 08:02     EKG Interpretation   Date/Time:  Friday January 01 2015 07:44:42 EDT Ventricular Rate:  80 PR Interval:  141 QRS Duration: 75 QT Interval:   356 QTC Calculation: 411 R Axis:   48 Text Interpretation:  Sinus rhythm No significant change since last  tracing Confirmed by Rossanna Spitzley  MD, Garlon Tuggle ([id number]) on 01/01/2015 7:46:57 AM      MDM   Final diagnoses:  None    Art BuffJames W Lick is a 59 y.o. male here with chest pain, palpitations. Not tachy or hypoxic currently. On coumadin so will check INR. If therapeutic, low suspicion for PE. Will need to r/o ACS with delta trop. I doubt dissection.   10:55 AM Delta trop neg. INR therapeutic. Reassured patient. Not tachy in the ED. Recommend outpatient stress test.    Richardean Canalavid H Rache Klimaszewski, MD 01/01/15 1056

## 2015-01-01 NOTE — ED Notes (Signed)
Per pt, states he has been having palpataions for a couple of days-states chest pressure this am

## 2015-01-01 NOTE — ED Notes (Signed)
Pt updated about status. He was made aware that all of his test results have just resulted and MD Silverio LayYao will be with him as soon as he reviews them.

## 2015-01-01 NOTE — Progress Notes (Signed)
[facility] saying he was there for eval of CP & neck pain.  Trop neg.  CBC wnl.  No bleeding. INR drawn in ED today--> 2.21 Tbili elevated (mild). Pt possibly "doubled up" on his Coumadin dose this past Wednesday.  He asks if this could cause internal bleeding.  I have a very low suspicion he has internal bleeding given he tells me his stools are normal in appearance and his CBC is normal today w/ INR = 2.21  No recent med changes.  He has not used NSAIDs recently. Current dose of Coumadin is 2.5 mg daily except 5 mg MWF.  He'll continue this dose w/o change. He was due to come this Mon (4/18) but we'll cancel that & see him in 1 month. NO CHARGE - phone encounter Ebony HailGinna Aerionna Moravek, Pharm.D., CPP 01/01/2015@10 :04 AM

## 2015-01-01 NOTE — Discharge Instructions (Signed)
Continue your current meds.   See your doctor. You may need stress test if you have more chest pain.   Return to ER if you have worse chest pain, shortness of breath, palpitations.

## 2015-01-04 ENCOUNTER — Ambulatory Visit: Payer: Self-pay

## 2015-01-04 ENCOUNTER — Other Ambulatory Visit: Payer: Self-pay

## 2015-01-07 ENCOUNTER — Other Ambulatory Visit: Payer: Self-pay | Admitting: Internal Medicine

## 2015-01-15 ENCOUNTER — Ambulatory Visit (INDEPENDENT_AMBULATORY_CARE_PROVIDER_SITE_OTHER): Payer: BLUE CROSS/BLUE SHIELD | Admitting: Family Medicine

## 2015-01-15 ENCOUNTER — Encounter: Payer: Self-pay | Admitting: Family Medicine

## 2015-01-15 VITALS — BP 122/78 | HR 88 | Temp 97.6°F | Resp 16 | Ht 67.5 in | Wt 177.2 lb

## 2015-01-15 DIAGNOSIS — Q984 Klinefelter syndrome, unspecified: Secondary | ICD-10-CM

## 2015-01-15 DIAGNOSIS — Z130 Encounter for screening for diseases of the blood and blood-forming organs and certain disorders involving the immune mechanism: Secondary | ICD-10-CM | POA: Diagnosis not present

## 2015-01-15 DIAGNOSIS — L0291 Cutaneous abscess, unspecified: Secondary | ICD-10-CM

## 2015-01-15 DIAGNOSIS — Z0000 Encounter for general adult medical examination without abnormal findings: Secondary | ICD-10-CM | POA: Diagnosis not present

## 2015-01-15 DIAGNOSIS — Z125 Encounter for screening for malignant neoplasm of prostate: Secondary | ICD-10-CM

## 2015-01-15 DIAGNOSIS — D688 Other specified coagulation defects: Secondary | ICD-10-CM

## 2015-01-15 DIAGNOSIS — Z87898 Personal history of other specified conditions: Secondary | ICD-10-CM | POA: Diagnosis not present

## 2015-01-15 DIAGNOSIS — L0390 Cellulitis, unspecified: Secondary | ICD-10-CM | POA: Diagnosis not present

## 2015-01-15 DIAGNOSIS — E785 Hyperlipidemia, unspecified: Secondary | ICD-10-CM

## 2015-01-15 DIAGNOSIS — D6851 Activated protein C resistance: Secondary | ICD-10-CM

## 2015-01-15 LAB — CBC WITH DIFFERENTIAL/PLATELET
Basophils Absolute: 0.1 10*3/uL (ref 0.0–0.1)
Basophils Relative: 1 % (ref 0–1)
Eosinophils Absolute: 0.2 10*3/uL (ref 0.0–0.7)
Eosinophils Relative: 3 % (ref 0–5)
HCT: 46.1 % (ref 39.0–52.0)
Hemoglobin: 16.5 g/dL (ref 13.0–17.0)
Lymphocytes Relative: 29 % (ref 12–46)
Lymphs Abs: 1.6 10*3/uL (ref 0.7–4.0)
MCH: 31.1 pg (ref 26.0–34.0)
MCHC: 35.8 g/dL (ref 30.0–36.0)
MCV: 86.8 fL (ref 78.0–100.0)
MPV: 8.6 fL (ref 8.6–12.4)
Monocytes Absolute: 0.6 10*3/uL (ref 0.1–1.0)
Monocytes Relative: 10 % (ref 3–12)
Neutro Abs: 3.2 10*3/uL (ref 1.7–7.7)
Neutrophils Relative %: 57 % (ref 43–77)
Platelets: 233 10*3/uL (ref 150–400)
RBC: 5.31 MIL/uL (ref 4.22–5.81)
RDW: 14 % (ref 11.5–15.5)
WBC: 5.6 10*3/uL (ref 4.0–10.5)

## 2015-01-15 LAB — COMPLETE METABOLIC PANEL WITH GFR
ALT: 27 U/L (ref 0–53)
Albumin: 4.5 g/dL (ref 3.5–5.2)
BUN: 13 mg/dL (ref 6–23)
CO2: 29 mEq/L (ref 19–32)
Calcium: 9.5 mg/dL (ref 8.4–10.5)
Chloride: 101 mEq/L (ref 96–112)
Creat: 1.09 mg/dL (ref 0.50–1.35)
GFR, Est African American: 86 mL/min
Glucose, Bld: 85 mg/dL (ref 70–99)
Potassium: 4.2 mEq/L (ref 3.5–5.3)
Sodium: 140 mEq/L (ref 135–145)
Total Bilirubin: 1.7 mg/dL — ABNORMAL HIGH (ref 0.2–1.2)

## 2015-01-15 LAB — COMPLETE METABOLIC PANEL WITHOUT GFR
AST: 24 U/L (ref 0–37)
Alkaline Phosphatase: 50 U/L (ref 39–117)
GFR, Est Non African American: 74 mL/min
Total Protein: 6.8 g/dL (ref 6.0–8.3)

## 2015-01-15 LAB — LIPID PANEL
Cholesterol: 227 mg/dL — ABNORMAL HIGH (ref 0–200)
HDL: 41 mg/dL (ref >=40)
LDL Cholesterol: 165 mg/dL — ABNORMAL HIGH (ref 0–99)
Total CHOL/HDL Ratio: 5.5 ratio
Triglycerides: 107 mg/dL (ref <150)
VLDL: 21 mg/dL (ref 0–40)

## 2015-01-15 LAB — TSH: TSH: 2.298 u[IU]/mL (ref 0.350–4.500)

## 2015-01-15 MED ORDER — MUPIROCIN 2 % EX OINT: TOPICAL_OINTMENT | CUTANEOUS | Status: DC

## 2015-01-15 NOTE — Progress Notes (Signed)
Chief Complaint:  Chief Complaint  Patient presents with  . Annual Exam    HPI: Antonio [name] is a 59 y.o. male who is here for: Annual visit last one was 2015. He is doing well overall. 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 Recently in ED for chest pain. Cardiac workup was negative serial enzymes. He was seen by a specialist at [facility] for genetic clotting disorder and has to be on Coumadin for the rest of his life. He is wearing compression stockings. He is up-to-date on his eye exam, colonoscopy. Please see note from 2015 annual visit below.   Antonio [name] is a 59 y.o. 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 He is followed by Dr Doris Cheadle but is scheduled to a specialist at [facility], he wants to look at [place] and if he can be off of coumadin, June 25th is his next appt He is getting labs regular last INR was 2.6 He has not eaten, No rectal bleeding and no blood in his urine.  Last colonscopy was normal, due for another one in 2018 Dr Carlean Purl  Testosterone injection is replaced with androgel, no sxs currently He has Klinefelter's Syndrome w/ hypogonadism/ low testosterone.    Ches pain in Ed on 01/01/15  Past Medical History  Diagnosis Date  . GERD (gastroesophageal reflux disease)   . Asthma   . Klinefelter syndrome   . Hyperlipidemia   . Chronic headaches   . Eosinophilic esophagitis   . Seasonal allergies   . Cervical spine degeneration   . Cataract   . Allergy   . Clotting disorder    Past Surgical History  Procedure Laterality Date  . Cholecystectomy      ERCP  for cbd stones  . Tonsillectomy    . Cataract surgery      bilateral  . Hand tendon surgery      left, work comp  . Ercp    . Colonoscopy    . Upper gastrointestinal endoscopy     History   Social History  . Marital Status: Married    Spouse Name: Caren Griffins  . Number of Children: 0  . 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 Smoking status: Passive Smoke Exposure - Never Smoker    Types: Cigarettes  . Smokeless tobacco: Never Used  . Alcohol Use: No  . Drug Use: No  . Sexual Activity:    Partners: Female   Other Topics Concern  . None   Social History Narrative   Lives with his wife. She has been diagnosed with COPD and continues to smoke.   Family History  Problem Relation Age of Onset  . Colon cancer Neg Hx   . Irritable bowel syndrome Sister   . Allergies Sister   . Allergies Mother    Allergies  Allergen Reactions  . Codeine Shortness Of Breath, Nausea Only and Other (See Comments)    REACTION: difficulty breathing, nausea  Prior to Admission medications   Medication Sig Start Date End Date Taking? Authorizing Provider  loratadine (CLARITIN) 10 MG tablet Take 10 mg by mouth daily.   Yes Historical Provider, MD  montelukast (SINGULAIR) 10 MG tablet TAKE 1 TABLET BY MOUTH ONCE DAILY 02/12/14  Yes Mariaha Ellington P Makeya Hilgert, DO  Multiple Vitamins-Minerals (CENTRUM SILVER PO) Take 1 tablet by mouth daily. 02/24/13  Yes Historical Provider, MD  NONFORMULARY OR COMPOUNDED ITEM Apply 1 application topically daily. Testosterone Gel 10% Gel - Get's at [facility]   Yes Historical Provider, MD  OVER THE COUNTER MEDICATION OTC Mega Red Krill Oil 300 mg taking everyday   Yes Historical Provider, MD  OVER THE COUNTER MEDICATION Take 1 tablet by mouth daily. Move Freee Ultra (Boron, Hyaluronic acid, Typee II collagenase) 10/19/14  Yes Historical Provider, MD  pantoprazole (PROTONIX) 40 MG tablet Take 1 tablet (40 mg total) by mouth  daily. 12/16/14  Yes Gatha Mayer, MD  PROAIR HFA 108 (90 BASE) MCG/ACT inhaler INHALE 1 TO 2 PUFFS BY MOUTH EVERY 4 TO 6 HOURS AS NEEDED Patient taking differently: INHALE 1 TO 2 PUFFS BY MOUTH EVERY 4 TO 6 HOURS AS NEEDED for shortness of breath and wheezing 01/16/14  Yes Ellamae Lybeck P Norvel Wenker, DO  warfarin (COUMADIN) 5 MG tablet TAKE AS DIRECTED 09/15/14  Yes Chauncey Cruel, MD  warfarin (COUMADIN) 5 MG tablet Take 2.5-5 mg by mouth daily. Takes 2.$RemoveBefore'5mg'[id number]$  every day except $RemoveBe'5mg'jEEeuBFrB$  on Monday, Wednesday, and Friday   Yes Historical Provider, MD  Testosterone (ANDROGEL) 20.25 MG/1.25GM (1.62%) GEL Place 1.25 g onto the skin daily. Patient not taking: Reported on 01/01/2015 02/23/14   Renato Shin, MD     ROS: The patient denies fevers, chills, night sweats, unintentional weight loss, chest pain, palpitations, wheezing, dyspnea on exertion, nausea, vomiting, abdominal pain, dysuria, hematuria, melena, numbness, weakness, or tingling.   All other systems have been reviewed and were otherwise negative with the exception of those mentioned in the HPI and as above.    PHYSICAL EXAM: Filed Vitals:   01/15/15 1008  BP: 122/78  Pulse: 88  Temp: 97.6 F (36.4 C)  Resp: 16   Filed Vitals:   01/15/15 1008  Height: 5' 7.5" (1.715 m)  Weight: 177 lb 3.2 oz (80.377 kg)   Body mass index is 27.33 kg/(m^2).  General: Alert, no acute distress HEENT:  Normocephalic, atraumatic, oropharynx patent. EOMI, PERRLA Funduscopic exam grossly normal, TM normal Cardiovascular:  Regular rate and rhythm, no rubs murmurs or gallops.  No Carotid bruits, radial pulse intact. No pedal edema.  Respiratory: Clear to auscultation bilaterally.  No wheezes, rales, or rhonchi.  No cyanosis, no use of accessory musculature GI: No organomegaly, abdomen is soft and non-tender, positive bowel sounds.  No masses. Skin: Positive localized cellulitis Neurologic: Facial musculature symmetric. Psychiatric: Patient is appropriate throughout our  interaction. Lymphatic: No cervical lymphadenopathy Musculoskeletal: Gait intact. Small testicles, descended, no rashes no discharge Prostate is within normal limits   LABS: Results for orders placed or performed in visit on 01/15/15  COMPLETE METABOLIC PANEL WITH GFR  Result Value Ref Range   Sodium 140 135 - 145 mEq/L   Potassium 4.2 3.5 - 5.3 mEq/L   Chloride 101 96 - 112 mEq/L   CO2 29 19 - 32 mEq/L   Glucose, Bld 85 70 - 99 mg/dL   BUN 13 6 - 23 mg/dL   Creat 1.09 0.50 - 1.35 mg/dL   Total Bilirubin 1.7 (H) 0.2 -  1.2 mg/dL   Alkaline Phosphatase 50 39 - 117 U/L   AST 24 0 - 37 U/L   ALT 27 0 - 53 U/L   Total Protein 6.8 6.0 - 8.3 g/dL   Albumin 4.5 3.5 - 5.2 g/dL   Calcium 9.5 8.4 - 10.5 mg/dL   GFR, Est African American 86 mL/min   GFR, Est Non African American 74 mL/min  CBC with Differential/Platelet  Result Value Ref Range   WBC 5.6 4.0 - 10.5 K/uL   RBC 5.31 4.22 - 5.81 MIL/uL   Hemoglobin 16.5 13.0 - 17.0 g/dL   HCT 46.1 39.0 - 52.0 %   MCV 86.8 78.0 - 100.0 fL   MCH 31.1 26.0 - 34.0 pg   MCHC 35.8 30.0 - 36.0 g/dL   RDW 14.0 11.5 - 15.5 %   Platelets 233 150 - 400 K/uL   MPV 8.6 8.6 - 12.4 fL   Neutrophils Relative % 57 43 - 77 %   Neutro Abs 3.2 1.7 - 7.7 K/uL   Lymphocytes Relative 29 12 - 46 %   Lymphs Abs 1.6 0.7 - 4.0 K/uL   Monocytes Relative 10 3 - 12 %   Monocytes Absolute 0.6 0.1 - 1.0 K/uL   Eosinophils Relative 3 0 - 5 %   Eosinophils Absolute 0.2 0.0 - 0.7 K/uL   Basophils Relative 1 0 - 1 %   Basophils Absolute 0.1 0.0 - 0.1 K/uL   Smear Review Criteria for review not met   Lipid panel  Result Value Ref Range   Cholesterol 227 (H) 0 - 200 mg/dL   Triglycerides 107 <150 mg/dL   HDL 41 >=40 mg/dL   Total CHOL/HDL Ratio 5.5 Ratio   VLDL 21 0 - 40 mg/dL   LDL Cholesterol 165 (H) 0 - 99 mg/dL  TSH  Result Value Ref Range   TSH 2.298 0.350 - 4.500 uIU/mL  PSA  Result Value Ref Range   PSA 1.00 <=4.00 ng/mL     EKG/XRAY:   Primary  read interpreted by Dr. Marin Comment at [facility].   ASSESSMENT/PLAN: Encounter Diagnoses  Name Primary?  . Annual physical exam Yes  . Screening for deficiency anemia   . Factor 5 Leiden mutation, heterozygous   . Klinefelter syndrome   . Hyperlipidemia   . Screening for prostate cancer   . Cellulitis and abscess   . History of chest pain    59 year old male with a past medical history of DVT, factor V Leiden mutation, Klinefelter, hyperlipidemia who is here for an annual physical exam. He was recently in the ER for chest pain workup. Annual labs pending He will get his colonoscopy as scheduled with Dr. Carlean Purl. I've advised him to try warm compresses on the small area of cellulitis/abscess on his back and Bactroban He has been referred to cardiology  Gross sideeffects, risk and benefits, and alternatives of medications d/w patient. Patient is aware that all medications have potential sideeffects and we are unable to predict every sideeffect or drug-drug interaction that may occur.  Syleena Mchan, [place], DO 01/19/2015 10:32 AM

## 2015-01-16 LAB — PSA: PSA: 1 ng/mL (ref <=4.00)

## 2015-01-18 ENCOUNTER — Telehealth: Payer: Self-pay | Admitting: Internal Medicine

## 2015-01-18 ENCOUNTER — Other Ambulatory Visit: Payer: Self-pay | Admitting: Internal Medicine

## 2015-01-18 NOTE — Telephone Encounter (Signed)
Refill sent in and patient informed.

## 2015-01-27 ENCOUNTER — Telehealth: Payer: Self-pay | Admitting: Oncology

## 2015-01-27 NOTE — Telephone Encounter (Signed)
Confirm 05/20 appointment,

## 2015-01-29 ENCOUNTER — Other Ambulatory Visit: Payer: Self-pay

## 2015-01-29 ENCOUNTER — Ambulatory Visit: Payer: Self-pay

## 2015-02-05 ENCOUNTER — Other Ambulatory Visit (HOSPITAL_BASED_OUTPATIENT_CLINIC_OR_DEPARTMENT_OTHER): Payer: BLUE CROSS/BLUE SHIELD

## 2015-02-05 ENCOUNTER — Ambulatory Visit (HOSPITAL_BASED_OUTPATIENT_CLINIC_OR_DEPARTMENT_OTHER): Payer: Self-pay | Admitting: Pharmacist

## 2015-02-05 DIAGNOSIS — D6852 Prothrombin gene mutation: Secondary | ICD-10-CM

## 2015-02-05 DIAGNOSIS — I2699 Other pulmonary embolism without acute cor pulmonale: Secondary | ICD-10-CM

## 2015-02-05 DIAGNOSIS — I82409 Acute embolism and thrombosis of unspecified deep veins of unspecified lower extremity: Secondary | ICD-10-CM

## 2015-02-05 DIAGNOSIS — Z7901 Long term (current) use of anticoagulants: Secondary | ICD-10-CM

## 2015-02-05 DIAGNOSIS — D6859 Other primary thrombophilia: Secondary | ICD-10-CM

## 2015-02-05 LAB — POCT INR: INR: 2.4

## 2015-02-05 LAB — PROTIME-INR
INR: 2.4 (ref 2.00–3.50)
Protime: 28.8 Seconds — ABNORMAL HIGH (ref 10.6–13.4)

## 2015-02-05 NOTE — Patient Instructions (Signed)
INR at goal today No changes Continue coumadin 5mg  M/WF and 2.5mg  other days.   Recheck INR on 03/19/15 at 3:30pm for lab and 3:45 for coumadin clinic.

## 2015-02-05 NOTE — Progress Notes (Signed)
INR at goal today at 2.4 (goal 2-3) Pt is doing well with no complaints He is enjoying is current job at [employer]  No unusual bleeding or bruising No diet or medication changes No missed or extra doses Antonio [name] states he may stop protonix after GI appointment next week on 5/23 - he will let us know of any changes Plan: No changes Continue coumadin 5mg  M/WF and 2.5mg  other days.    Recheck INR on 03/19/15 at 3:30pm for lab and 3:45 for coumadin clinic.

## 2015-02-08 ENCOUNTER — Other Ambulatory Visit: Payer: BLUE CROSS/BLUE SHIELD

## 2015-02-08 ENCOUNTER — Ambulatory Visit (INDEPENDENT_AMBULATORY_CARE_PROVIDER_SITE_OTHER): Payer: BLUE CROSS/BLUE SHIELD | Admitting: Internal Medicine

## 2015-02-08 ENCOUNTER — Encounter: Payer: Self-pay | Admitting: Internal Medicine

## 2015-02-08 VITALS — BP 102/58 | HR 76 | Ht 67.5 in | Wt 175.2 lb

## 2015-02-08 DIAGNOSIS — K200 Eosinophilic esophagitis: Secondary | ICD-10-CM | POA: Diagnosis not present

## 2015-02-08 DIAGNOSIS — K219 Gastro-esophageal reflux disease without esophagitis: Secondary | ICD-10-CM | POA: Diagnosis not present

## 2015-02-08 MED ORDER — MONTELUKAST SODIUM 10 MG PO TABS: 10.0000 mg | ORAL_TABLET | Freq: Every day | ORAL | Status: DC

## 2015-02-08 MED ORDER — PANTOPRAZOLE SODIUM 40 MG PO TBEC: 40.0000 mg | DELAYED_RELEASE_TABLET | Freq: Every day | ORAL | Status: DC

## 2015-02-08 NOTE — Assessment & Plan Note (Signed)
Doing well  Continue PPI and monteleukast Need for PPI and possible risks discussed

## 2015-02-08 NOTE — Patient Instructions (Signed)
We have sent the following medications to your pharmacy for you to pick up at your convenience: Pantoprazole Singular  Your physician has requested that you go to the basement for the lab work before leaving today.   Follow up in a year with Dr. Leone PayorGessner.   I appreciate the opportunity to care for you. Stan Headarl Gessner, M.D. , [id number]

## 2015-02-08 NOTE — Assessment & Plan Note (Signed)
Continue PPI ?

## 2015-02-09 NOTE — Progress Notes (Signed)
Quick Note:  Can they tell us why indirect bili value "not calculated"? ______

## 2015-02-10 ENCOUNTER — Encounter: Payer: Self-pay | Admitting: Internal Medicine

## 2015-02-10 NOTE — Progress Notes (Signed)
   Subjective:    Patient ID: Art BuffJames W Markwardt, male    DOB: 1955-10-14, 59 y.o.   MRN: [id number] Cc; f/u GERD and Eosinophilc gastroenteritis HPI Here for f/u. No dysphagia and no heartburn. Needs monteleukast and pantoprazole refilled. Has gained some abdominal girth/weight. 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 Has had DVT problems, failed Xarelto and got PE so on warfarin. Has to wear compression hose.  Medications, allergies, past medical history, past surgical history, family history and social history are reviewed and updated in the EMR.  Review of Systems As above    Objective:   Physical Exam BP 102/58 mmHg  Pulse 76  Ht 5' 7.5" (1.715 m)  Wt 175 lb 4 oz (79.493 kg)  BMI 27.03 kg/m2 NAD     Assessment & Plan:  EOSINOPHILIC ESOPHAGITIS Doing well  Continue PPI and monteleukast Need for PPI and possible risks discussed   GERD Continue PPI    RTC 1 year sooner prn

## 2015-02-12 LAB — BILIRUBIN,DIRECT & INDIRECT (FRACTIONATED)
Bilirubin, Direct: 0.2 mg/dL (ref 0.0–0.3)
Indirect Bilirubin: 0.9 mg/dL (ref 0.2–1.2)

## 2015-02-12 LAB — BILIRUBIN, TOTAL: BILIRUBIN TOTAL: 1.1 mg/dL (ref 0.2–1.2)

## 2015-02-17 NOTE — Progress Notes (Signed)
Quick Note:  Labs c/w Gilbert's ______

## 2015-02-20 IMAGING — US US EXTREM LOW VENOUS*R*
1 series · 14 of 24 positions shown · non-contrast
Comparison: None.

***ADDENDUM*** CREATED: 02/12/2013 [DATE]

Compared to the ultrasound from the [REDACTED] on [DATE] [DATE],
there is no significant change.  No evidence of proximal
propagation of the femoral vein clot.
***END ADDENDUM*** SIGNED BY: Tougma Warman, M.D.
CLINICAL DATA: Positive DVT at [HOSPITAL].
Right LOWER EXTREMITY VENOUS DUPLEX ULTRASOUND
TECHNIQUE: Gray-scale sonography with graded compression, as well
as color Doppler and duplex ultrasound were performed to evaluate
the deep venous system of the lower extremity from the level of the
common femoral vein through the popliteal and proximal calf veins.
Spectral Doppler was utilized to evaluate flow at rest and with
distal augmentation maneuvers.

[Series 1: us extrem low venous*right* · 14 of 26 slices shown]
[im 1/26]
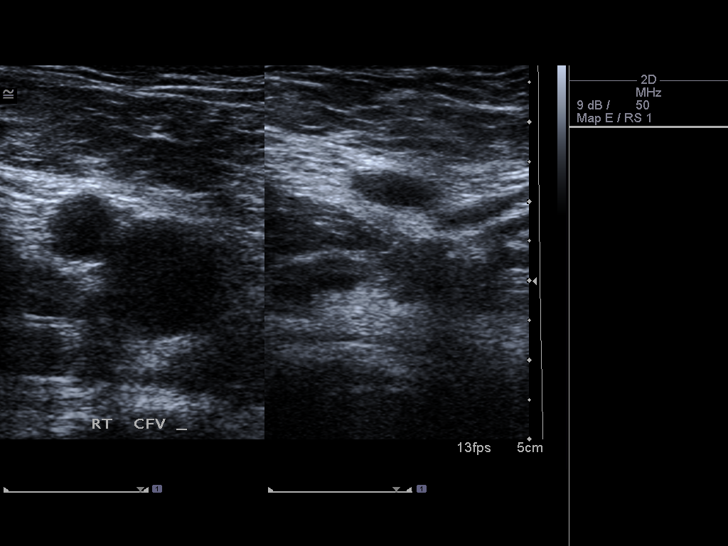
[im 3/26]
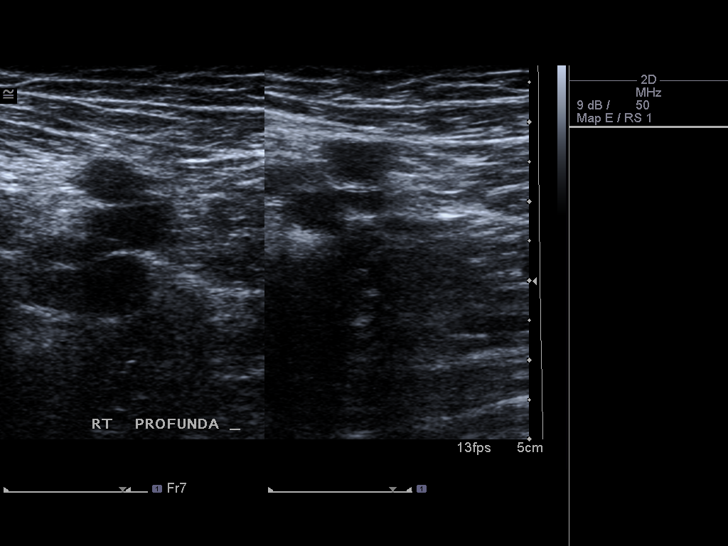
[im 5/26]
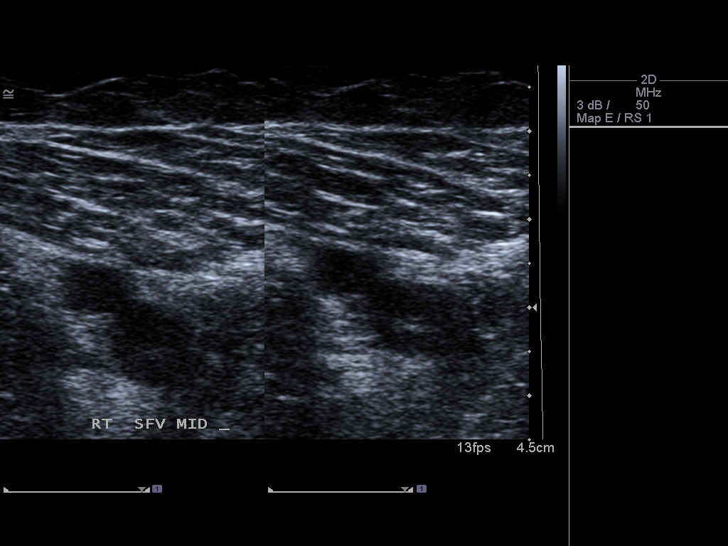
[im 7/26]
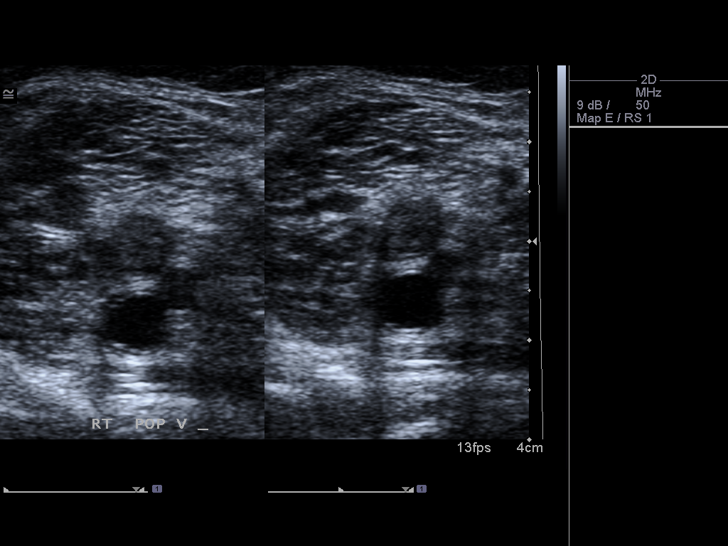
[im 8/26]
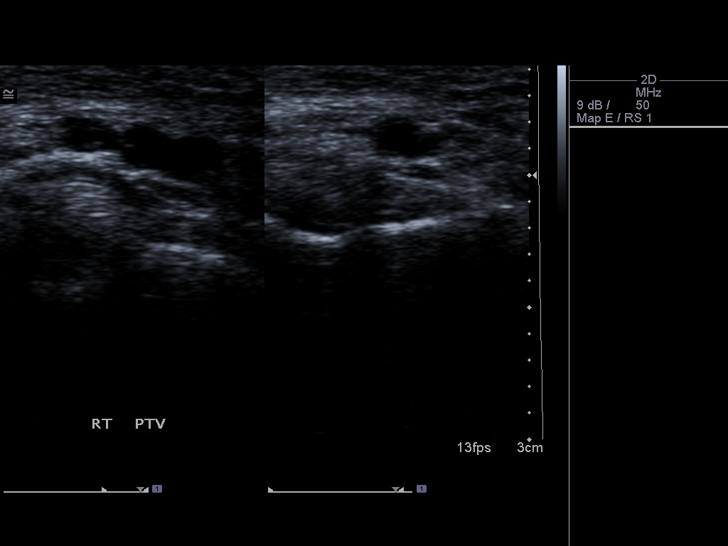
[im 10/26]
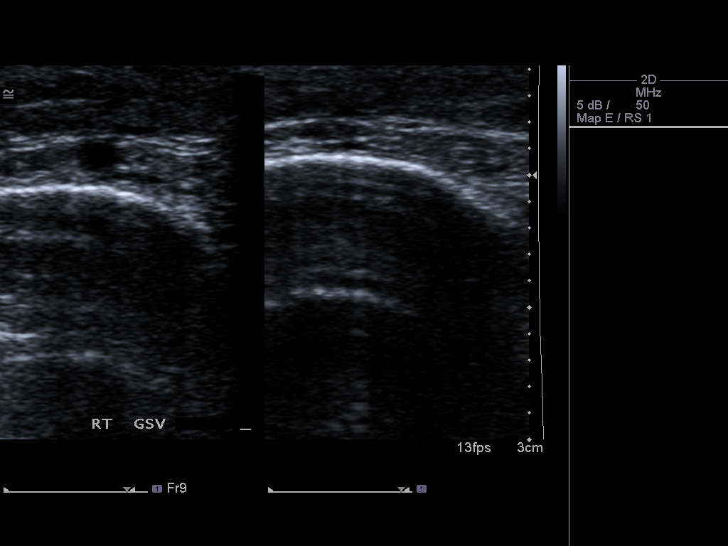
[im 12/26]
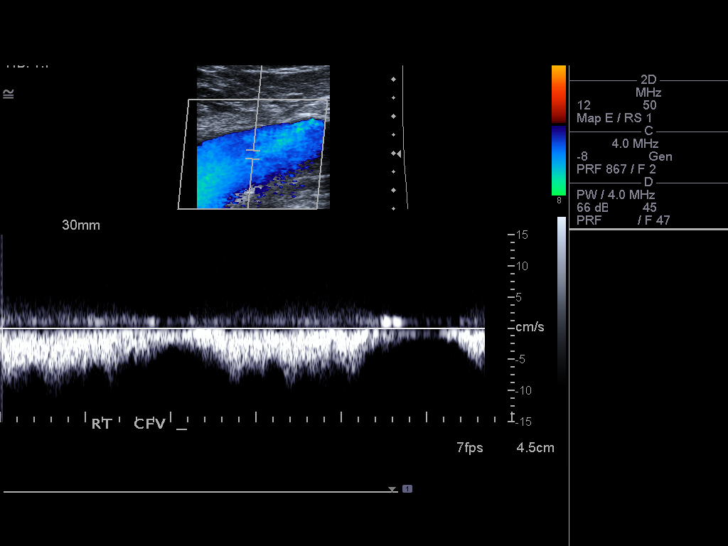
[im 14/26]
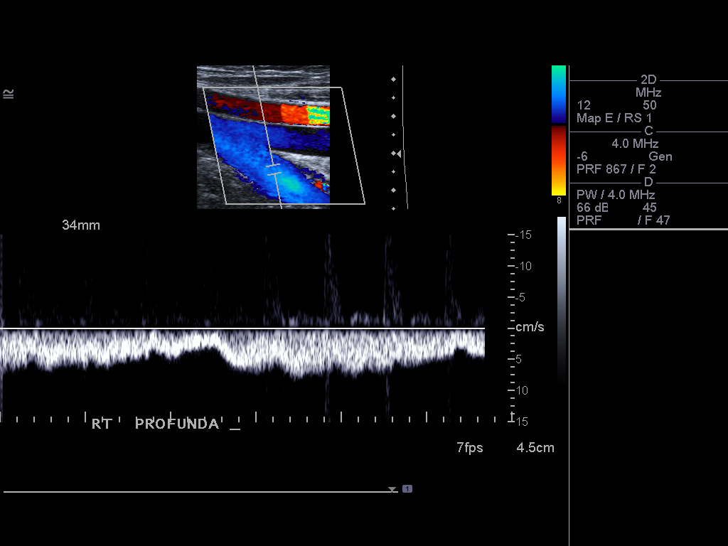
[im 16/26]
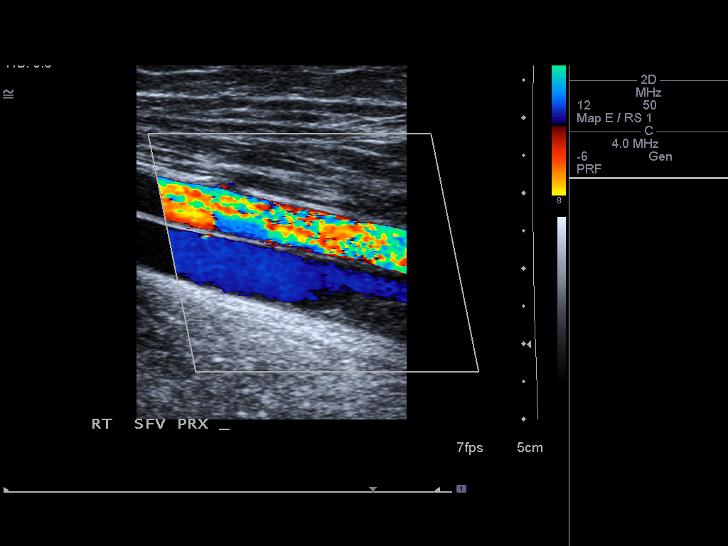
[im 18/26]
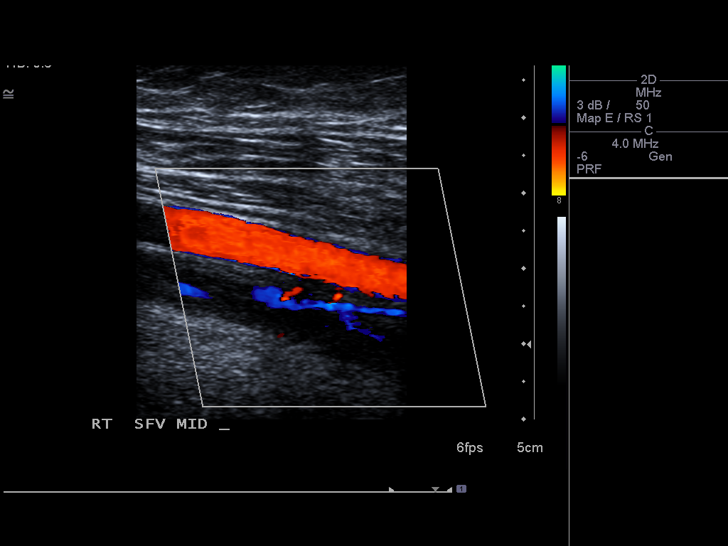
[im 20/26]
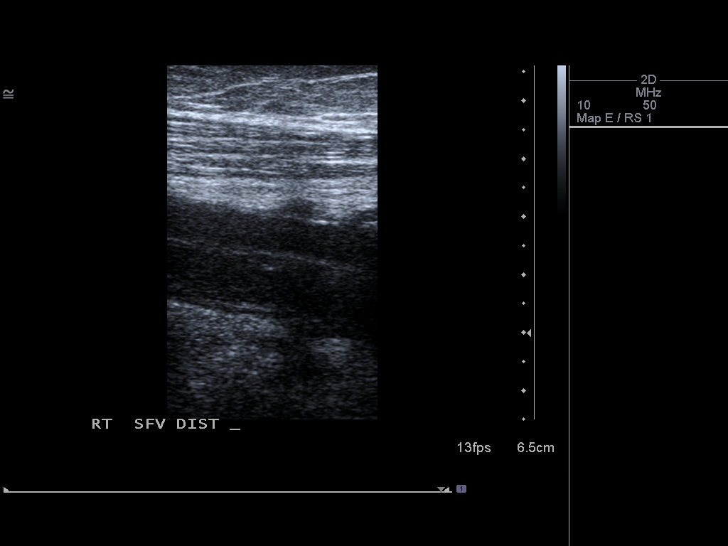
[im 21/26]
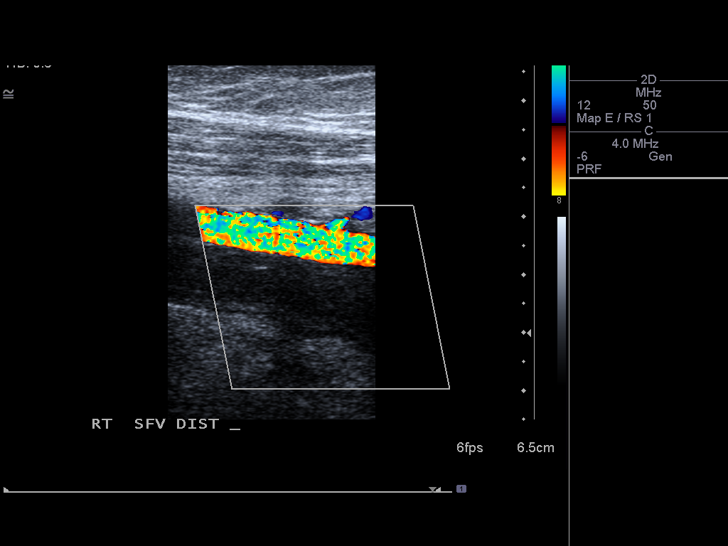
[im 23/26]
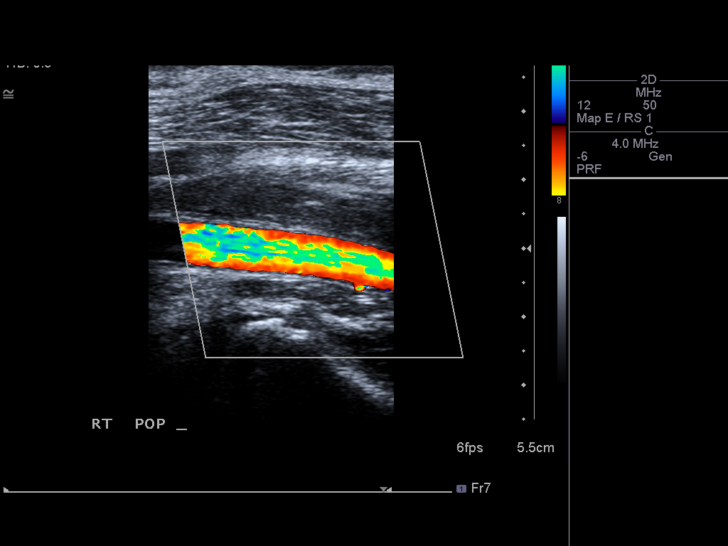
[im 26/26]
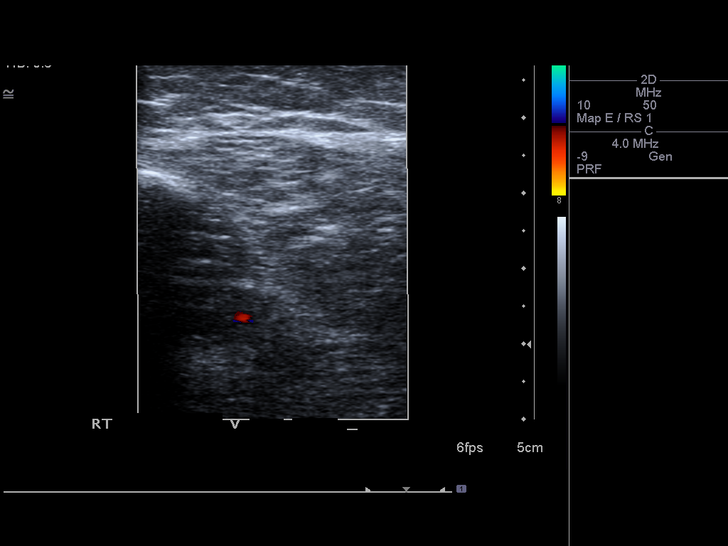

[14 of 24 positions shown; findings below may reference images not displayed]

FINDINGS: Normal compressibility of the common femoral and
profunda femoral vein.

Beginning in the femoral vein there is noncompressibility in the
proximal aspect as well as mid distal aspect.  There is occlusive
thrombus within the mid and distal superficial femoral vein.  This
extends into the posterior tibial and peroneal veins.
IMPRESSION: Deep venous thrombosis within the right femoral vein extending into
the calf veins.

## 2015-02-23 ENCOUNTER — Telehealth: Payer: Self-pay | Admitting: *Deleted

## 2015-02-23 ENCOUNTER — Ambulatory Visit (INDEPENDENT_AMBULATORY_CARE_PROVIDER_SITE_OTHER): Payer: BLUE CROSS/BLUE SHIELD | Admitting: Internal Medicine

## 2015-02-23 VITALS — BP 120/70 | HR 74 | Temp 98.0°F | Ht 67.5 in | Wt 176.2 lb

## 2015-02-23 DIAGNOSIS — D6851 Activated protein C resistance: Secondary | ICD-10-CM

## 2015-02-23 DIAGNOSIS — R42 Dizziness and giddiness: Secondary | ICD-10-CM | POA: Diagnosis not present

## 2015-02-23 DIAGNOSIS — I2699 Other pulmonary embolism without acute cor pulmonale: Secondary | ICD-10-CM | POA: Diagnosis not present

## 2015-02-23 DIAGNOSIS — D688 Other specified coagulation defects: Secondary | ICD-10-CM

## 2015-02-23 DIAGNOSIS — M25561 Pain in right knee: Secondary | ICD-10-CM | POA: Diagnosis not present

## 2015-02-23 LAB — POCT CBC
Granulocyte percent: 65.9 %G (ref 37–80)
HEMATOCRIT: 44.6 % (ref 43.5–53.7)
Hemoglobin: 15.3 g/dL (ref 14.1–18.1)
Lymph, poc: 2.2 (ref 0.6–3.4)
MCH: 29.7 pg (ref 27–31.2)
MCHC: 34.3 g/dL (ref 31.8–35.4)
MCV: 86.5 fL (ref 80–97)
MID (cbc): 0.3 (ref 0–0.9)
MPV: 6.5 fL (ref 0–99.8)
PLATELET COUNT, POC: 230 10*3/uL (ref 142–424)
POC GRANULOCYTE: 4.9 (ref 2–6.9)
POC LYMPH PERCENT: 29.5 %L (ref 10–50)
POC MID %: 4.6 %M (ref 0–12)
RBC: 5.16 M/uL (ref 4.69–6.13)
RDW, POC: 12.7 %
WBC: 7.4 10*3/uL (ref 4.6–10.2)

## 2015-02-23 NOTE — Addendum Note (Signed)
Addended by: Tonye PearsonOLITTLE, ROBERT P on: 02/23/2015 09:29 PM   Modules accepted: Medications, Level of Service

## 2015-02-23 NOTE — Progress Notes (Addendum)
Subjective:    Patient ID: Antonio [name], male    DOB: January 03, 1956, 59 y.o.   MRN: [id number] This chart was scribed for Ellamae Siaobert Xion Debruyne, MD by Jolene Provostobert Halas, Medical Scribe. This patient was seen in Room 4 and the patient's care was started a 7:44 PM.  Chief Complaint  Patient presents with  . Dizziness    C/O lightheaded x 2 days (getting worse)    HPI HPI Comments: Antonio BuffJames W Neidhardt is a 59 y.o. male who presents to [facility] complaining of dizziness that started two days ago with associated nausea and tinnitus. Pt states his sx have not disturbed his sleep. Pt has not vomited. His appetite is okay. There has been no fever. No hearing loss. No increase in allergy or sinus symptoms. No palpitations or chest pain.  Pt also states that he has continued pain in his right leg, which is present today even though he's been wearing his compression stocking. See history of hypercoagulability-factor V with pulmonary embolus and DVT. He remains on warfarin and most recent INR was between 2 and 3. Pt states his right leg is always swollen, and has been for the last two years since he had a blood clot.  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 Pt states he has gained 12 pounds due to his less active job.  Pt states he had a panic attack on 01/01/2015 and reported to the ER due to chest pain and palpitations. The evaluation was within normal limits. He does have cardiology follow-up in 3-4 weeks.  Patient Active Problem List   Diagnosis Date Noted  . Pulmonary embolism 08/05/2013  . Primary hypercoagulable state 02/20/2013  . Acute and chronic pulmonary embolism in a patient with Factor V Leiden 02/19/2013  . DVT (deep venous thrombosis) 02/14/2013  . Klinefelter's syndrome 10/04/2012  . Cervical spine degeneration   . Lightheadedness 07/09/2012  . FATIGUE 12/14/2008  . Intermittent asthma 08/12/2008  . GERD 08/12/2008    . EOSINOPHILIC ESOPHAGITIS 06/03/2008  . ALLERGY 06/03/2008    Current outpatient prescriptions:  .  loratadine (CLARITIN) 10 MG tablet, Take 10 mg by mouth daily., Disp: , Rfl:  .  montelukast (SINGULAIR) 10 MG tablet, Take 1 tablet (10 mg total) by mouth daily., Disp: 90 tablet, Rfl: 3 .  Multiple Vitamins-Minerals (CENTRUM SILVER PO), Take 1 tablet by mouth daily., Disp: , Rfl:  .  NONFORMULARY OR COMPOUNDED ITEM, Apply 1 application topically daily. Testosterone Gel 10% Gel - Get's at [facility], Disp: , Rfl:  .  OVER THE COUNTER MEDICATION, OTC Mega Red Krill Oil 300 mg taking everyday, Disp: , Rfl:  .  OVER THE COUNTER MEDICATION, Take 1 tablet by mouth daily. Move Free Ultra (Boron, Hyaluronic acid, Typee II collagenase), Disp: , Rfl:  .  pantoprazole (PROTONIX) 40 MG tablet, Take 1 tablet (40 mg total) by mouth daily., Disp: 90 tablet, Rfl: 3 .  PROAIR HFA 108 (90 BASE) MCG/ACT inhaler, INHALE 1 TO 2 PUFFS BY MOUTH EVERY 4 TO 6 HOURS AS NEEDED (Patient taking differently: INHALE 1 TO 2 PUFFS BY MOUTH EVERY 4 TO 6 HOURS AS NEEDED for shortness of breath and wheezing), Disp: 8.5 each, Rfl: 11 .  warfarin (COUMADIN) 5 MG tablet, TAKE AS DIRECTED, Disp: 30 tablet, Rfl: PRN   Review of Systems  Constitutional: Negative for fever and chills.  HENT: Positive for tinnitus.  Cardiovascular: Positive for leg swelling. Negative for chest pain and palpitations.  Gastrointestinal: Positive for nausea. Negative for vomiting.  Neurological: Positive for dizziness.  Psychiatric/Behavioral: Negative for sleep disturbance.       Objective:   Physical Exam  Constitutional: He is oriented to person, place, and time. He appears well-developed and well-nourished. No distress.  HENT:  Head: Normocephalic and atraumatic.  Right Ear: External ear normal.  Left Ear: External ear normal.  Nose: Nose normal.  Mouth/Throat: Oropharynx is clear and moist. No oropharyngeal exudate.  Eyes:  Conjunctivae and EOM are normal. Pupils are equal, round, and reactive to light.  Neck: Normal range of motion. Neck supple. No thyromegaly present.  Cardiovascular: Normal rate, regular rhythm, normal heart sounds and intact distal pulses.   No murmur heard. Pulmonary/Chest: Effort normal and breath sounds normal. No respiratory distress. He has no wheezes.  Musculoskeletal: Normal range of motion.  The right leg has mild tenderness throughout the posterior thigh and posterior calf with no defect palpable cord and only minimal swelling compared to the left.  Lymphadenopathy:    He has no cervical adenopathy.  Neurological: He is alert and oriented to person, place, and time. He has normal reflexes. No cranial nerve deficit. Coordination normal.  Romberg negative/finger to nose stable/no nystagmus with position change/cannot reproduce dizziness/single leg stance intact  Skin: Skin is warm and dry. He is not diaphoretic.  Psychiatric: He has a normal mood and affect. His behavior is normal.  Nursing note and vitals reviewed. BP 120/70 mmHg  Pulse 74  Temp(Src) 98 F (36.7 C) (Oral)  Ht 5' 7.5" (1.715 m)  Wt 176 lb 4 oz (79.946 kg)  BMI 27.18 kg/m2  SpO2 98%      Assessment & Plan:  Dizziness and giddiness -  mild nausea --Because of such a recent onset with mild symptoms this will be followed without treatment --Probable early viral illness versus BPV --Follow-up at once if worse, in one week if not well  Hypercoagulable--- risk controlled by Coumadin Leg pain--consistent with past history     I have completed the patient encounter in its entirety as documented by the scribe, with editing by me where necessary. Mandel Seiden P. Merla Riches, M.D.

## 2015-02-23 NOTE — Telephone Encounter (Signed)
Thayer Ohmhris, do we substitute 1 for 1 on this?

## 2015-02-23 NOTE — Telephone Encounter (Signed)
[facility] with request to substitute Jantoven for Warfarin as [facility] pharmacy does not carry Warfarin any longer. This needs MD approval

## 2015-02-24 ENCOUNTER — Encounter: Payer: Self-pay | Admitting: Pharmacist

## 2015-02-24 ENCOUNTER — Other Ambulatory Visit: Payer: Self-pay | Admitting: *Deleted

## 2015-02-24 ENCOUNTER — Telehealth: Payer: Self-pay | Admitting: Oncology

## 2015-02-24 LAB — COMPREHENSIVE METABOLIC PANEL
ALT: 24 U/L (ref 0–53)
AST: 21 U/L (ref 0–37)
Albumin: 4.1 g/dL (ref 3.5–5.2)
Alkaline Phosphatase: 46 U/L (ref 39–117)
BUN: 15 mg/dL (ref 6–23)
CALCIUM: 8.8 mg/dL (ref 8.4–10.5)
CHLORIDE: 106 meq/L (ref 96–112)
CO2: 26 mEq/L (ref 19–32)
CREATININE: 1.1 mg/dL (ref 0.50–1.35)
GLUCOSE: 93 mg/dL (ref 70–99)
Potassium: 3.9 mEq/L (ref 3.5–5.3)
Sodium: 141 mEq/L (ref 135–145)
Total Bilirubin: 1 mg/dL (ref 0.2–1.2)
Total Protein: 6.4 g/dL (ref 6.0–8.3)

## 2015-02-24 LAB — LIPID PANEL
Cholesterol: 223 mg/dL — ABNORMAL HIGH (ref 0–200)
HDL: 32 mg/dL — ABNORMAL LOW (ref >=40)
LDL Cholesterol: 132 mg/dL — ABNORMAL HIGH (ref 0–99)
TRIGLYCERIDES: 294 mg/dL — AB (ref <150)
Total CHOL/HDL Ratio: 7 Ratio
VLDL: 59 mg/dL — ABNORMAL HIGH (ref 0–40)

## 2015-02-24 NOTE — Telephone Encounter (Signed)
Refill given for new generic.  Per Thayer Ohmhris in pharmacy- need to recheck INR next week due to change in generic form of coumadin.  POF placed and message left on pt's identified VM.

## 2015-02-24 NOTE — Telephone Encounter (Signed)
Left message to confirm appointment for for June.

## 2015-02-24 NOTE — Progress Notes (Signed)
Antonio [name] called and will be changing the pharmacy he uses to fill his prescriptions.  He will not be starting Jantoven, that [facility] will be switching from warfain to, but will continue warfarin.  I will cancel his coumadin clinic appt on 03/02/15 and will see him as scheduled on 03/19/15.

## 2015-03-02 ENCOUNTER — Ambulatory Visit: Payer: Self-pay

## 2015-03-02 ENCOUNTER — Other Ambulatory Visit: Payer: Self-pay

## 2015-03-11 ENCOUNTER — Other Ambulatory Visit: Payer: Self-pay | Admitting: Family Medicine

## 2015-03-19 ENCOUNTER — Other Ambulatory Visit (HOSPITAL_BASED_OUTPATIENT_CLINIC_OR_DEPARTMENT_OTHER): Payer: BLUE CROSS/BLUE SHIELD

## 2015-03-19 ENCOUNTER — Ambulatory Visit (HOSPITAL_BASED_OUTPATIENT_CLINIC_OR_DEPARTMENT_OTHER): Payer: BLUE CROSS/BLUE SHIELD

## 2015-03-19 DIAGNOSIS — D6859 Other primary thrombophilia: Secondary | ICD-10-CM

## 2015-03-19 DIAGNOSIS — D6852 Prothrombin gene mutation: Secondary | ICD-10-CM | POA: Diagnosis not present

## 2015-03-19 DIAGNOSIS — Z7901 Long term (current) use of anticoagulants: Secondary | ICD-10-CM

## 2015-03-19 DIAGNOSIS — I82409 Acute embolism and thrombosis of unspecified deep veins of unspecified lower extremity: Secondary | ICD-10-CM

## 2015-03-19 DIAGNOSIS — I2699 Other pulmonary embolism without acute cor pulmonale: Secondary | ICD-10-CM

## 2015-03-19 LAB — POCT INR: INR: 2.4

## 2015-03-19 LAB — PROTIME-INR
INR: 2.4 (ref 2.00–3.50)
PROTIME: 28.8 s — AB (ref 10.6–13.4)

## 2015-03-19 MED ORDER — WARFARIN SODIUM 5 MG PO TABS: 5.0000 mg | ORAL_TABLET | ORAL | Status: DC

## 2015-03-19 NOTE — Progress Notes (Signed)
Mr. Kelton PillarMichael's INR is 2.4 today which is within his goal range of 2-3. He has been taking 5mg  M/WF and 2.5mg  other days. He reports no missed and/or extra doses. No dietary and/or medication changes. No bleeding and/or unusual bruising noted.  He also states that his previous pharmacy, [facility], informed him that they will no longer carry generic warfarin and that they are substituting the product for Jantoven. As different warfarin products can cause variations in the INR, Mr. Casimiro NeedleMichael would like to remain on his current warfarin product, and will be switching pharmacies to [facility]. I called [facility] to find out his previous warfarin manufacturer Nils Flack(Taro) and informed them to close out that prescription. I e-scribed a new prescription for warfarin (with a note to the pharmacy to continue the [employer] manufacturer) to [facility].  Plan: Continue Coumadin 5mg  M/WF and 2.5mg  other days. Return to Coumadin Clinic 04/23/15: 3:30 pm for lab, 3:45 for Coumadin Clinic

## 2015-04-02 ENCOUNTER — Ambulatory Visit (INDEPENDENT_AMBULATORY_CARE_PROVIDER_SITE_OTHER): Payer: BLUE CROSS/BLUE SHIELD | Admitting: Cardiology

## 2015-04-02 ENCOUNTER — Encounter: Payer: Self-pay | Admitting: Cardiology

## 2015-04-02 VITALS — BP 98/58 | HR 75 | Ht 68.0 in | Wt 178.8 lb

## 2015-04-02 DIAGNOSIS — K219 Gastro-esophageal reflux disease without esophagitis: Secondary | ICD-10-CM | POA: Diagnosis not present

## 2015-04-02 DIAGNOSIS — I2699 Other pulmonary embolism without acute cor pulmonale: Secondary | ICD-10-CM | POA: Diagnosis not present

## 2015-04-02 DIAGNOSIS — E785 Hyperlipidemia, unspecified: Secondary | ICD-10-CM

## 2015-04-02 DIAGNOSIS — R079 Chest pain, unspecified: Secondary | ICD-10-CM

## 2015-04-02 NOTE — Progress Notes (Signed)
Cardiology Office Note   Date:  04/02/2015   ID:  Cregg, Jutte 1956/06/04, MRN [id number]  PCP:  Tonye Pearson, MD    No chief complaint on file.     History of Present Illness: Antonio [name] is a 59 y.o. male who presents for evaluation of chest pain.  He has a history of PE in the past secondary to hypercoagulable state with Factor V Leiden deficiency as well as DVT and is on chronic coumadin.  He also has a history of GERD with eosinophilic esophagitis and is con chronic PPI therapy.  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  He had an episode Chest pain recently that he thought was related to a panic attack from stress and anxiety.  He was seen in the ER.  His troponin was negative and EKG nonischemic. He is now referred for outpt stress testing.  He has not had any further episodes of chest pain.  He also has had some problems with palpitations that are intermittent and associated with CP.      Past Medical History  Diagnosis Date  . GERD (gastroesophageal reflux disease)   . Asthma   . Klinefelter syndrome   . Hyperlipidemia   . Chronic headaches   . Eosinophilic esophagitis   . Seasonal allergies   . Cervical spine degeneration   . Cataract   . Allergy   . Clotting disorder 01/2013  . Pulmonary embolism 02/2013  . TIA (transient ischemic attack) 05/2013    Past Surgical History  Procedure Laterality Date  . Cholecystectomy      ERCP for cbd stones  . Tonsillectomy    . Cataract surgery      bilateral  . Hand tendon surgery      left, work comp  . Ercp    . Colonoscopy    . Upper gastrointestinal endoscopy       Current Outpatient Prescriptions  Medication Sig Dispense Refill  . loratadine (CLARITIN) 10 MG tablet Take 10 mg by mouth daily.    . montelukast (SINGULAIR) 10 MG tablet Take 1 tablet (10 mg total) by mouth daily. 90 tablet 3  . Multiple Vitamins-Minerals (CENTRUM SILVER PO)  Take 1 tablet by mouth daily.    . NONFORMULARY OR COMPOUNDED ITEM Apply 1 application topically daily. Testosterone Gel 10% Gel - Get's at [facility]    . OVER THE COUNTER MEDICATION OTC Mega Red Krill Oil 300 mg taking everyday    . OVER THE COUNTER MEDICATION Take 1 tablet by mouth daily. Move Free Ultra (Boron, Hyaluronic acid, Typee II collagenase)    . pantoprazole (PROTONIX) 40 MG tablet Take 1 tablet (40 mg total) by mouth daily. 90 tablet 3  . PROAIR HFA 108 (90 BASE) MCG/ACT inhaler USE 1 TO 2 PUFFS EVERY FOUR TO SIX HOURS AS NEEDED. 8.5 g 3  . warfarin (COUMADIN) 5 MG tablet Take 1 tablet (5 mg total) by mouth See admin instructions. 30 tablet 3   No current facility-administered medications for this visit.    Allergies:   Codeine    Social History:  The patient  reports that he has been passively smoking Cigarettes.  He has never used smokeless tobacco. He reports that he does not drink alcohol or use illicit drugs.   Family History:  The patient's family history  includes Allergies in his mother and sister; Irritable bowel syndrome in his sister; Thyroid disease in his mother. There is no history of Colon cancer.    ROS:  Please see the history of present illness.   Otherwise, review of systems are positive for none.   All other systems are reviewed and negative.    PHYSICAL EXAM: VS:  There were no vitals taken for this visit. , BMI There is no weight on file to calculate BMI. GEN: Well nourished, well developed, in no acute distress HEENT: normal Neck: no JVD, carotid bruits, or masses Cardiac: RRR; no murmurs, rubs, or gallops,no edema  Respiratory:  clear to auscultation bilaterally, normal work of breathing GI: soft, nontender, nondistended, + BS MS: no deformity or atrophy Skin: warm and dry, no rash Neuro:  Strength and sensation are intact Psych: euthymic mood, full affect   EKG:  EKG is ordered today. The ekg ordered today demonstrates NSR with no ST  changes   Recent Labs: 01/15/2015: Platelets 233; TSH 2.298 02/23/2015: ALT 24; BUN 15; Creat 1.10; Hemoglobin 15.3; Potassium 3.9; Sodium 141    Lipid Panel    Component Value Date/Time   CHOL 223* 02/23/2015 2004   TRIG 294* 02/23/2015 2004   HDL 32* 02/23/2015 2004   CHOLHDL 7.0 02/23/2015 2004   VLDL 59* 02/23/2015 2004   LDLCALC 132* 02/23/2015 2004      Wt Readings from Last 3 Encounters:  02/23/15 176 lb 4 oz (79.946 kg)  02/08/15 175 lb 4 oz (79.493 kg)  01/15/15 177 lb 3.2 oz (80.377 kg)      ASSESSMENT AND PLAN:  1.  Chest pain which sounds atypical.  He has a history of PE in the past but is on anticoagulation with therapeutic INR.  EKG is nonischemic.  He was having a lot of anxiety and stress with work at that time.  His risk factors include male sex, age > 7440, dyslipidemia and passive tobacco exposure for years.  I will set him up for an ETT.  We did discuss coronary calcium scoring but he has a high deductible so would like to hold off on this.   2.  GERD with history of eosinophilic esophagitis on PPI 3.  H/O PE on warfarin 4.  Dyslipidemia - currently not on  Therapy - check FLP at time of ETT 5.  Palpitations - this occurred around the time of his anxiety and CP and has resolved.  Consider event monitor it they reoccur   Current medicines are reviewed at length with the patient today.  The patient does not have concerns regarding medicines.  The following changes have been made:  no change  Labs/ tests ordered today: See above Assessment and Plan No orders of the defined types were placed in this encounter.     Disposition:   FU with me PRN pending results of studies  Signed, Quintella ReichertURNER,Kashish Yglesias R, MD  04/02/2015 2:25 PM    [facility] [address], [place], [place]  [address] Phone: [phone]; Fax: [phone]

## 2015-04-02 NOTE — Patient Instructions (Signed)
Medication Instructions:  Your physician recommends that you continue on your current medications as directed. Please refer to the Current Medication list given to you today.   Labwork: None  Testing/Procedures: Your physician has requested that you have an exercise tolerance test. For further information please visit www.cardiosmart.org. Please also follow instruction sheet, as given.  Follow-Up: Your physician recommends that you schedule a follow-up appointment AS NEEDED with Dr. Turner pending your study results.  Any Other Special Instructions Will Be Listed Below (If Applicable).   

## 2015-04-03 DIAGNOSIS — E785 Hyperlipidemia, unspecified: Secondary | ICD-10-CM | POA: Insufficient documentation

## 2015-04-23 ENCOUNTER — Other Ambulatory Visit: Payer: Self-pay

## 2015-04-23 ENCOUNTER — Ambulatory Visit: Payer: Self-pay

## 2015-05-06 ENCOUNTER — Other Ambulatory Visit: Payer: Self-pay | Admitting: Pharmacist

## 2015-05-06 DIAGNOSIS — I82409 Acute embolism and thrombosis of unspecified deep veins of unspecified lower extremity: Secondary | ICD-10-CM

## 2015-05-07 ENCOUNTER — Ambulatory Visit (HOSPITAL_BASED_OUTPATIENT_CLINIC_OR_DEPARTMENT_OTHER): Payer: BLUE CROSS/BLUE SHIELD | Admitting: Pharmacist

## 2015-05-07 ENCOUNTER — Inpatient Hospital Stay (HOSPITAL_COMMUNITY): Admission: RE | Admit: 2015-05-07 | Payer: Self-pay | Source: Ambulatory Visit

## 2015-05-07 ENCOUNTER — Ambulatory Visit: Payer: Self-pay

## 2015-05-07 ENCOUNTER — Other Ambulatory Visit: Payer: Self-pay

## 2015-05-07 ENCOUNTER — Other Ambulatory Visit (INDEPENDENT_AMBULATORY_CARE_PROVIDER_SITE_OTHER): Payer: BLUE CROSS/BLUE SHIELD

## 2015-05-07 ENCOUNTER — Other Ambulatory Visit (HOSPITAL_BASED_OUTPATIENT_CLINIC_OR_DEPARTMENT_OTHER): Payer: BLUE CROSS/BLUE SHIELD

## 2015-05-07 DIAGNOSIS — I82409 Acute embolism and thrombosis of unspecified deep veins of unspecified lower extremity: Secondary | ICD-10-CM

## 2015-05-07 DIAGNOSIS — D6852 Prothrombin gene mutation: Secondary | ICD-10-CM | POA: Diagnosis not present

## 2015-05-07 DIAGNOSIS — E785 Hyperlipidemia, unspecified: Secondary | ICD-10-CM | POA: Diagnosis not present

## 2015-05-07 DIAGNOSIS — I2699 Other pulmonary embolism without acute cor pulmonale: Secondary | ICD-10-CM

## 2015-05-07 DIAGNOSIS — D6859 Other primary thrombophilia: Secondary | ICD-10-CM

## 2015-05-07 LAB — POCT INR: INR: 1.8

## 2015-05-07 LAB — LIPID PANEL
CHOL/HDL RATIO: 6
Cholesterol: 196 mg/dL (ref 0–200)
HDL: 35.3 mg/dL — AB (ref >39.00)
LDL Cholesterol: 142 mg/dL — ABNORMAL HIGH (ref 0–99)
NonHDL: 160.79
Triglycerides: 94 mg/dL (ref 0.0–149.0)
VLDL: 18.8 mg/dL (ref 0.0–40.0)

## 2015-05-07 LAB — PROTIME-INR
INR: 1.8 — ABNORMAL LOW (ref 2.00–3.50)
Protime: 21.6 Seconds — ABNORMAL HIGH (ref 10.6–13.4)

## 2015-05-07 LAB — ALT: ALT: 25 U/L (ref 0–53)

## 2015-05-07 NOTE — Progress Notes (Signed)
LAST COUMADIN CLINIC VISIT  INR = 1.8   Goal 2-3 INR is just below goal range. He has been stable at the current dose for several months. No medication changes. Only dietary change is that he has been eating more figs that he is growing. He has received the notification letter that the Coumadin clinic is closing. His next INR will be drawn on 06/04/15 and resulted to Dr. Darnelle Catalan. He is aware that since 9/16 is a Friday, he might not get INR results until Monday 9/19. He will continue Coumadin  M/WF and 2.5mg  other days. Next INR on 06/04/15 at 3:45pm.  Cletis Athens, PharmD

## 2015-05-10 ENCOUNTER — Other Ambulatory Visit: Payer: Self-pay

## 2015-05-10 ENCOUNTER — Ambulatory Visit: Payer: Self-pay

## 2015-05-10 ENCOUNTER — Telehealth: Payer: Self-pay | Admitting: Pharmacist

## 2015-05-10 NOTE — Telephone Encounter (Signed)
Patient called in f/u from last week's visit in Coumadin clinic re: diet changes.  He has stopped drinking sodas.  He is now drinking more tea (black tea) and water w/ black cherry flavoring drops. I informed him neither of these should interfere w/ his INR. Ebony Hail, Pharm.D., CPP 05/10/2015@11 :06 AM

## 2015-05-11 ENCOUNTER — Encounter: Payer: Self-pay | Admitting: Cardiology

## 2015-05-11 NOTE — Telephone Encounter (Signed)
Follow up ° ° °Pt returning your call °

## 2015-05-11 NOTE — Telephone Encounter (Signed)
This encounter was created in error - please disregard.

## 2015-05-11 NOTE — Telephone Encounter (Signed)
Follow up ° ° ° ° ° °Returning Katy's call °

## 2015-05-11 NOTE — Telephone Encounter (Signed)
New message     Pt returning your call about results Pt will be available until 9am

## 2015-05-21 ENCOUNTER — Telehealth (HOSPITAL_COMMUNITY): Payer: Self-pay | Admitting: Radiology

## 2015-05-21 NOTE — Telephone Encounter (Signed)
Encounter complete. 

## 2015-05-26 ENCOUNTER — Inpatient Hospital Stay (HOSPITAL_COMMUNITY): Admission: RE | Admit: 2015-05-26 | Payer: Self-pay | Source: Ambulatory Visit

## 2015-06-04 ENCOUNTER — Other Ambulatory Visit (HOSPITAL_BASED_OUTPATIENT_CLINIC_OR_DEPARTMENT_OTHER): Payer: BLUE CROSS/BLUE SHIELD

## 2015-06-04 DIAGNOSIS — D6852 Prothrombin gene mutation: Secondary | ICD-10-CM

## 2015-06-04 DIAGNOSIS — I82409 Acute embolism and thrombosis of unspecified deep veins of unspecified lower extremity: Secondary | ICD-10-CM

## 2015-06-04 LAB — PROTIME-INR
INR: 2.3 (ref 2.00–3.50)
Protime: 27.6 Seconds — ABNORMAL HIGH (ref 10.6–13.4)

## 2015-06-11 ENCOUNTER — Telehealth: Payer: Self-pay | Admitting: Oncology

## 2015-06-11 ENCOUNTER — Other Ambulatory Visit: Payer: Self-pay

## 2015-06-11 NOTE — Telephone Encounter (Signed)
Confirmed appointment  For October

## 2015-06-11 NOTE — Progress Notes (Signed)
Spoke with patient to inform him of his INR on 06/04/15 which is 2.3  Patient will continue to  Take 5 mg of coumadion M,W,F and 2.5 mg all other days.  Patient is to schedule an appt around 07/02/15- patient states that he would like to extend his draws to 5 or 6 weeks apart.  Writer expressed the draw should be monthly however patient stands firm that he will be in for INR's every 5-6 weeks.

## 2015-06-17 ENCOUNTER — Telehealth: Payer: Self-pay

## 2015-06-17 NOTE — Telephone Encounter (Signed)
Writer called patient today with INR results which were 2.3  Patient stated that he had already received a call  Patient is taking 5 mg Monday, Wednesday, Friday and 2.5 mg every other day.  Patient has a repeat lab on 07/16/15.

## 2015-06-18 ENCOUNTER — Inpatient Hospital Stay (HOSPITAL_COMMUNITY): Admission: RE | Admit: 2015-06-18 | Payer: Self-pay | Source: Ambulatory Visit

## 2015-07-01 ENCOUNTER — Telehealth (HOSPITAL_COMMUNITY): Payer: Self-pay

## 2015-07-01 NOTE — Telephone Encounter (Signed)
Encounter complete. 

## 2015-07-06 ENCOUNTER — Inpatient Hospital Stay (HOSPITAL_COMMUNITY): Admission: RE | Admit: 2015-07-06 | Payer: Self-pay | Source: Ambulatory Visit

## 2015-07-16 ENCOUNTER — Ambulatory Visit: Payer: Self-pay

## 2015-07-16 ENCOUNTER — Other Ambulatory Visit (HOSPITAL_BASED_OUTPATIENT_CLINIC_OR_DEPARTMENT_OTHER): Payer: BLUE CROSS/BLUE SHIELD

## 2015-07-16 DIAGNOSIS — D6852 Prothrombin gene mutation: Secondary | ICD-10-CM | POA: Diagnosis not present

## 2015-07-16 DIAGNOSIS — I82409 Acute embolism and thrombosis of unspecified deep veins of unspecified lower extremity: Secondary | ICD-10-CM

## 2015-07-16 LAB — PROTIME-INR
INR: 2.8 (ref 2.00–3.50)
PROTIME: 33.6 s — AB (ref 10.6–13.4)

## 2015-07-16 NOTE — Progress Notes (Signed)
INR Today: 2.8 Coumadin Dosing: 5 mg Monday, Wednesday & Friday.  2.5 mg all other days. Patient stated understanding.  Writer spoke to him in the lobby.  Repeat INR in one month.

## 2015-08-11 ENCOUNTER — Encounter (HOSPITAL_COMMUNITY): Payer: Self-pay

## 2015-08-16 ENCOUNTER — Ambulatory Visit (INDEPENDENT_AMBULATORY_CARE_PROVIDER_SITE_OTHER): Payer: BLUE CROSS/BLUE SHIELD | Admitting: Family Medicine

## 2015-08-16 ENCOUNTER — Other Ambulatory Visit (HOSPITAL_BASED_OUTPATIENT_CLINIC_OR_DEPARTMENT_OTHER): Payer: BLUE CROSS/BLUE SHIELD

## 2015-08-16 ENCOUNTER — Ambulatory Visit (INDEPENDENT_AMBULATORY_CARE_PROVIDER_SITE_OTHER): Payer: BLUE CROSS/BLUE SHIELD

## 2015-08-16 ENCOUNTER — Ambulatory Visit: Payer: Self-pay

## 2015-08-16 ENCOUNTER — Encounter: Payer: Self-pay | Admitting: Internal Medicine

## 2015-08-16 ENCOUNTER — Ambulatory Visit (HOSPITAL_BASED_OUTPATIENT_CLINIC_OR_DEPARTMENT_OTHER): Payer: BLUE CROSS/BLUE SHIELD | Admitting: Oncology

## 2015-08-16 VITALS — BP 121/52 | HR 79 | Temp 98.0°F | Resp 20 | Ht 68.0 in | Wt 181.5 lb

## 2015-08-16 VITALS — BP 108/70 | HR 90 | Temp 98.8°F | Resp 18 | Ht 68.0 in | Wt 181.2 lb

## 2015-08-16 DIAGNOSIS — M542 Cervicalgia: Secondary | ICD-10-CM | POA: Diagnosis not present

## 2015-08-16 DIAGNOSIS — D6859 Other primary thrombophilia: Secondary | ICD-10-CM | POA: Diagnosis not present

## 2015-08-16 DIAGNOSIS — Q984 Klinefelter syndrome, unspecified: Secondary | ICD-10-CM | POA: Diagnosis not present

## 2015-08-16 DIAGNOSIS — M25532 Pain in left wrist: Secondary | ICD-10-CM

## 2015-08-16 DIAGNOSIS — I82409 Acute embolism and thrombosis of unspecified deep veins of unspecified lower extremity: Secondary | ICD-10-CM | POA: Diagnosis not present

## 2015-08-16 DIAGNOSIS — I2782 Chronic pulmonary embolism: Secondary | ICD-10-CM

## 2015-08-16 DIAGNOSIS — W19XXXA Unspecified fall, initial encounter: Secondary | ICD-10-CM

## 2015-08-16 DIAGNOSIS — Z23 Encounter for immunization: Secondary | ICD-10-CM | POA: Diagnosis not present

## 2015-08-16 DIAGNOSIS — I2699 Other pulmonary embolism without acute cor pulmonale: Secondary | ICD-10-CM

## 2015-08-16 LAB — PROTIME-INR
INR: 2.8 (ref 2.00–3.50)
Protime: 33.6 Seconds — ABNORMAL HIGH (ref 10.6–13.4)

## 2015-08-16 MED ORDER — CELECOXIB 200 MG PO CAPS: 200.0000 mg | ORAL_CAPSULE | Freq: Every day | ORAL | Status: DC | PRN

## 2015-08-16 MED ORDER — TRAMADOL HCL 50 MG PO TABS: 50.0000 mg | ORAL_TABLET | Freq: Four times a day (QID) | ORAL | Status: DC | PRN

## 2015-08-16 MED ORDER — WARFARIN SODIUM 5 MG PO TABS: ORAL_TABLET | ORAL | Status: DC

## 2015-08-16 MED ORDER — CYCLOBENZAPRINE HCL 5 MG PO TABS: 5.0000 mg | ORAL_TABLET | Freq: Three times a day (TID) | ORAL | Status: DC | PRN

## 2015-08-16 MED ORDER — WARFARIN SODIUM 5 MG PO TABS: 5.0000 mg | ORAL_TABLET | ORAL | Status: DC

## 2015-08-16 NOTE — Progress Notes (Signed)
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 He was diagnosed in 26 at [facility] and was started on testosterone supplementation then. More recently his testosterone level was found to be somewhat lower than expected and his dose was increased (April 2014). 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    A few weeks later, on 02/08/2013 he presented to Urgent Care with swelling in his Right leg and Dopplers showed acute deep vein thrombosis involving posterior tibial and peroneal veins, coursing through to the popliteal and extending to mid femoral veins of the right lower extremity. He was started on xarelto/ rivaroxaban, and purchsed 15 tablets initially for $142, then 27 tablets for $250 (he used a company coupon so actually only paid $5).  Repeat dopplers 02/14/2013 showed acute to subacute deep vein thrombosis involving the right femoral, popliteal, proximal posterior tibial, and proximal peroneal veins. This was felt to be at least stable. However this morning (02/19/2013) the patient developed some strange crawling feeling in his Right leg folowed by chest pressure. He presented to the ED and CT-angio was positive for pulmonary embolus with evidence of both acute and chronic thromboembolic disease in the lungs bilaterally. All filling defects in the pulmonary   arteries appeared to be  nonocclusive. There was no evidence of pulmonary hemorrhage to suggest a frank pulmonary infarction.  Antonio [name] was admitted for IV heparinization and we were consulted for further management  His subsequent history is as detailed below       INTERVAL HISTORY: Antonio [name] returns today for followup of his factor V Leiden associated coagulopathy. Because of his clotting history he is a lifetime warfarin. We are checking his INR every 4-5 weeks area did he is currently on 5 mg a warfarin Monday Wednesday Friday and 2.5 mg the other 4 days of the week. With that recipe he has had a very stable reading between 2.0 and 3.0  REVIEW OF SYSTEMS: Antonio [name] Recently was on a latter trying to get some yard work done and fell backwards. He did not hit his head. He does have significant problems with arthritis and he occasionally takes Advil for this. When he does see notices that he can bleed more than usual when they check his INR. Otherwise he has had no unusual bleeding problems. He also has had no further clotting problems that he is aware of. A detailed review of systems today was otherwise stable.  PAST MEDICAL HISTORY: Past Medical History  Diagnosis Date  . GERD (gastroesophageal reflux disease)   . Asthma   . Klinefelter syndrome   . Hyperlipidemia   . Chronic headaches   . Eosinophilic esophagitis   . Seasonal allergies   . Cervical spine degeneration   . Cataract   . Allergy   . Clotting disorder 01/2013  . Pulmonary embolism 02/2013  . TIA (transient ischemic attack) 05/2013  PAST SURGICAL HISTORY: Past Surgical History  Procedure Laterality Date  . Cholecystectomy      ERCP for cbd stones  . Tonsillectomy    . Cataract surgery      bilateral  . Hand tendon surgery      left, work comp  . Ercp    . Colonoscopy    . Upper gastrointestinal endoscopy      FAMILY HISTORY Family History  Problem Relation Age of Onset  . Colon cancer Neg Hx   . Irritable bowel syndrome Sister   .  Allergies Sister   . Allergies Mother   . Thyroid disease Mother   The patient's father drowned when the patient was 44 years old (rip tide [facility]). The patient's mother is currently 7. The patient has one sister. 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 His wife [name] used to work at [employer] but is now retired, with significant COPD problems--she is on 24/7 oxygen, has been admitted for CO retention, and according to Antonio [name] continues to smoke.Marland Kitchen She has 3 sons from a prior marriage ("a Information systems manager, a Corporate investment banker, and an optometrist"). They have 4 biological and 2 step grandchildren. The patient is not a church attender                          ADVANCED DIRECTIVES: in place   HEALTH MAINTENANCE:  (Updated 04/21/215) Social History  Substance Use Topics  . Smoking status: Passive Smoke Exposure - Never Smoker    Types: Cigarettes  . Smokeless tobacco: Never Used  . Alcohol Use: No               Colonoscopy: 2008/[facility]             PSA: April 2014, 0.52/[facility]             Lipid panel: April 2014/[facility]   Allergies  Allergen Reactions  . Codeine Shortness Of Breath, Nausea Only and Other (See Comments)    REACTION: difficulty breathing, nausea    Current Outpatient Prescriptions  Medication Sig Dispense Refill  . loratadine (CLARITIN) 10 MG tablet Take 10 mg by mouth daily.    . montelukast (SINGULAIR) 10 MG tablet Take 1 tablet (10 mg total) by mouth daily. 90 tablet 3  . Multiple Vitamins-Minerals (CENTRUM SILVER PO) Take 1 tablet by mouth daily.    . NONFORMULARY OR COMPOUNDED ITEM Apply 1 application topically daily. Testosterone Gel 10% Gel - Get's at [facility]    . OVER THE COUNTER MEDICATION OTC Mega Red Krill Oil  300 mg taking everyday    . OVER THE COUNTER MEDICATION Take 1 tablet by mouth daily. Move Free Ultra (Boron, Hyaluronic acid, Typee II collagenase)    . pantoprazole (PROTONIX) 40 MG tablet Take 1 tablet (40 mg total) by mouth daily. 90 tablet 3  . PROAIR HFA 108 (90 BASE) MCG/ACT inhaler USE 1 TO 2 PUFFS EVERY FOUR TO SIX HOURS AS NEEDED. 8.5 g 3  . warfarin (COUMADIN) 5 MG tablet Take 1 tablet (5 mg total) by mouth See admin instructions. 30 tablet 3   No current facility-administered medications for this visit.    OBJECTIVE:  Middle aged white man who appears well  Filed Vitals:   08/16/15 1549  BP: 121/52  Pulse: 79  Temp: 98 F (36.7 C)  Resp: 20     Body mass index is 27.6 kg/(m^2).    ECOG FS: 0 Filed Weights   08/16/15 1549  Weight: 181 lb 8 oz (82.328 kg)   Sclerae unicteric, EOMs intact Oropharynx clear, dentition in good repair No cervical or supraclavicular adenopathy Lungs no rales or rhonchi Heart regular rate and rhythm Abd soft, nontender, positive bowel sounds MSK no focal spinal tenderness, no upper extremity lymphedema Neuro: nonfocal, well oriented, appropriate affect Breasts:   LAB RESULTS:  Results for Art BuffMICHAEL, Levell W (MRN [id number]) as of 08/16/2015 16:24  Ref. Range 05/07/2015 00:00 05/07/2015 14:06 06/04/2015 15:34 07/16/2015 14:48 08/16/2015 15:30  INR Latest Ref Range: 2.00-3.50  1.8 1.80 (L) 2.30 2.80 2.80   STUDIES: No results found.   ASSESSMENT: 59 [place] with   (1) Klinefelter's syndrome, [facility], on lifelong testosterone replacement  (2) factor V Leyden mutation documented April 2014  (3) Right lower extremity DVT documented 02/08/2013, started on rivaroxaban/ Xarelto same day, with acute and chronic pulmonary emboli documented 02/19/2013, switched to warfarin with lovenox bridge  (a) chronic RLE edema, grade 1  (4) on lifelong warfarin as of 02/21/2013 with INR goal of 2.0 - 3.0   PLAN: Antonio BeckersWalter is  doing fine as far as his coagulopathy is concerned that I am making no changes in his treatment.  He understands we no longer have a Coumadin clinic here. Nevertheless he finds it convenient to have his INRs checked here every 4-5 weeks and we are operationalizing that for the next year.  We discussed pain issues today. He understands that when he takes Aleve, Advil, aspirin, or similar drugs, he is poisoning his platelets and putting himself at increased bleeding risk. I suggested he consider Tylenol, tramadolor Celebrex, in that order. He does have a very narrow esophagus so if the pills cannot be crushed or if they are at all large he would not be able to swallow them. He is, give the tramadol however a try  I also urged him not to get all ladders or anywhere else where he can fall and hit his head since that could be fatal Solis is anticoagulated  Otherwise she will return to see me in a year. He knows to call for any problems that may develop before the next visit here.   Lowella DellMAGRINAT,Star Resler C, MD   08/16/2015 4:01 PM

## 2015-08-16 NOTE — Addendum Note (Signed)
Addended by: Deloros Beretta, [place] on: 08/16/2015 06:03 PM   Modules accepted: Orders

## 2015-08-16 NOTE — Progress Notes (Signed)
INR Today-2.8 Current coumadin dose- 5 mg M,W, & F;  2.5 mg all other days. Patient notified to stay on same dose.  Repeat INR in one month.

## 2015-08-16 NOTE — Progress Notes (Addendum)
 @  This chart was scribed for Elvina Sidle, MD by Andrew Au, ED Scribe. This patient was seen in room 9 and the patient's care was started at 5:55 PM.  Patient ID: ARMOND CUTHRELL MRN: [id number], DOB: Jun 03, 1956, 59 y.o. Date of Encounter: 08/16/2015, 5:55 PM  Primary Physician: Tonye Pearson, MD  Chief Complaint:  Chief Complaint  Patient presents with  . other    last saturday fell and injured neck , back and wrist    HPI: 59 y.o. year old male with history below presents with a fall that occurred 2 days ago. Pt fell from the 3rd rung of ladder directly onto his back. He now has left wrist pain, low back pain and neck pain. He has hx of cervical degeneration and reports pain has worsened since fall. He has been icing neck without relief. He has been seen by Dr. Luiz Blare in the past for reconnection ligaments and tendons in left wrist, which was also exacerbated after fall.  Pt had INR checked today by Dr. Darnelle Catalan.  He sells Engineer, technical sales for [employer].     Past Medical History  Diagnosis Date  . GERD (gastroesophageal reflux disease)   . Asthma   . Klinefelter syndrome   . Hyperlipidemia   . Chronic headaches   . Eosinophilic esophagitis   . Seasonal allergies   . Cervical spine degeneration   . Cataract   . Allergy   . Clotting disorder (HCC) 01/2013  . Pulmonary embolism (HCC) 02/2013  . TIA (transient ischemic attack) 05/2013     Home Meds: Prior to Admission medications   Medication Sig Start Date End Date Taking? Authorizing Provider  loratadine (CLARITIN) 10 MG tablet Take 10 mg by mouth daily.   Yes Historical Provider, MD  montelukast (SINGULAIR) 10 MG tablet Take 1 tablet (10 mg total) by mouth daily. 02/08/15  Yes Iva Boop, MD  Multiple Vitamins-Minerals (CENTRUM SILVER PO) Take 1 tablet by mouth daily. 02/24/13  Yes Historical Provider, MD  NONFORMULARY OR COMPOUNDED ITEM Apply 1 application topically daily. Testosterone Gel 10% Gel - Get's at  [facility]   Yes Historical Provider, MD  OVER THE COUNTER MEDICATION OTC Mega Red Krill Oil 300 mg taking everyday   Yes Historical Provider, MD  OVER THE COUNTER MEDICATION Take 1 tablet by mouth daily. Move Free Ultra (Boron, Hyaluronic acid, Typee II collagenase) 10/19/14  Yes Historical Provider, MD  pantoprazole (PROTONIX) 40 MG tablet Take 1 tablet (40 mg total) by mouth daily. 02/08/15  Yes Iva Boop, MD  PROAIR HFA 108 (90 BASE) MCG/ACT inhaler USE 1 TO 2 PUFFS EVERY FOUR TO SIX HOURS AS NEEDED. 03/11/15  Yes Thao P Le, DO  warfarin (COUMADIN) 5 MG tablet Take one tablet (5 mg) daily on MON/ WED/FRI, take 1/2 tablet (2.5 mg) the other days of the week. 08/16/15  Yes Lowella Dell, MD  celecoxib (CELEBREX) 200 MG capsule Take 1 capsule (200 mg total) by mouth daily as needed. Patient not taking: Reported on 08/16/2015 08/16/15   Lowella Dell, MD  traMADol (ULTRAM) 50 MG tablet Take 1 tablet (50 mg total) by mouth every 6 (six) hours as needed. Patient not taking: Reported on 08/16/2015 08/16/15   Lowella Dell, MD    Allergies:  Allergies  Allergen Reactions  . Codeine Shortness Of Breath, Nausea Only and Other (See Comments)    REACTION: difficulty breathing, nausea    Social History   Social History  .  Marital Status: Married    Spouse Name: Aram BeechamCynthia  . Number of Children: 0  . 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 Smoking status: Passive Smoke Exposure - Never Smoker    Types: Cigarettes  . Smokeless tobacco: Never Used  . Alcohol Use: No  . Drug Use: No  . Sexual Activity:    Partners: Female   Other Topics Concern  . Not on file   Social History Narrative   Lives with his wife. She has been diagnosed with COPD and continues to smoke.     Review of Systems: Constitutional: negative for chills, fever, night sweats, weight changes, or fatigue  HEENT: negative for vision changes,  hearing loss, congestion, rhinorrhea, ST, epistaxis, or sinus pressure Cardiovascular: negative for chest pain or palpitations Respiratory: negative for hemoptysis, wheezing, shortness of breath, or cough Abdominal: negative for abdominal pain, nausea, vomiting, diarrhea, or constipation Dermatological: negative for rash Neurologic: negative for headache, dizziness, or syncope All other systems reviewed and are otherwise negative with the exception to those above and in the HPI.   Physical Exam: Blood pressure 108/70, pulse 90, temperature 98.8 F (37.1 C), temperature source Oral, resp. rate 18, height 5\' 8"  (1.727 m), weight 181 lb 3.2 oz (82.192 kg), SpO2 97 %., Body mass index is 27.56 kg/(m^2). General: Well developed, well nourished, in no acute distress. Head: Normocephalic, atraumatic, eyes without discharge, sclera non-icteric, nares are without discharge. Bilateral auditory canals clear, TM's are without perforation, pearly grey and translucent with reflective cone of light bilaterally. Oral cavity moist, posterior pharynx without exudate, erythema, peritonsillar abscess, or post nasal drip.  Neck: Supple. No thyromegaly. Pain when looking to the right.. Lungs: Clear bilaterally to auscultation without wheezes, rales, or rhonchi. Breathing is unlabored. Heart: RRR with S1 S2. No murmurs, rubs, or gallops appreciated. Msk:  Strength and tone normal for age. Mild swelling and ecchymosis dorsal left wrist. Tenderness in left neck.  Extremities/Skin: Warm and dry. No clubbing or cyanosis. No edema. No rashes or suspicious lesions. Neuro: Alert and oriented X 3. Moves all extremities spontaneously. Gait is normal. CNII-XII grossly in tact. Psych:  Responds to questions appropriately with a normal affect.   [facility] reading (PRIMARY) by Dr. Milus GlazierLauenstein Left wrist:  neg C-spine :neg  ASSESSMENT AND PLAN:  59 y.o. year old male with  This chart was scribed in my presence and reviewed by me  personally.  This chart was scribed in my presence and reviewed by me personally.    ICD-9-CM ICD-10-CM   1. Neck pain 723.1 M54.2 DG Cervical Spine 2 or 3 views     cyclobenzaprine (FLEXERIL) 5 MG tablet  2. Wrist pain, acute, left 719.43 M25.532 DG Wrist Complete Left     cyclobenzaprine (FLEXERIL) 5 MG tablet  3. Fall, initial encounter [phone] W19.XXXA DG Wrist Complete Left     DG Cervical Spine 2 or 3 views     cyclobenzaprine (FLEXERIL) 5 MG tablet     Signed, Elvina SidleKurt Elizeth Weinrich, MD   ing my name below, I, Raven Small, attest that this documentation has been prepared under the direction and in the presence of Elvina SidleKurt Keylon Labelle, MD.  Electronically Signed: Andrew Auaven Small, ED Scribe. 08/16/2015. 5:55 PM.  Signed, Elvina SidleKurt Avanthika Dehnert, MD 08/16/2015 5:55 PM

## 2015-08-18 ENCOUNTER — Telehealth: Payer: Self-pay | Admitting: Oncology

## 2015-08-18 ENCOUNTER — Telehealth: Payer: Self-pay

## 2015-08-18 NOTE — Telephone Encounter (Signed)
s.w. pt and advised on Dec 2016 thru DEC 2017 appt....pt ok and aware

## 2015-08-18 NOTE — Telephone Encounter (Signed)
Writer spoke with patient who stated that he went home and looked up the medication and is not interested in taking the tramadol because it is a "opioid" and he does not want to be associated with this drug.  Patient states that his pain is not that significant that he has to take this medication.  He also decided not to take celebrex because it is in capsule form and he would not be able to swallow this medication and it looses its effectiveness if he had to open the capsule.  Therefore he will not be changing his medication regimen.

## 2015-09-17 ENCOUNTER — Other Ambulatory Visit (HOSPITAL_BASED_OUTPATIENT_CLINIC_OR_DEPARTMENT_OTHER): Payer: BLUE CROSS/BLUE SHIELD

## 2015-09-17 ENCOUNTER — Other Ambulatory Visit: Payer: Self-pay | Admitting: *Deleted

## 2015-09-17 ENCOUNTER — Telehealth: Payer: Self-pay | Admitting: *Deleted

## 2015-09-17 DIAGNOSIS — I2699 Other pulmonary embolism without acute cor pulmonale: Secondary | ICD-10-CM | POA: Diagnosis not present

## 2015-09-17 DIAGNOSIS — Q984 Klinefelter syndrome, unspecified: Secondary | ICD-10-CM

## 2015-09-17 DIAGNOSIS — I82409 Acute embolism and thrombosis of unspecified deep veins of unspecified lower extremity: Secondary | ICD-10-CM

## 2015-09-17 LAB — PROTIME-INR
INR: 2.4 (ref 2.00–3.50)
PROTIME: 28.8 s — AB (ref 10.6–13.4)

## 2015-09-17 NOTE — Telephone Encounter (Signed)
INR 2.4 Current coumadin dose 5 mg  M/W/F , 2.5 mg all other days Next lab scheduled 10/15/2015.  Called pt and spoke with wife regarding no changes in dosing and to keep scheduled appointment.  Wife verbalized understanding.

## 2015-10-14 ENCOUNTER — Other Ambulatory Visit: Payer: Self-pay | Admitting: *Deleted

## 2015-10-14 DIAGNOSIS — D689 Coagulation defect, unspecified: Secondary | ICD-10-CM

## 2015-10-15 ENCOUNTER — Other Ambulatory Visit (HOSPITAL_BASED_OUTPATIENT_CLINIC_OR_DEPARTMENT_OTHER): Payer: BLUE CROSS/BLUE SHIELD

## 2015-10-15 ENCOUNTER — Inpatient Hospital Stay (HOSPITAL_COMMUNITY): Admission: RE | Admit: 2015-10-15 | Payer: Self-pay | Source: Ambulatory Visit

## 2015-10-15 ENCOUNTER — Telehealth: Payer: Self-pay | Admitting: *Deleted

## 2015-10-15 DIAGNOSIS — D689 Coagulation defect, unspecified: Secondary | ICD-10-CM | POA: Diagnosis not present

## 2015-10-15 LAB — PROTIME-INR
INR: 2.6 (ref 2.00–3.50)
Protime: 31.2 Seconds — ABNORMAL HIGH (ref 10.6–13.4)

## 2015-10-15 NOTE — Telephone Encounter (Signed)
INR today is 2.6  Current coumadin dose is  M/W/F with 2.5 mg all other days   Next lab scheduled 11/12/2015.  Per MD review- no change in above.  Called and discussed with pt.

## 2015-11-12 ENCOUNTER — Other Ambulatory Visit (HOSPITAL_BASED_OUTPATIENT_CLINIC_OR_DEPARTMENT_OTHER): Payer: BLUE CROSS/BLUE SHIELD

## 2015-11-12 DIAGNOSIS — D689 Coagulation defect, unspecified: Secondary | ICD-10-CM | POA: Diagnosis not present

## 2015-11-12 LAB — PROTIME-INR
INR: 2.3 (ref 2.00–3.50)
Protime: 27.6 Seconds — ABNORMAL HIGH (ref 10.6–13.4)

## 2015-11-16 ENCOUNTER — Telehealth: Payer: Self-pay | Admitting: *Deleted

## 2015-11-16 ENCOUNTER — Other Ambulatory Visit: Payer: Self-pay | Admitting: *Deleted

## 2015-11-16 NOTE — Telephone Encounter (Signed)
INR  2.3  Coumadin dose 5 mg w/w/f , 2.5mg  other days.  Next lab is 3/24.  Per MD no change in above.  This RN called pt and spoke with his wife with verbalized understanding of above recommendations.

## 2015-11-19 ENCOUNTER — Encounter (HOSPITAL_COMMUNITY): Payer: Self-pay

## 2015-11-26 ENCOUNTER — Other Ambulatory Visit: Payer: Self-pay | Admitting: Internal Medicine

## 2015-12-10 ENCOUNTER — Other Ambulatory Visit (HOSPITAL_BASED_OUTPATIENT_CLINIC_OR_DEPARTMENT_OTHER): Payer: BLUE CROSS/BLUE SHIELD

## 2015-12-10 DIAGNOSIS — D689 Coagulation defect, unspecified: Secondary | ICD-10-CM

## 2015-12-10 LAB — PROTIME-INR
INR: 2 (ref 2.00–3.50)
PROTIME: 24 s — AB (ref 10.6–13.4)

## 2015-12-13 ENCOUNTER — Inpatient Hospital Stay (HOSPITAL_COMMUNITY): Admission: RE | Admit: 2015-12-13 | Payer: Self-pay | Source: Ambulatory Visit

## 2015-12-16 ENCOUNTER — Telehealth: Payer: Self-pay | Admitting: *Deleted

## 2015-12-16 NOTE — Telephone Encounter (Signed)
INR 2.0  Coumadin does 5 mg m/w/f , 2.5 mg other days  No change per MD.  Recheck in 1 month as schedule.  Called and spoke with pt's wife per above with verbalized understandng.

## 2016-01-03 ENCOUNTER — Inpatient Hospital Stay (HOSPITAL_COMMUNITY): Admission: RE | Admit: 2016-01-03 | Payer: Self-pay | Source: Ambulatory Visit

## 2016-01-14 ENCOUNTER — Other Ambulatory Visit (HOSPITAL_BASED_OUTPATIENT_CLINIC_OR_DEPARTMENT_OTHER): Payer: BLUE CROSS/BLUE SHIELD

## 2016-01-14 DIAGNOSIS — D689 Coagulation defect, unspecified: Secondary | ICD-10-CM

## 2016-01-14 DIAGNOSIS — I2699 Other pulmonary embolism without acute cor pulmonale: Secondary | ICD-10-CM

## 2016-01-14 LAB — PROTIME-INR
INR: 2.1 (ref 2.00–3.50)
Protime: 25.2 Seconds — ABNORMAL HIGH (ref 10.6–13.4)

## 2016-01-18 ENCOUNTER — Telehealth: Payer: Self-pay | Admitting: *Deleted

## 2016-01-18 NOTE — Telephone Encounter (Signed)
INR 2.1  Coumadin does 5 mg m/w/f , 2.5 mg other days  No change per MD.  Recheck in 1 month as schedule.  Called and spoke with pt's wife per above with verbalized understandng.

## 2016-01-26 ENCOUNTER — Other Ambulatory Visit: Payer: Self-pay | Admitting: Internal Medicine

## 2016-01-26 NOTE — Telephone Encounter (Signed)
Ok to refill x 4 He should see me this summer

## 2016-01-26 NOTE — Telephone Encounter (Signed)
May I refill Sir? 

## 2016-01-28 ENCOUNTER — Other Ambulatory Visit: Payer: Self-pay | Admitting: *Deleted

## 2016-01-28 DIAGNOSIS — D6859 Other primary thrombophilia: Secondary | ICD-10-CM

## 2016-01-28 DIAGNOSIS — I2782 Chronic pulmonary embolism: Secondary | ICD-10-CM

## 2016-01-28 MED ORDER — WARFARIN SODIUM 5 MG PO TABS: ORAL_TABLET | ORAL | Status: DC

## 2016-02-07 ENCOUNTER — Telehealth: Payer: Self-pay | Admitting: Oncology

## 2016-02-07 NOTE — Telephone Encounter (Signed)
returned call and s.w. pt and r/s appt ....pt ok and aware of new d.t °

## 2016-02-10 ENCOUNTER — Other Ambulatory Visit (HOSPITAL_BASED_OUTPATIENT_CLINIC_OR_DEPARTMENT_OTHER): Payer: BLUE CROSS/BLUE SHIELD

## 2016-02-10 DIAGNOSIS — D689 Coagulation defect, unspecified: Secondary | ICD-10-CM | POA: Diagnosis not present

## 2016-02-10 LAB — PROTIME-INR
INR: 2.6 (ref 2.00–3.50)
Protime: 31.2 Seconds — ABNORMAL HIGH (ref 10.6–13.4)

## 2016-02-11 ENCOUNTER — Inpatient Hospital Stay (HOSPITAL_COMMUNITY): Admission: RE | Admit: 2016-02-11 | Payer: Self-pay | Source: Ambulatory Visit

## 2016-02-11 ENCOUNTER — Other Ambulatory Visit: Payer: Self-pay

## 2016-02-18 ENCOUNTER — Encounter: Payer: Self-pay | Admitting: Cardiovascular Disease

## 2016-03-10 ENCOUNTER — Other Ambulatory Visit: Payer: Self-pay

## 2016-03-17 ENCOUNTER — Other Ambulatory Visit (HOSPITAL_BASED_OUTPATIENT_CLINIC_OR_DEPARTMENT_OTHER): Payer: BLUE CROSS/BLUE SHIELD

## 2016-03-17 DIAGNOSIS — D689 Coagulation defect, unspecified: Secondary | ICD-10-CM

## 2016-03-17 LAB — PROTIME-INR
INR: 2.4 (ref 2.00–3.50)
PROTIME: 28.8 s — AB (ref 10.6–13.4)

## 2016-03-20 ENCOUNTER — Encounter (HOSPITAL_COMMUNITY): Payer: Self-pay

## 2016-04-01 ENCOUNTER — Ambulatory Visit (HOSPITAL_COMMUNITY): Payer: BLUE CROSS/BLUE SHIELD

## 2016-04-01 ENCOUNTER — Encounter (HOSPITAL_COMMUNITY): Payer: Self-pay | Admitting: Emergency Medicine

## 2016-04-01 ENCOUNTER — Ambulatory Visit (HOSPITAL_COMMUNITY)
Admission: EM | Admit: 2016-04-01 | Discharge: 2016-04-01 | Disposition: A | Discharge status: Home / Self Care | Payer: BLUE CROSS/BLUE SHIELD | Attending: Emergency Medicine | Admitting: Emergency Medicine

## 2016-04-01 DIAGNOSIS — Z7901 Long term (current) use of anticoagulants: Secondary | ICD-10-CM | POA: Diagnosis not present

## 2016-04-01 DIAGNOSIS — J45909 Unspecified asthma, uncomplicated: Secondary | ICD-10-CM | POA: Diagnosis not present

## 2016-04-01 DIAGNOSIS — Z79899 Other long term (current) drug therapy: Secondary | ICD-10-CM | POA: Diagnosis not present

## 2016-04-01 DIAGNOSIS — K219 Gastro-esophageal reflux disease without esophagitis: Secondary | ICD-10-CM | POA: Insufficient documentation

## 2016-04-01 DIAGNOSIS — X58XXXA Exposure to other specified factors, initial encounter: Secondary | ICD-10-CM | POA: Diagnosis not present

## 2016-04-01 DIAGNOSIS — E785 Hyperlipidemia, unspecified: Secondary | ICD-10-CM | POA: Insufficient documentation

## 2016-04-01 DIAGNOSIS — S99922A Unspecified injury of left foot, initial encounter: Secondary | ICD-10-CM | POA: Diagnosis not present

## 2016-04-01 DIAGNOSIS — Z885 Allergy status to narcotic agent status: Secondary | ICD-10-CM | POA: Diagnosis not present

## 2016-04-01 NOTE — Discharge Instructions (Signed)
Your x-rays negative for fracture. The box hit your toe, it likely damaged some of the capillaries and blood vessels which caused the bruising. Apply ice and keep it elevated as much as you can. Buddy tape the toes and wear the postop shoe for the next week. Follow-up as needed.

## 2016-04-01 NOTE — ED Provider Notes (Signed)
Patient signed out to me by Dr. Lum BabeEniola. X-rays negative for fracture. He does have significant bruising to the toe. Recommended keeping toes buddy taped. Postop shoe given for comfort. Patient declined any pain medicine. Follow-up as needed.  Charm RingsErin J Ladamien Rammel, MD 04/01/16 [phone]

## 2016-04-01 NOTE — ED Notes (Signed)
Patient reports dropping a piece of equipment on toe at 8 am this morning, second toe is painful

## 2016-04-01 NOTE — ED Provider Notes (Signed)
CSN: [id number]     Arrival date & time 04/01/16  1908 History   First MD Initiated Contact with Patient 04/01/16 2012     No chief complaint on file.  (Consider location/radiation/quality/duration/timing/severity/associated sxs/prior Treatment) Patient is a 60 y.o. male presenting with foot injury.  Foot Injury Location:  Foot Time since incident:  12 hours Injury: yes   Mechanism of injury comment:  He was picking up a box that had a break drum, the box shifted and landed on his toe. Foot location:  L foot (Left 2nd toe injury) Pain details:    Quality:  Aching   Radiates to:  Does not radiate   Severity:  Moderate (9/10 in severity)   Onset quality:  Sudden   Duration:  12 hours   Timing:  Constant   Progression:  Unchanged Chronicity:  New Dislocation: no   Foreign body present:  No foreign bodies Prior injury to area:  No Relieved by:  Nothing Worsened by:  Nothing tried Ineffective treatments:  None tried Associated symptoms: no decreased ROM, no fatigue, no fever, no numbness, no stiffness, no swelling and no tingling   Associated symptoms comment:  He has been walking on it till he came to the ED Risk factors comment:  He had fracture of his right toe a while ago.   Past Medical History  Diagnosis Date  . GERD (gastroesophageal reflux disease)   . Asthma   . Klinefelter syndrome   . Hyperlipidemia   . Chronic headaches   . Eosinophilic esophagitis   . Seasonal allergies   . Cervical spine degeneration   . Cataract   . Allergy   . Clotting disorder (HCC) 01/2013  . Pulmonary embolism (HCC) 02/2013  . TIA (transient ischemic attack) 05/2013   Past Surgical History  Procedure Laterality Date  . Cholecystectomy      ERCP for cbd stones  . Tonsillectomy    . Cataract surgery      bilateral  . Hand tendon surgery      left, work comp  . Ercp    . Colonoscopy    . Upper gastrointestinal endoscopy     Family History  Problem Relation Age of Onset  . Colon  cancer Neg Hx   . Irritable bowel syndrome Sister   . Allergies Sister   . Allergies Mother   . Thyroid disease Mother    Social History  Substance Use Topics  . Smoking status: Passive Smoke Exposure - Never Smoker    Types: Cigarettes  . Smokeless tobacco: Never Used  . Alcohol Use: No    Review of Systems  Constitutional: Negative for fever and fatigue.  Respiratory: Negative.   Cardiovascular: Negative.   Musculoskeletal: Positive for arthralgias. Negative for joint swelling and stiffness.       Left 2nd toe pain  All other systems reviewed and are negative.   Allergies  Codeine  Home Medications   Prior to Admission medications   Medication Sig Start Date End Date Taking? Authorizing Provider  celecoxib (CELEBREX) 200 MG capsule Take 1 capsule (200 mg total) by mouth daily as needed. Patient not taking: Reported on 08/16/2015 08/16/15   Lowella DellGustav C Magrinat, MD  cyclobenzaprine (FLEXERIL) 5 MG tablet Take 1 tablet (5 mg total) by mouth 3 (three) times daily as needed for muscle spasms. 08/16/15   Elvina SidleKurt Lauenstein, MD  loratadine (CLARITIN) 10 MG tablet Take 10 mg by mouth daily.    Historical Provider, MD  montelukast (SINGULAIR) 10  MG tablet TAKE 1 TABLET BY MOUTH ONCE DAILY. 01/26/16   Iva Boop, MD  Multiple Vitamins-Minerals (CENTRUM SILVER PO) Take 1 tablet by mouth daily. 02/24/13   Historical Provider, MD  NONFORMULARY OR COMPOUNDED ITEM Apply 1 application topically daily. Testosterone Gel 10% Gel - Get's at [facility]    Historical Provider, MD  OVER THE COUNTER MEDICATION OTC Mega Red Krill Oil 300 mg taking everyday    Historical Provider, MD  OVER THE COUNTER MEDICATION Take 1 tablet by mouth daily. Move Free Ultra (Boron, Hyaluronic acid, Typee II collagenase) 10/19/14   Historical Provider, MD  pantoprazole (PROTONIX) 40 MG tablet TAKE 1 TABLET BY MOUTH ONCE DAILY. 01/26/16   Iva Boop, MD  PROAIR HFA 108 (90 BASE) MCG/ACT inhaler USE 1 TO 2 PUFFS EVERY  FOUR TO SIX HOURS AS NEEDED. 03/11/15   Thao P Le, DO  warfarin (COUMADIN) 5 MG tablet Take one tablet (5 mg) daily on MON/ WED/FRI, take 1/2 tablet (2.5 mg) the other days of the week. 01/28/16   Lowella Dell, MD   Meds Ordered and Administered this Visit  Medications - No data to display  There were no vitals taken for this visit. No data found.   Physical Exam  Constitutional: He appears well-developed. No distress.  Cardiovascular: Normal rate, regular rhythm and normal heart sounds.   No murmur heard. Pulmonary/Chest: Effort normal and breath sounds normal. No respiratory distress. He has no wheezes.  Musculoskeletal:       Feet:  Nursing note and vitals reviewed.   ED Course  Procedures (including critical care time)  Labs Review Labs Reviewed - No data to display  Imaging Review No results found.   Visual Acuity Review  Right Eye Distance:   Left Eye Distance:   Bilateral Distance:    Right Eye Near:   Left Eye Near:    Bilateral Near:         MDM  No diagnosis found. Toe injury, left, initial encounter  Xray ordered. Patient has been transferred to the hospital for an xray. Result pending. As discussed with patient he will likely need buddy tapping. If positive fracture likely outpatient orthopedic referral. Patient signed out to Dr. Piedad Climes. She will follow up with xray report and discharge patient home.    Doreene Eland, MD 04/01/16 2125

## 2016-04-14 ENCOUNTER — Other Ambulatory Visit (HOSPITAL_BASED_OUTPATIENT_CLINIC_OR_DEPARTMENT_OTHER): Payer: BLUE CROSS/BLUE SHIELD

## 2016-04-14 DIAGNOSIS — D689 Coagulation defect, unspecified: Secondary | ICD-10-CM

## 2016-04-14 LAB — PROTIME-INR
INR: 2.2 (ref 2.00–3.50)
PROTIME: 26.4 s — AB (ref 10.6–13.4)

## 2016-04-17 ENCOUNTER — Inpatient Hospital Stay (HOSPITAL_COMMUNITY): Admission: RE | Admit: 2016-04-17 | Payer: Self-pay | Source: Ambulatory Visit

## 2016-04-19 ENCOUNTER — Other Ambulatory Visit: Payer: Self-pay | Admitting: Oncology

## 2016-04-19 ENCOUNTER — Other Ambulatory Visit: Payer: Self-pay | Admitting: Family Medicine

## 2016-04-19 DIAGNOSIS — D6859 Other primary thrombophilia: Secondary | ICD-10-CM

## 2016-04-19 DIAGNOSIS — I2782 Chronic pulmonary embolism: Secondary | ICD-10-CM

## 2016-04-19 NOTE — Progress Notes (Signed)
04/14/16 - INR 2.20.  Per Dr. Darnelle Catalan, results are fine.  Coumadin refilled 04/19/16.

## 2016-04-19 NOTE — Telephone Encounter (Signed)
Chart reviewed.

## 2016-04-27 ENCOUNTER — Encounter (HOSPITAL_COMMUNITY): Payer: Self-pay

## 2016-05-12 ENCOUNTER — Other Ambulatory Visit (HOSPITAL_BASED_OUTPATIENT_CLINIC_OR_DEPARTMENT_OTHER): Payer: BLUE CROSS/BLUE SHIELD

## 2016-05-12 DIAGNOSIS — D689 Coagulation defect, unspecified: Secondary | ICD-10-CM | POA: Diagnosis not present

## 2016-05-12 LAB — PROTIME-INR
INR: 3.6 — ABNORMAL HIGH (ref 2.00–3.50)
Protime: 43.2 Seconds — ABNORMAL HIGH (ref 10.6–13.4)

## 2016-05-19 ENCOUNTER — Other Ambulatory Visit (HOSPITAL_BASED_OUTPATIENT_CLINIC_OR_DEPARTMENT_OTHER): Payer: BLUE CROSS/BLUE SHIELD

## 2016-05-19 ENCOUNTER — Telehealth: Payer: Self-pay | Admitting: *Deleted

## 2016-05-19 ENCOUNTER — Encounter (HOSPITAL_COMMUNITY): Payer: Self-pay

## 2016-05-19 DIAGNOSIS — D689 Coagulation defect, unspecified: Secondary | ICD-10-CM

## 2016-05-19 LAB — PROTIME-INR
INR: 3.6 — AB (ref 2.00–3.50)
Protime: 43.2 Seconds — ABNORMAL HIGH (ref 10.6–13.4)

## 2016-05-19 NOTE — Telephone Encounter (Signed)
INR 3.6   Noted elevation post recheck with pt stating no changes in diet/ medications or ETOH intake. " the only thing is I am eating a lot of peanut butter "  Hypercoag/Klinefelters syndrome  Current coumadin dose 5 mg m/w/f - 2.5 mg all other days.  Per MD - dose change recommended to 5 mg M/Th , 2.5 mg all other days.  Recheck in 1 week.  Per contact with pt verbalized understanding repeated back to this RN.  Antonio [name] is getting established [place] and inquired if labs could be obtained on Monday 9/11.  Above order will be sent to Dr Chestine SporeNimesh in [place].

## 2016-05-30 ENCOUNTER — Telehealth: Payer: Self-pay

## 2016-05-30 NOTE — Telephone Encounter (Signed)
S/w pt that he was supposed to have had PT/INR done on Friday 9/1. He has not had yet. He agrees. He is asking Rx sent to Dr Lilly CoveNimish [facility] to have it done close to home. Rx sent. Pt stated he is forgetting if he took the days dose and which dose it is. He states he needs to get a medication box to help him. Encouraged him to do this. He states he will get his INR on Thursday 9/14.

## 2016-06-07 ENCOUNTER — Telehealth: Payer: Self-pay | Admitting: *Deleted

## 2016-06-07 NOTE — Telephone Encounter (Signed)
INR 1.9 per ancillary lab Current coumadin dose 5 mg Monday and Friday  2.5 mg other days.  Per call to pt he states he " mixed up a dose and know that is why it is off- "  Per discussion he will be receiving a new med box to assist with appropriate medication dosing and would like to continue current dose until recheck on Monday 9/25.  Above reviewed with MD with agreement in above plan.

## 2016-06-09 ENCOUNTER — Other Ambulatory Visit: Payer: Self-pay

## 2016-06-19 ENCOUNTER — Telehealth: Payer: Self-pay | Admitting: *Deleted

## 2016-06-20 ENCOUNTER — Telehealth: Payer: Self-pay | Admitting: *Deleted

## 2016-06-20 NOTE — Telephone Encounter (Signed)
This RN left message for pt to return call per need for INR - noted not done per last contact with lower INR .  Pt had requested for labs to be drawn at [facility] [place].  Orders faxed but pt did not obtain INR nor has returned call to their office.  Per above INR needed now and preferable at this office for appropriate monitoring.

## 2016-06-22 ENCOUNTER — Telehealth: Payer: Self-pay | Admitting: *Deleted

## 2016-06-22 NOTE — Telephone Encounter (Signed)
This RN spoke with pt per recent INR obtained at primary MD with reading of 2.0.  Per Dr Thea SilversmithGM above is appropriate with no change in current dosing.  Discussed also with pt communication with Dr [facility] who is comfortable also monitoring his coumadin. This would allow for better continuity for pt. Dr Darnelle CatalanMagrinat would see him at his scheduled appointments and if concerns arise.  Zollie BeckersWalter agrees to above plan.  Reviewed next office visit with Dr Darnelle CatalanMagrinat is for 09/14/2016.

## 2016-07-14 ENCOUNTER — Other Ambulatory Visit: Payer: Self-pay

## 2016-08-11 ENCOUNTER — Other Ambulatory Visit: Payer: Self-pay

## 2016-08-30 ENCOUNTER — Inpatient Hospital Stay (HOSPITAL_COMMUNITY): Admission: RE | Admit: 2016-08-30 | Payer: Self-pay | Source: Ambulatory Visit

## 2016-09-01 NOTE — Telephone Encounter (Signed)
No entry 

## 2016-09-01 NOTE — Telephone Encounter (Signed)
NO ENTRY 

## 2016-09-14 ENCOUNTER — Other Ambulatory Visit: Payer: Self-pay

## 2016-09-14 ENCOUNTER — Ambulatory Visit: Payer: Self-pay | Admitting: Oncology

## 2016-09-15 ENCOUNTER — Other Ambulatory Visit: Payer: Self-pay | Admitting: Internal Medicine

## 2016-09-27 ENCOUNTER — Other Ambulatory Visit (HOSPITAL_BASED_OUTPATIENT_CLINIC_OR_DEPARTMENT_OTHER): Payer: BLUE CROSS/BLUE SHIELD

## 2016-09-27 ENCOUNTER — Ambulatory Visit (HOSPITAL_BASED_OUTPATIENT_CLINIC_OR_DEPARTMENT_OTHER): Payer: BLUE CROSS/BLUE SHIELD | Admitting: Oncology

## 2016-09-27 VITALS — BP 112/59 | HR 81 | Temp 98.7°F | Resp 16 | Ht 68.0 in | Wt 179.8 lb

## 2016-09-27 DIAGNOSIS — Z86711 Personal history of pulmonary embolism: Secondary | ICD-10-CM | POA: Diagnosis not present

## 2016-09-27 DIAGNOSIS — D6851 Activated protein C resistance: Secondary | ICD-10-CM | POA: Diagnosis not present

## 2016-09-27 DIAGNOSIS — D689 Coagulation defect, unspecified: Secondary | ICD-10-CM

## 2016-09-27 DIAGNOSIS — R600 Localized edema: Secondary | ICD-10-CM

## 2016-09-27 DIAGNOSIS — Z86718 Personal history of other venous thrombosis and embolism: Secondary | ICD-10-CM | POA: Diagnosis not present

## 2016-09-27 DIAGNOSIS — I2782 Chronic pulmonary embolism: Secondary | ICD-10-CM

## 2016-09-27 DIAGNOSIS — D6859 Other primary thrombophilia: Secondary | ICD-10-CM

## 2016-09-27 DIAGNOSIS — Q984 Klinefelter syndrome, unspecified: Secondary | ICD-10-CM

## 2016-09-27 DIAGNOSIS — Z7901 Long term (current) use of anticoagulants: Secondary | ICD-10-CM

## 2016-09-27 LAB — PROTIME-INR
INR: 1.6 — ABNORMAL LOW (ref 2.00–3.50)
Protime: 19.2 Seconds — ABNORMAL HIGH (ref 10.6–13.4)

## 2016-09-27 NOTE — Progress Notes (Signed)
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 He was diagnosed in 68 at [facility] and was started on testosterone supplementation then. More recently his testosterone level was found to be somewhat lower than expected and his dose was increased (April 2014). 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    A few weeks later, on 02/08/2013 he presented to Urgent Care with swelling in his Right leg and Dopplers showed acute deep vein thrombosis involving posterior tibial and peroneal veins, coursing through to the popliteal and extending to mid femoral veins of the right lower extremity. He was started on xarelto/ rivaroxaban, and purchsed 15 tablets initially for $142, then 27 tablets for $250 (he used a company coupon so actually only paid $5).  Repeat dopplers 02/14/2013 showed acute to subacute deep vein thrombosis involving the right femoral, popliteal, proximal posterior tibial, and proximal peroneal veins. This was felt to be at least stable. However this morning (02/19/2013) the patient developed some strange crawling feeling in his Right leg folowed by chest pressure. He presented to the ED and CT-angio was positive for pulmonary embolus with evidence of both acute and chronic thromboembolic disease in the lungs bilaterally. All filling defects in the pulmonary   arteries appeared to be nonocclusive.  There was no evidence of pulmonary hemorrhage to suggest a frank pulmonary infarction.  Antonio [name] was admitted for IV heparinization and we were consulted for further management  His subsequent history is as detailed below       INTERVAL HISTORY: Antonio [name] returns today for follow-up of his: July 30. He continues on chronic Coumadin. We follow his labs on a monthly basis. There has been a slight decrease in his INR in the last 2 readings. He has not changed his dose but does admit to some dietary indiscretions. His usual dose is Coumadin 5 mg 3 days a week and 2-1/2 mg the other 4 days, alternating. He denies any bleeding problems or any symptoms suggestive of 4 there clotting events  Since his last visit here Antonio [name] has established himself with Dr. Margo Aye. This is much more convenient for him and we are going to be transitioning his monitoring to Dr. 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 He is very concerned about his wife, who is 64 year old and has end-stage COPD. From his description it seems unlikely that she will live through this year. Aside from these issues, a detailed review of systems today was stable  PAST MEDICAL HISTORY: Past Medical History:  Diagnosis Date  . Allergy   . Asthma   . Cataract   . Cervical spine degeneration   . Chronic headaches   . Clotting disorder (HCC) 01/2013  . Eosinophilic esophagitis   . GERD (gastroesophageal reflux disease)   .  Hyperlipidemia   . Klinefelter syndrome   . Pulmonary embolism (HCC) 02/2013  . Seasonal allergies   . TIA (transient ischemic attack) 05/2013    PAST SURGICAL HISTORY: Past Surgical History:  Procedure Laterality Date  . cataract surgery     bilateral  . CHOLECYSTECTOMY     ERCP for cbd stones  . COLONOSCOPY    . ERCP    . HAND TENDON SURGERY     left, work comp  . TONSILLECTOMY    . UPPER  GASTROINTESTINAL ENDOSCOPY      FAMILY HISTORY Family History  Problem Relation Age of Onset  . Colon cancer Neg Hx   . Irritable bowel syndrome Sister   . Allergies Sister   . Allergies Mother   . Thyroid disease Mother   The patient's father drowned when the patient was 61 years old ([place] [facility] The patient's mother is currently [phone]. The patient has one sister. 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 His wife Aram BeechamCynthia used to work at [employer] but is now retired, with significant COPD problems--she is on 24/7 oxygen, has been admitted for CO retention, and according to Antonio BeckersWalter continues to smoke.Marland Kitchen. She has 3 sons from a prior marriage ("a Information systems managerpolitical activist, a Corporate investment bankerconstruction worker, and an optometrist"). They have 4 biological and 2 step grandchildren. The patient is not a church attender                          ADVANCED DIRECTIVES: in place   HEALTH MAINTENANCE:  (Updated 04/21/215) Social History  Substance Use Topics  . Smoking status: Passive Smoke Exposure - Never Smoker    Types: Cigarettes  . Smokeless tobacco: Never Used  . Alcohol use No               Colonoscopy: 2008/[facility]             PSA: April 2014, 0.52/[facility]             Lipid panel: April 2014/[facility]   Allergies  Allergen Reactions  . Codeine Shortness Of Breath, Nausea Only and Other (See Comments)    REACTION: difficulty breathing, nausea    Current Outpatient Prescriptions  Medication Sig Dispense Refill  . celecoxib (CELEBREX) 200 MG capsule Take 1 capsule (200 mg total) by mouth daily as needed. (Patient not taking: Reported on 08/16/2015) 30 capsule 0  . cyclobenzaprine (FLEXERIL) 5 MG tablet Take 1 tablet (5 mg total) by mouth 3 (three) times daily as needed for  muscle spasms. 30 tablet 0  . loratadine (CLARITIN) 10 MG tablet Take 10 mg by mouth daily.    . montelukast (SINGULAIR) 10 MG tablet TAKE 1 TABLET BY MOUTH ONCE DAILY. 30 tablet 0  . Multiple Vitamins-Minerals (CENTRUM SILVER PO) Take 1 tablet by mouth daily.    . NONFORMULARY OR COMPOUNDED ITEM Apply 1 application topically daily. Testosterone Gel 10% Gel - Get's at [facility]    . OVER THE COUNTER MEDICATION OTC Mega Red Krill Oil 300 mg taking everyday    . OVER THE COUNTER MEDICATION Take 1 tablet by mouth daily. Move Free Ultra (Boron, Hyaluronic acid, Typee II collagenase)    . pantoprazole (PROTONIX) 40 MG tablet TAKE  1 TABLET BY MOUTH ONCE DAILY. 30 tablet 0  . PROAIR HFA 108 (90 BASE) MCG/ACT inhaler USE 1 TO 2 PUFFS EVERY FOUR TO SIX HOURS AS NEEDED. 8.5 g 3  . warfarin (COUMADIN) 5 MG tablet TAKE 1 TABLET BY MOUTH ON MONDAY,WEDNESDAY, & FRIDAY. TAKE 1/2 TABLETOTHER DAYS OF THE WEEK. 20 tablet 2   No current facility-administered medications for this visit.     OBJECTIVE:  Middle aged white man who appears well Vitals:   09/27/16 1554  BP: (!) 112/59  Pulse: 81  Resp: 16  Temp: 98.7 F (37.1 C)     Body mass index is 27.34 kg/m.    ECOG FS: 0 Filed Weights   09/27/16 1554  Weight: 179 lb 12.8 oz (81.6 kg)   Sclerae unicteric, pupils round and equal Oropharynx clear and moist-- no thrush or other lesions No cervical or supraclavicular adenopathy Lungs no rales or rhonchi Heart regular rate and rhythm Abd soft, nontender, positive bowel sounds MSK no focal spinal tenderness, no upper extremity lymphedema Neuro: nonfocal, well oriented, appropriate affect  LAB RESULTS: Results for BRENNAN, LITZINGER (MRN [id number]) as of 09/28/2016 10:40  Ref. Range 03/17/2016 15:26 04/14/2016 15:36 05/12/2016 15:50 05/19/2016 15:32 09/27/2016 15:17  INR Latest Ref Range: 2.00 - 3.50  2.40 2.20 3.60 (H) 3.60 (H) 1.60 (L)    STUDIES: No results found.   ASSESSMENT: 61 y.o.  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  At this point all I am asking to do is take an extra 2.5 mg today. This means he will have taken 5 mg 3 days in a row and then go back to his usual schedule, which is Coumadin 5 mg on Mondays Wednesdays and Fridays, and 2.5 mg the other 4 days. On the schedule he has had a generally very stable INR in the 2.0-3.0 range, with no bleeding complications.  I am delighted that he has established himself with Dr. Margo Aye. This means he can get the rest of his health maintenance updated. He can most efficiently get his INR checks there as well, so we are making no further routine appointments for Antonio [name] here. He knows I will be glad to see many point in the future however if on when the need arises.   Lowella Dell, MD   09/28/2016 10:40 AM

## 2016-11-27 ENCOUNTER — Other Ambulatory Visit: Payer: Self-pay | Admitting: Internal Medicine

## 2016-11-27 NOTE — Telephone Encounter (Signed)
Ok to refill Sir, thank you for your time. 

## 2016-11-27 NOTE — Telephone Encounter (Signed)
Refill x 1 year ok 

## 2017-01-09 IMAGING — CR DG CHEST 2V
2 series · 2 of 2 positions shown · non-contrast
Comparison: None.

CLINICAL DATA: Acute onset left-sided chest pain and tachycardia.
Personal history of pulmonary embolism.

EXAM:
CHEST  2 VIEW

[w chest pa]
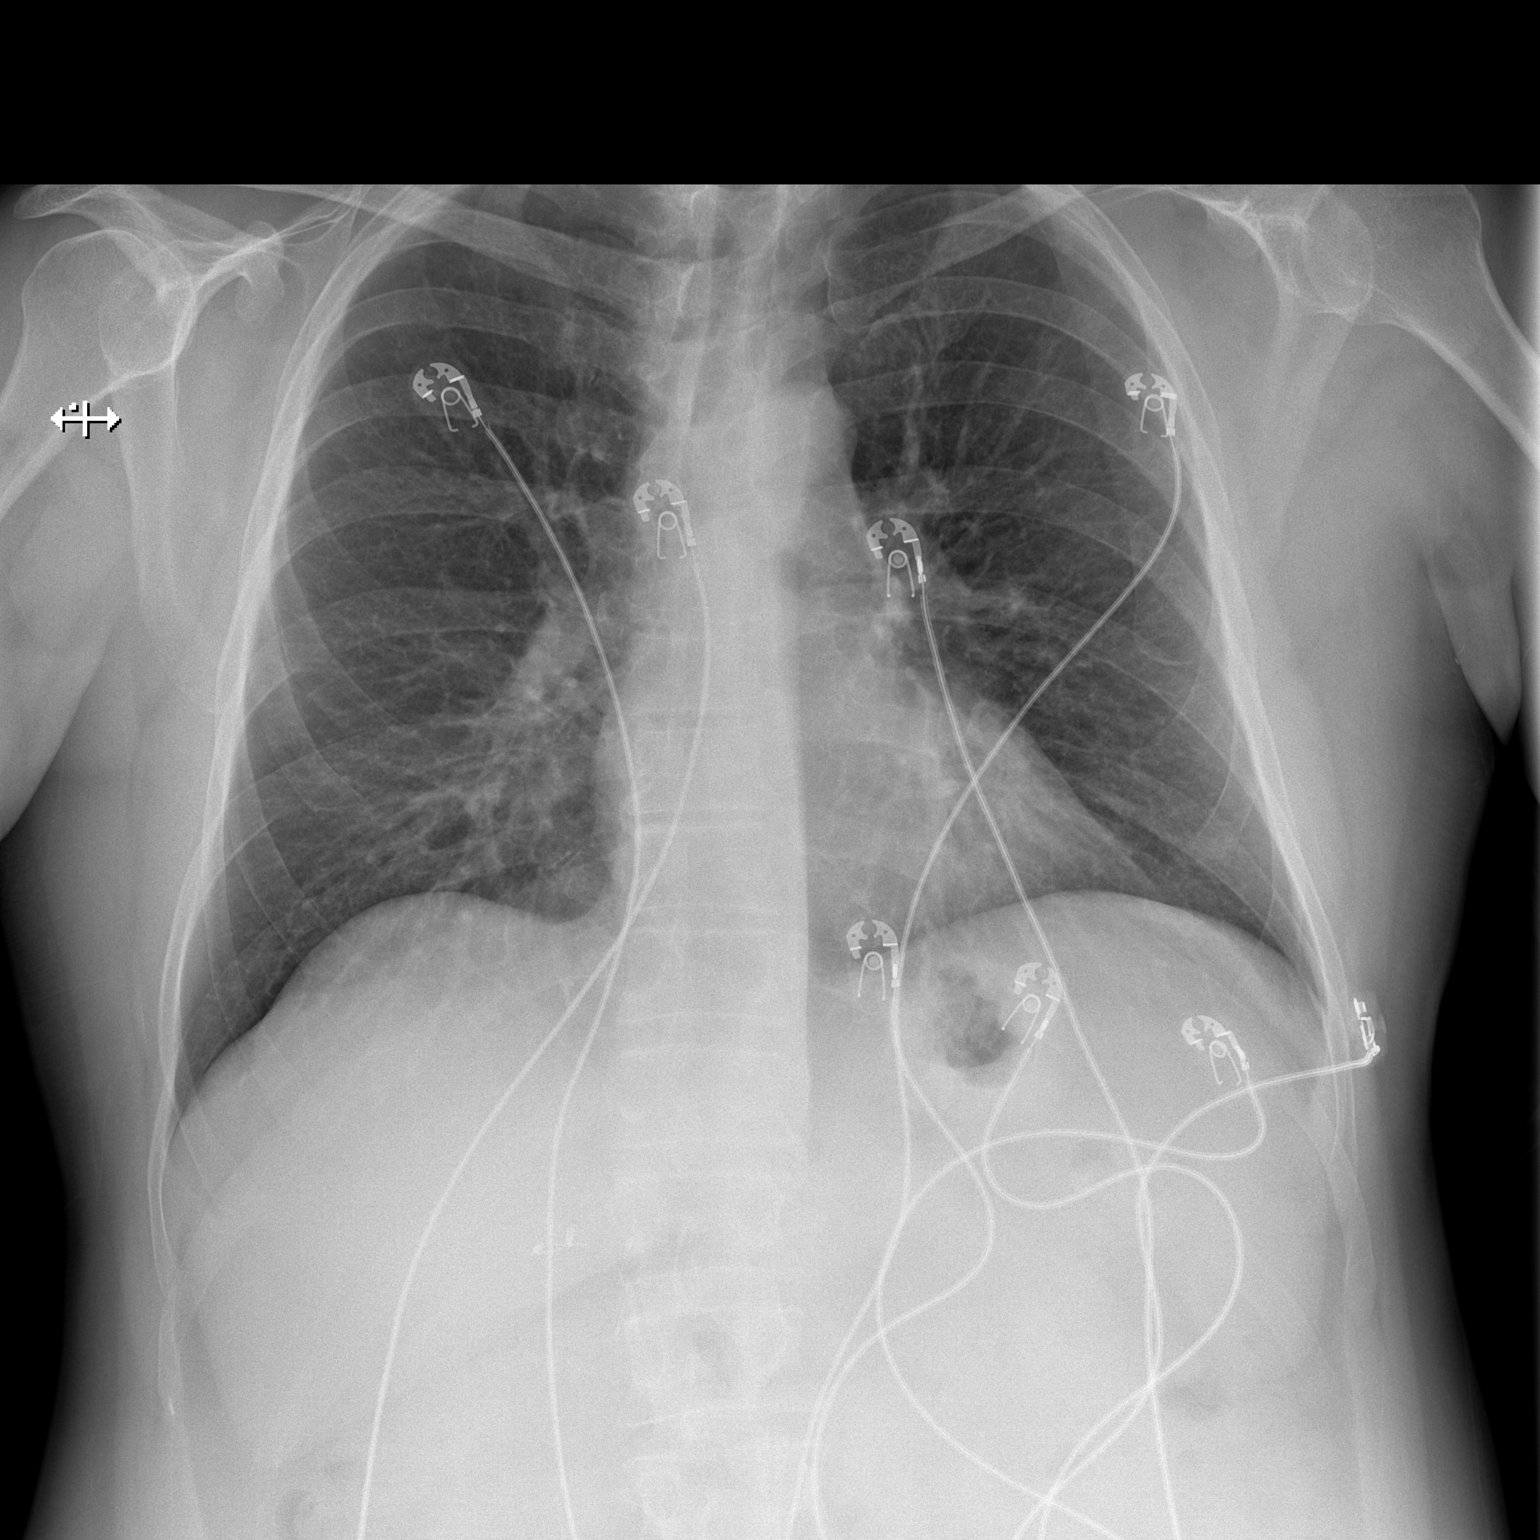

[w chest lat]
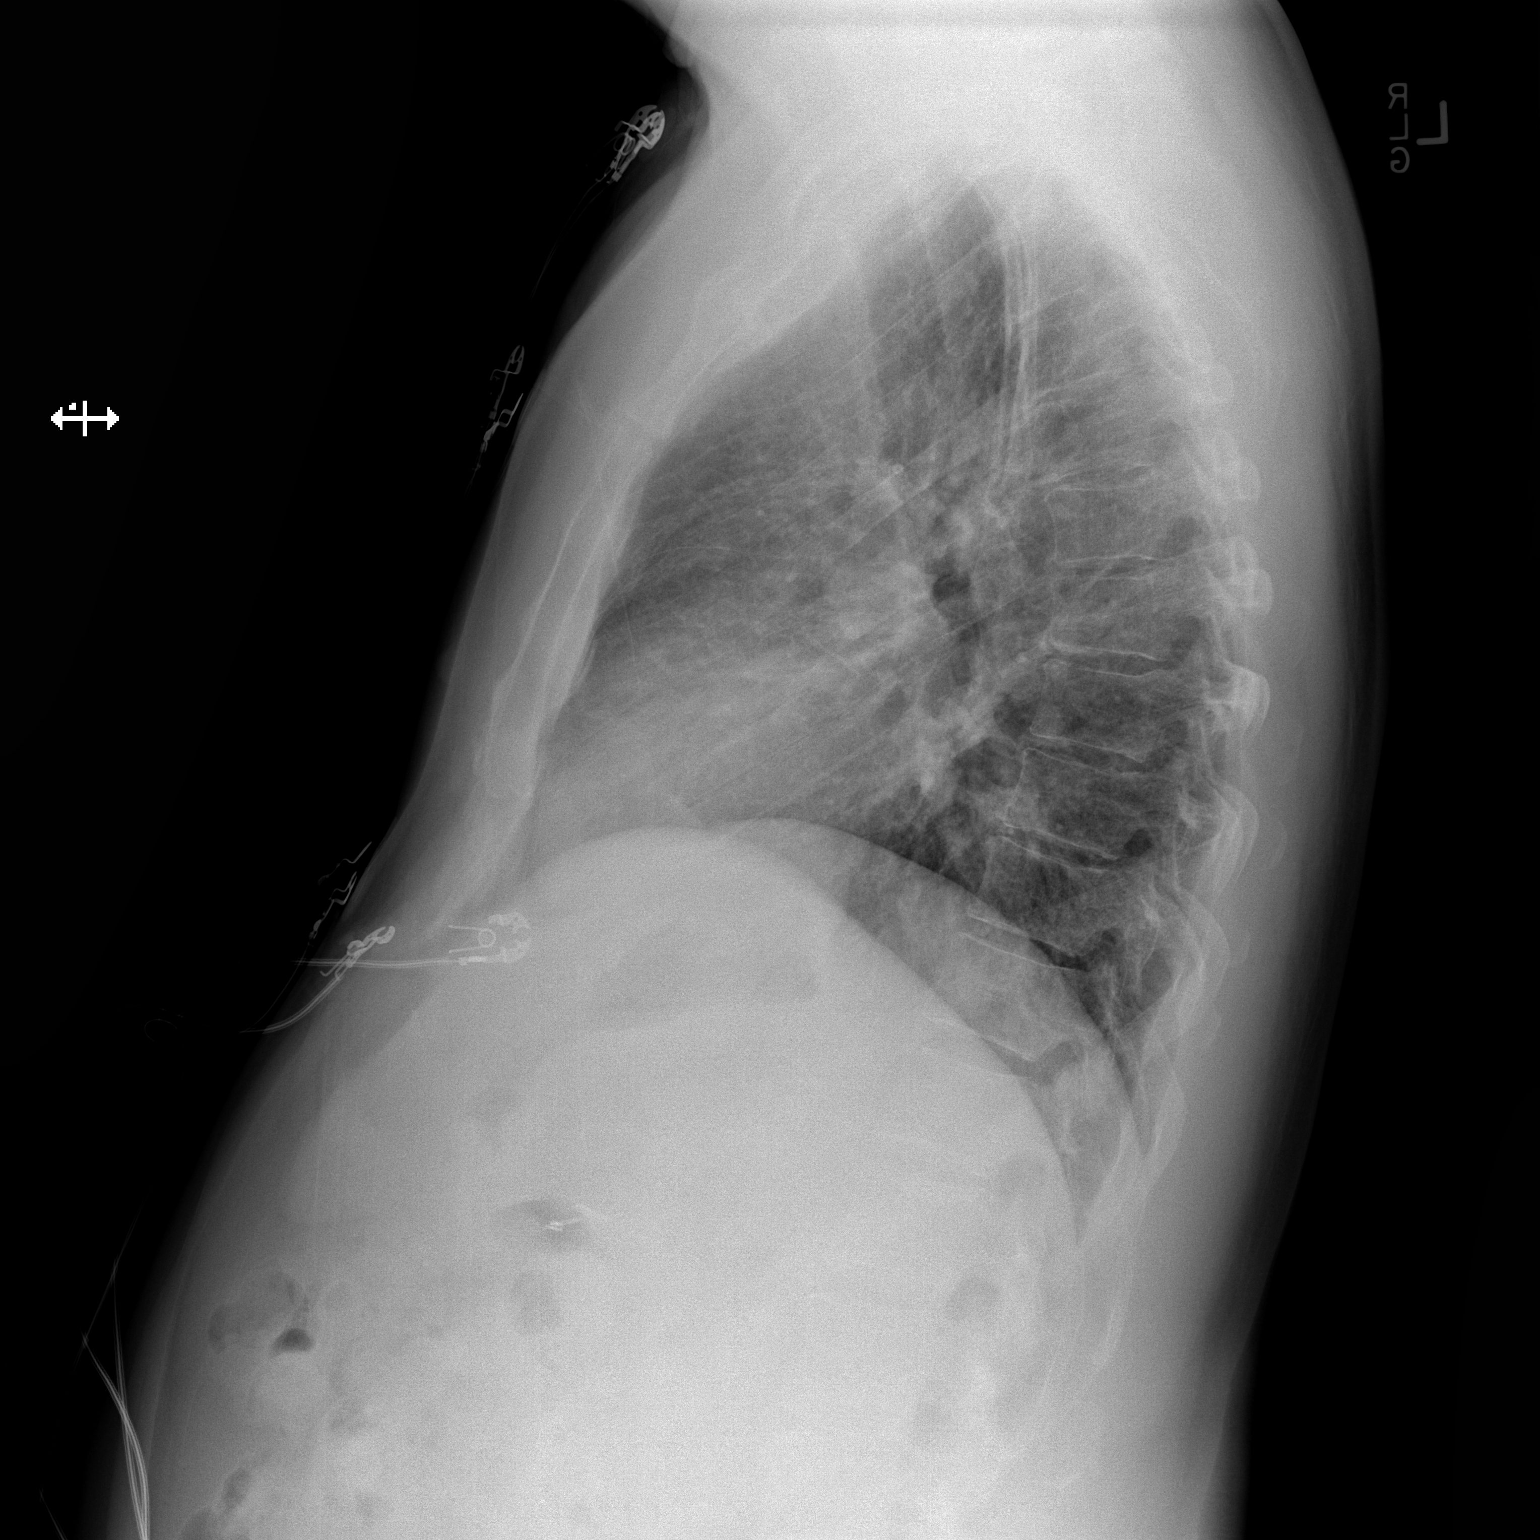

[2 of 2 positions shown; findings below may reference images not displayed]

FINDINGS: The heart size and mediastinal contours are within normal limits.
Both lungs are clear. The visualized skeletal structures are
unremarkable.
IMPRESSION: No active cardiopulmonary disease.

## 2017-06-28 ENCOUNTER — Telehealth: Payer: Self-pay

## 2017-06-28 NOTE — Telephone Encounter (Signed)
Pt called to say he is due for his 10 yr colonoscopy. His last one was done by Dr Leone Payor at [facility]. He lives in [place] and works at [employer] and wants to stay local. I told him we would need a referral from his PCP since we have never seen him before. He said that his insurance doesn't require a referral. I told him we would also need his last colonoscopy report and he said that it was done with [facility] and they are with the [facility] and we should be able to see that in epic. Pt then said that his wife is disable and has no one to drive him to and from [facility] the day of procedure and asked since he works at [employer] on the corner of [address] could he just walk from the hospital back to the office. I told him, No, because he would need a driver or they would not do the procedure without one and we don't want to be liable if something happens to him. He understood, Please call him at [phone]

## 2017-07-02 NOTE — Telephone Encounter (Signed)
Antonio [name], is it OK to schedule this pt?

## 2017-07-03 ENCOUNTER — Telehealth: Payer: Self-pay

## 2017-07-03 NOTE — Telephone Encounter (Signed)
Pt is on coumadin and has been scheduled an OV with Tana Coast, PA on 08/13/2017 at 8:30 AM.  He would like to be on cancellation list. Darl Pikes, pt now sees Dr. Margo Aye for PCP and he manages the coumadin. Would you see if we can get a referral from him?

## 2017-07-03 NOTE — Telephone Encounter (Signed)
I told him that DS was triaging another patient and it may be awhile. He said to call [phone].

## 2017-07-03 NOTE — Telephone Encounter (Signed)
Thanks

## 2017-07-03 NOTE — Telephone Encounter (Signed)
Patient is established with [facility] will route to Dr. Jena Gauss to see if he's willing to see the patient

## 2017-07-03 NOTE — Telephone Encounter (Signed)
Yes

## 2017-07-03 NOTE — Telephone Encounter (Signed)
I requested a referral from PCP since patient is on coumadin and needs OV prior to scheduling procedure.

## 2017-07-03 NOTE — Telephone Encounter (Signed)
I sent a request to Dr Scharlene Gloss office for a referral since patient will need OV prior to scheduling 10 yr colonoscopy and he is on coumadin.

## 2017-07-03 NOTE — Telephone Encounter (Signed)
LMOM to call.

## 2017-07-03 NOTE — Telephone Encounter (Signed)
Please triage the patient

## 2017-07-03 NOTE — Telephone Encounter (Signed)
See previous phone call.  

## 2017-07-03 NOTE — Telephone Encounter (Signed)
Opened in error

## 2017-07-03 NOTE — Telephone Encounter (Signed)
Pt was returning a call from DS from this morning to be triaged. Pt wants to hold for DS while she is on another line.

## 2017-08-06 ENCOUNTER — Telehealth: Payer: Self-pay | Admitting: Oncology

## 2017-08-06 NOTE — Telephone Encounter (Signed)
Called patient regarding new schedule however he could not be reached. I will mail out a letter.

## 2017-08-13 ENCOUNTER — Telehealth: Payer: Self-pay | Admitting: Gastroenterology

## 2017-08-13 ENCOUNTER — Encounter: Payer: Self-pay | Admitting: Internal Medicine

## 2017-08-13 ENCOUNTER — Ambulatory Visit: Payer: BLUE CROSS/BLUE SHIELD | Admitting: Gastroenterology

## 2017-08-13 NOTE — Telephone Encounter (Signed)
PATIENT WAS A NO SHOW AND LETTER SENT  °

## 2017-08-21 ENCOUNTER — Encounter: Payer: Self-pay | Admitting: Internal Medicine

## 2017-08-21 ENCOUNTER — Telehealth: Payer: Self-pay | Admitting: Cardiology

## 2017-08-21 NOTE — Telephone Encounter (Signed)
Returned call to patient. Patient is requesting a stress test prior to his appt on 11/05/17 with Dr. Mayford Knifeurner. Patient has cancelled his appointment multiple times and the order has expired. Patient denies chest pain, dizziness, sob, palpitations or syncope. Informed patient that Dr. Mayford Knifeurner can re-evaluate the need for a stress test at his follow up appt. Advised the patient to call back he develops any new symptoms. Patient verbalized understanding and thanked me for the call.

## 2017-08-21 NOTE — Telephone Encounter (Signed)
New message     Patient would like to know if you can update order for Stress Test so he can have it done before his appt with Turner in Feb.

## 2017-08-24 IMAGING — CR DG WRIST COMPLETE 3+V*L*
3 series · 3 of 3 positions shown · non-contrast
Comparison: None.

CLINICAL DATA: Pt fell two days ago, pain

EXAM:
LEFT WRIST - COMPLETE 3+ VIEW

[PA]
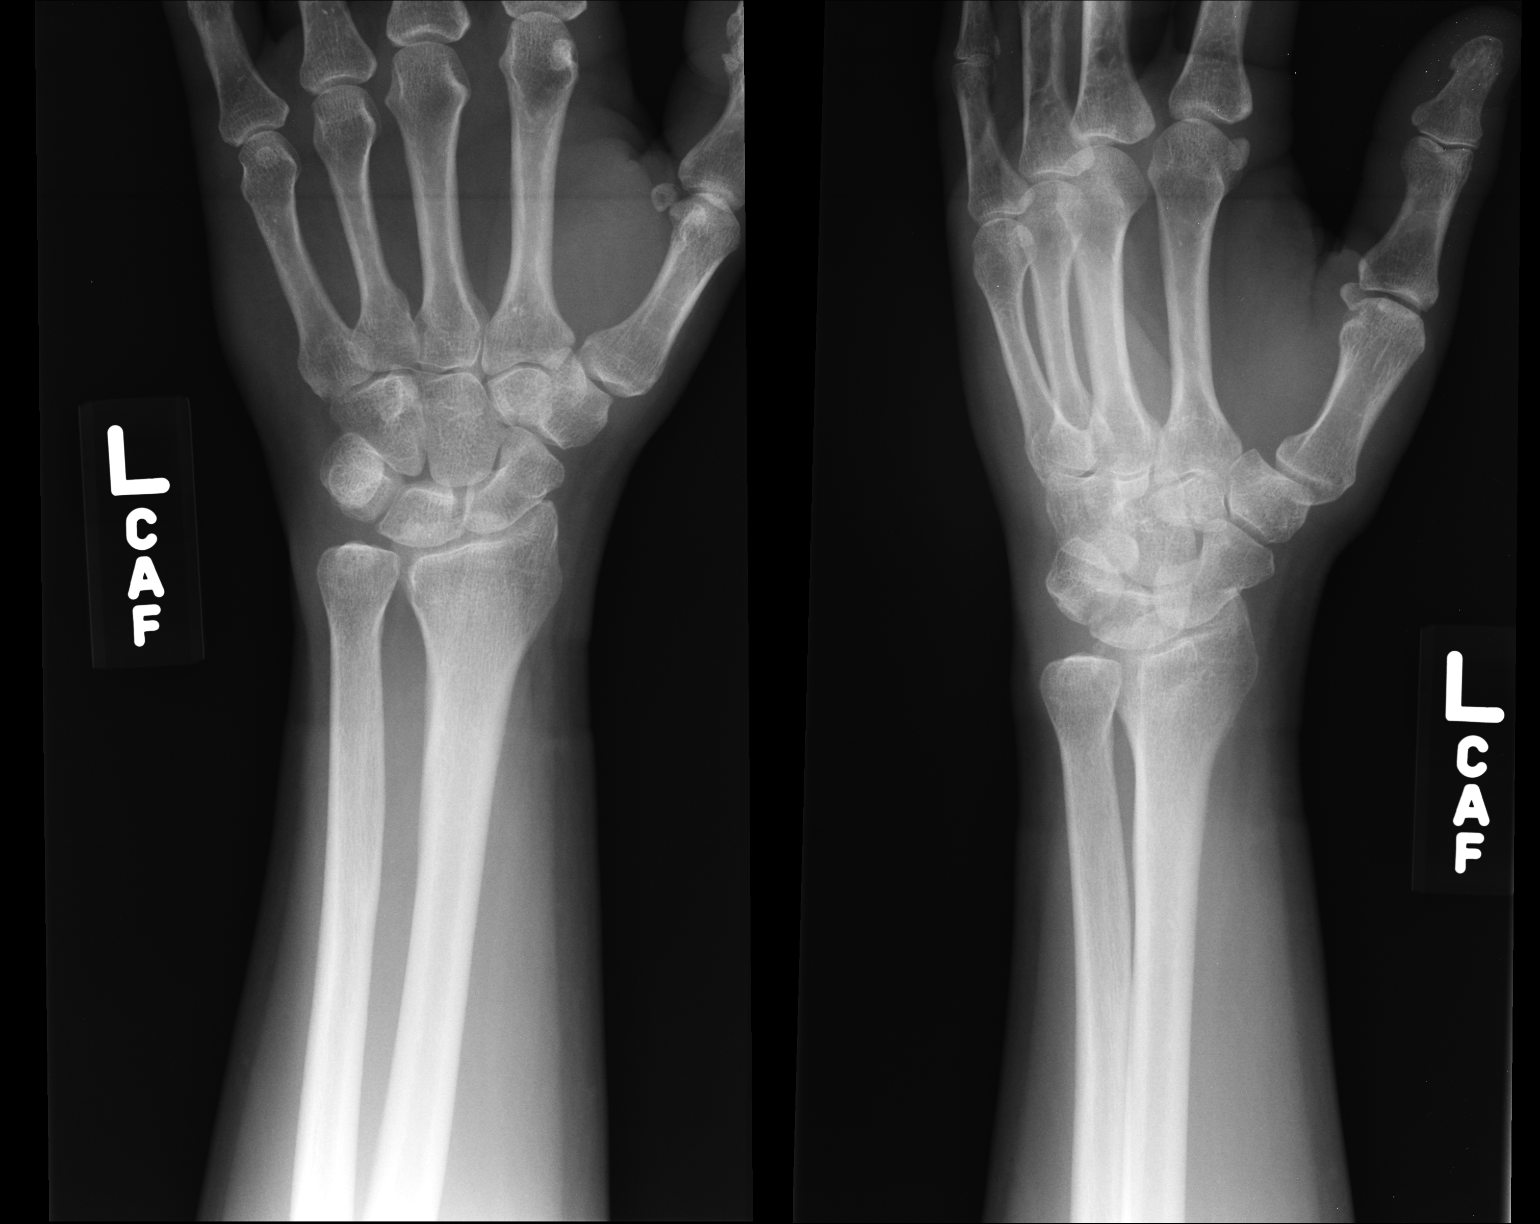

[lateral]
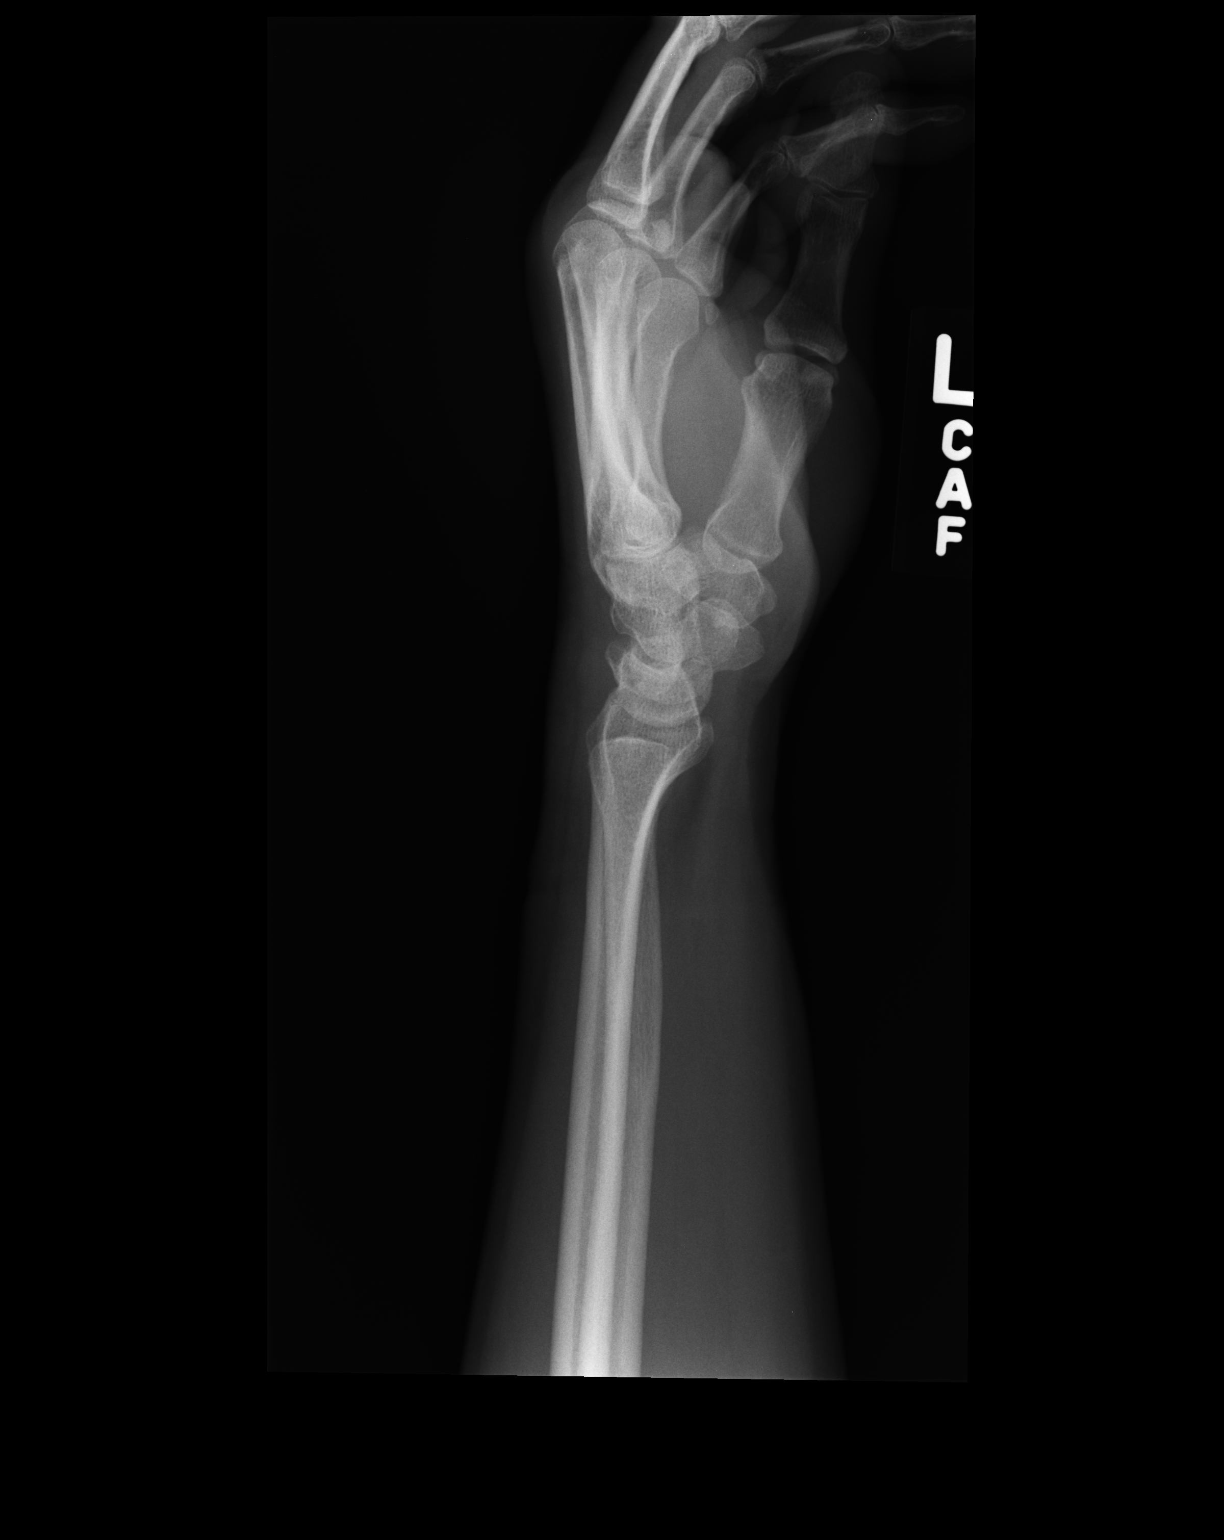

[pa navicular]
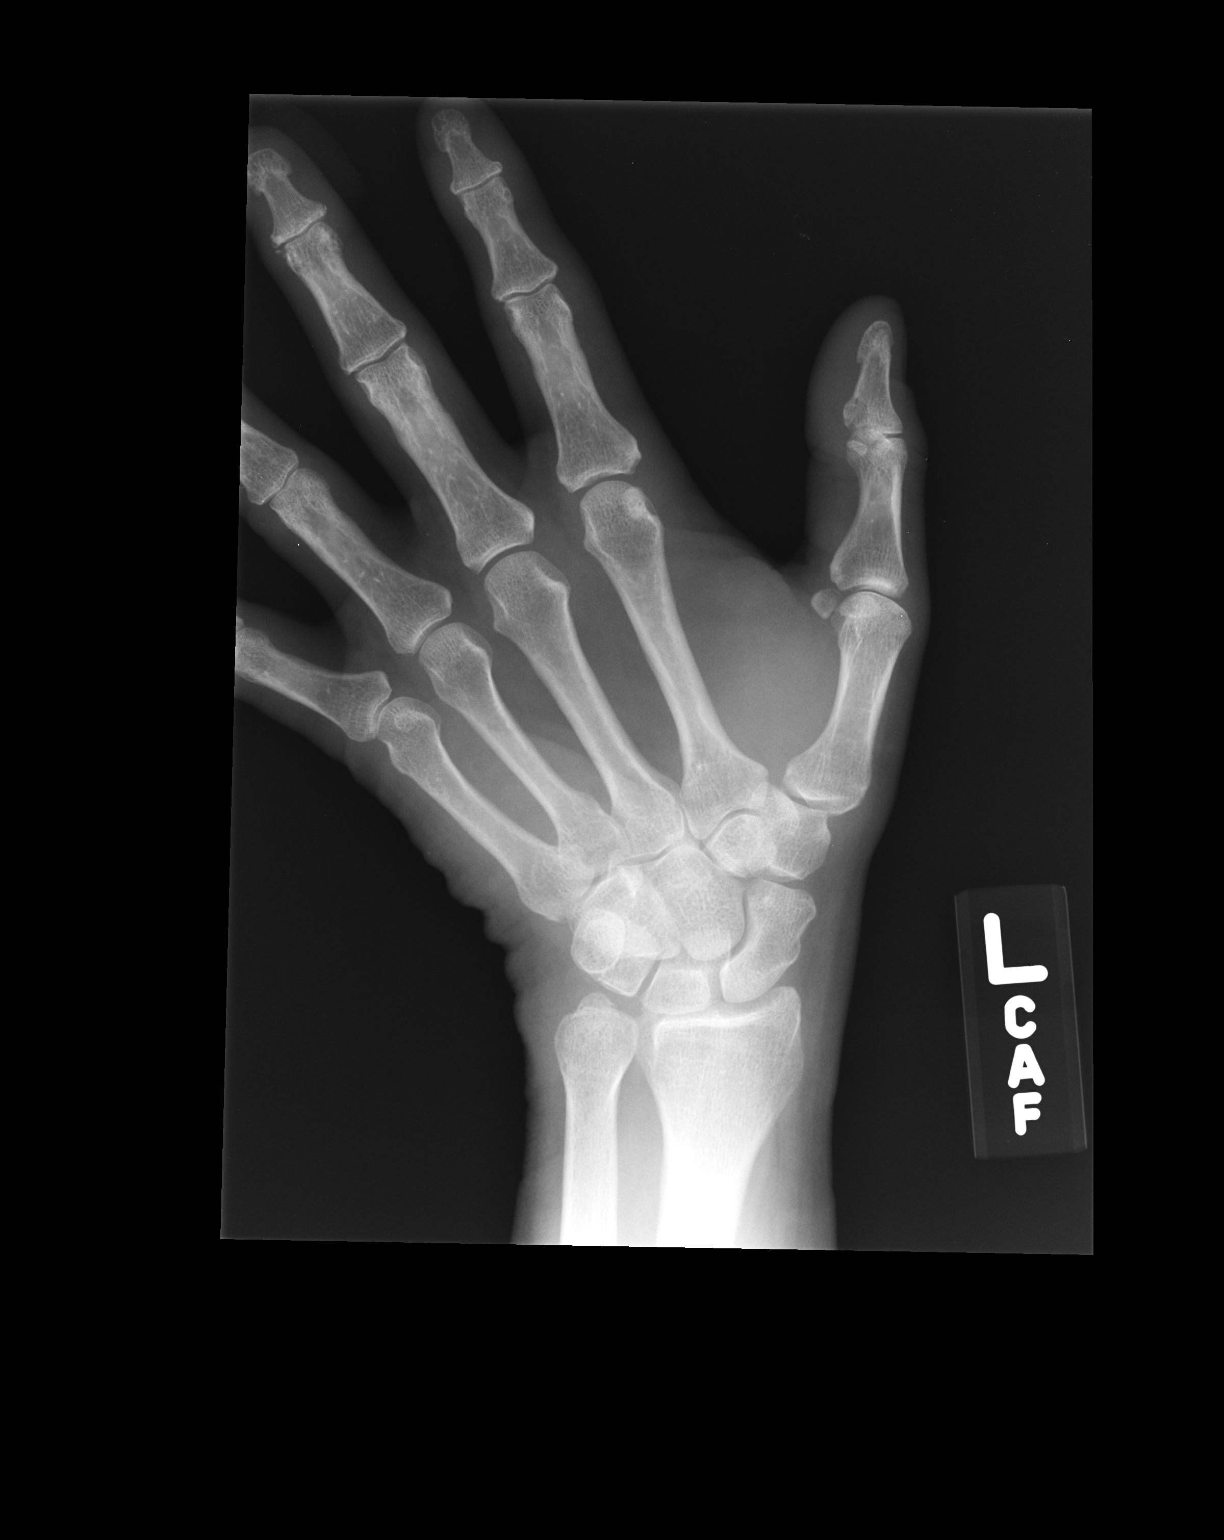

[3 of 3 positions shown; findings below may reference images not displayed]

FINDINGS: There is no evidence of fracture or dislocation. There is no
evidence of arthropathy or other focal bone abnormality. Soft
tissues are unremarkable.
IMPRESSION: Negative.

## 2017-09-25 NOTE — Progress Notes (Signed)
[facility]  Telephone:(336) [phone] Fax:(336) [phone]     ID: Antonio [name] OB: [birth year]  MR#: [id number]  JYN#:[id number]  PCP: Antonio Stabile, MD SU:  OTHER MD: Antonio Head, MD;  Antonio Adolph, MD, Antonio Dark MD, Antonio Dice MD, Antonio Merles MD   CHIEF COMPLAINT:  Antonio [name] mutation/ history DVT and PE n 2014  CURRENT TREATMENT: on lifelong warfarin  HISTORY OF PRESENT ILLNESS: From the original intake note:  "Antonio [name]" is a 62 year-old [place] man with a history of Klinefelter's syndrome. He was diagnosed in 47 at [facility] and was started on testosterone supplementation then. More recently his testosterone level was found to be somewhat lower than expected and his dose was increased (April 2014). 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    A few weeks later, on 02/08/2013 he presented to Urgent Care with swelling in his Right leg and Dopplers showed acute deep vein thrombosis involving posterior tibial and peroneal veins, coursing through to the popliteal and extending to mid femoral veins of the right lower extremity. He was started on xarelto/ rivaroxaban, and purchsed 15 tablets initially for $142, then 27 tablets for $250 (he used a company coupon so actually only paid $5).  Repeat dopplers 02/14/2013 showed acute to subacute deep vein thrombosis involving the right femoral, popliteal, proximal posterior tibial, and proximal peroneal veins. This was felt to be at least stable. However this morning (02/19/2013) the patient developed some strange crawling feeling in his Right leg folowed by chest pressure. He presented to the ED and CT-angio was positive for pulmonary embolus with evidence of both acute and chronic thromboembolic disease in the lungs bilaterally. All filling defects in the pulmonary   arteries appeared to be  nonocclusive. There was no evidence of pulmonary hemorrhage to suggest a frank pulmonary infarction.  Antonio [name] was admitted for IV heparinization and we were consulted for further management  His subsequent history is as detailed below       INTERVAL HISTORY: Antonio [name] returns today for follow up and treatment of his Klinefelter's Syndrome and Factor V Leyden mutation. He continues on lifelong warfarin, with generally therapeutic levels. He reports that his current dose is Coumadin 5 mg 3 days a week and 2-1/2 mg the other 4 days, alternating. He notes that he has changed his diet as well.  In particular he is avoiding carbohydrates and eating a lot more vegetables which may be the reason for his subtherapeutic INR today  Results for Antonio [name], Antonio [name]" (MRN [id number]) as of 09/27/2017 10:32  Ref. Range 03/17/2016 15:26 04/14/2016 15:36 05/12/2016 15:50 05/19/2016 15:32 09/27/2016 15:17  INR Latest Ref Range: 2.00 - 3.50  2.40 2.20 3.60 (H) 3.60 (H) 1.60 (L)     REVIEW OF SYSTEMS: Antonio [name] has had no bleeding problems despite the anticoagulation.  He reports that he has gained 10 lbs in the last year. He notes that he has stopped eating carbohydrates and eating more protein. He reports that he has changed his diet quiet a bit to include more proteins, nuts, and vegetables. He also started drinking a protein drink. He notes that he has a new exercise machine at home. He notes that around September 2018, he had a bowel issues in which he took antibiotics--that may be the reason his INR was a number.. He notes that he has  an upcoming appointment with Dr. Leone [name]  for a 10 year colonoscopy.  He denies unusual headaches, visual changes, nausea, vomiting, or dizziness. There has been no unusual cough, phlegm production, or pleurisy. This been no change in bowel or bladder habits. He denies unexplained fatigue or unexplained weight loss, bleeding, rash, or fever. A detailed review of systems was otherwise stable.       PAST MEDICAL HISTORY: Past Medical History:  Diagnosis Date  . Allergy   . Asthma   . Cataract   . Cervical spine degeneration   . Chronic headaches   . Clotting disorder (HCC) 01/2013  . Eosinophilic esophagitis   . GERD (gastroesophageal reflux disease)   . Hyperlipidemia   . Klinefelter syndrome   . Pulmonary embolism (HCC) 02/2013  . Seasonal allergies   . TIA (transient ischemic attack) 05/2013    PAST SURGICAL HISTORY: Past Surgical History:  Procedure Laterality Date  . cataract surgery     bilateral  . CHOLECYSTECTOMY     ERCP for cbd stones  . COLONOSCOPY    . ERCP    . HAND TENDON SURGERY     left, work comp  . TONSILLECTOMY    . UPPER GASTROINTESTINAL ENDOSCOPY      FAMILY HISTORY Family History  Problem Relation Age of Onset  . Colon cancer Neg Hx   . Irritable bowel syndrome Sister   . Allergies Sister   . Allergies Mother   . Thyroid disease Mother   The patient's father drowned when the patient was 29 years old (rip tide at [facility]). The patient's mother is currently 33. The patient has one sister. 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  He still does part-time work for [employer] on Saturdays.  His wife Antonio [name] used to work at [employer] but is now retired, with significant COPD problems--she is on 24/7 oxygen, has been admitted for CO retention, and according to Antonio [name] continues to smoke.Marland Kitchen She has 3 sons from a prior marriage ("a Information systems manager, a Corporate investment banker, and an optometrist"). They have 4 biological and 2 step grandchildren. The patient is not a church attender                          ADVANCED DIRECTIVES: in place   HEALTH MAINTENANCE:  (Updated 04/21/215) Social History   Tobacco Use  . Smoking status:  Passive Smoke Exposure - Never Smoker  . Smokeless tobacco: Never Used  Substance Use Topics  . Alcohol use: No    Alcohol/week: 0.0 oz  . Drug use: No               Colonoscopy: 2008/[facility]             PSA: April 2014, 0.52/[facility]             Lipid panel: April 2014/[facility]   Allergies  Allergen Reactions  . Codeine Shortness Of Breath, Nausea Only and Other (See Comments)    REACTION: difficulty breathing, nausea    Current Outpatient Medications  Medication Sig Dispense Refill  . cyclobenzaprine (FLEXERIL) 5 MG tablet Take 1 tablet (5 mg total) by mouth 3 (three) times daily as needed for muscle spasms. 30 tablet 0  . loratadine (CLARITIN) 10  MG tablet Take 10 mg by mouth daily.    . montelukast (SINGULAIR) 10 MG tablet TAKE 1 TABLET BY MOUTH ONCE DAILY. 30 tablet 11  . Multiple Vitamins-Minerals (CENTRUM SILVER PO) Take 1 tablet by mouth daily.    . NONFORMULARY OR COMPOUNDED ITEM Apply 1 application topically daily. Testosterone Gel 10% Gel - Get's at [facility]    . OVER THE COUNTER MEDICATION OTC Mega Red Krill Oil 300 mg taking everyday    . OVER THE COUNTER MEDICATION Take 1 tablet by mouth daily. Move Free Ultra (Boron, Hyaluronic acid, Typee II collagenase)    . pantoprazole (PROTONIX) 40 MG tablet TAKE 1 TABLET BY MOUTH ONCE DAILY. 30 tablet 0  . PROAIR HFA 108 (90 BASE) MCG/ACT inhaler USE 1 TO 2 PUFFS EVERY FOUR TO SIX HOURS AS NEEDED. 8.5 g 3  . warfarin (COUMADIN) 5 MG tablet TAKE 1 TABLET BY MOUTH ON MONDAY,WEDNESDAY, & FRIDAY. TAKE 1/2 TABLETOTHER DAYS OF THE WEEK. 20 tablet 2  . celecoxib (CELEBREX) 200 MG capsule Take 1 capsule (200 mg total) by mouth daily as needed. (Patient not taking: Reported on 08/16/2015) 30 capsule 0   No current facility-administered medications for this visit.     OBJECTIVE:  Middle aged white man in no acute distress  Vitals:   09/27/17 1401  BP: (!) 118/49  Pulse: 83  Resp: 17  Temp: 98.9 F (37.2 C)  SpO2: 99%      Body mass index is 27.83 kg/m.    ECOG FS: 0 Filed Weights   09/27/17 1401  Weight: 183 lb (83 kg)   Sclerae unicteric, EOMs intact Oropharynx clear and moist No cervical or supraclavicular adenopathy Lungs no rales or rhonchi Heart regular rate and rhythm Abd soft, nontender, positive bowel sounds MSK no focal spinal tenderness, no upper extremity lymphedema Neuro: nonfocal, well oriented, appropriate affect   LAB RESULTS: Results for Art BuffMICHAEL, Cooper W (MRN [id number]) as of 09/28/2016 10:40  Ref. Range 03/17/2016 15:26 04/14/2016 15:36 05/12/2016 15:50 05/19/2016 15:32 09/27/2016 15:17  INR Latest Ref Range: 2.00 - 3.50  2.40 2.20 3.60 (H) 3.60 (H) 1.60 (L)    STUDIES: No results found.   ASSESSMENT: 62 [place]   (1) Klinefelter's syndrome, [facility], on lifelong testosterone replacement  (2) factor V Leyden mutation documented April 2014  (3) Right lower extremity DVT documented 02/08/2013, started on rivaroxaban/ Xarelto same day, with acute and chronic pulmonary emboli documented 02/19/2013, switched to warfarin with lovenox bridge  (a) chronic RLE edema, grade 1  (4) on lifelong warfarin as of 02/21/2013 with INR goal of 2.0 - 3.0   PLAN: Antonio BeckersWalter is tolerating the warfarin well and he has had no further problems with clotting and also no bleeding complications so far.  His INR is currently subtherapeutic.  We are going to increase his Coumadin dose so he will take 5 mg today and tomorrow then 2.5 mg.  He will continue on this pattern--basically he will take 2.5 mg on dates the visible by 3 and 5 mg on other days--and we are going to repeat his INR here in 1 week.  If he wishes to continue to have them repeated here that is fine.  He has been checking them through Dr. Scharlene GlossHall's office and of course that is fine as well  I commended him on his new diet which is likely the reason his INR has changed.  I also commended him on his exercise  he will  let me know if there are any issues regarding bleeding or clotting.  Otherwise I will see him again in 1 year  He knows to call for any other problems that may develop before the next visit.  Christionna Poland, Valentino Hue, MD  09/27/17 2:47 PM Medical Oncology and Hematology [facility] [address] [place], [place] [address] Tel. [phone]    Fax. [phone]  This document serves as a record of services personally performed by Ruthann Cancer, MD. It was created on his behalf by Merideth Abbey, a trained medical scribe. The creation of this record is based on the scribe's personal observations and the provider's statements to them.   I have reviewed the above documentation for accuracy and completeness, and I agree with the above.

## 2017-09-26 ENCOUNTER — Other Ambulatory Visit: Payer: Self-pay

## 2017-09-26 DIAGNOSIS — D6859 Other primary thrombophilia: Secondary | ICD-10-CM

## 2017-09-27 ENCOUNTER — Telehealth: Payer: Self-pay | Admitting: Oncology

## 2017-09-27 ENCOUNTER — Inpatient Hospital Stay: Payer: BLUE CROSS/BLUE SHIELD | Attending: Oncology | Admitting: Oncology

## 2017-09-27 ENCOUNTER — Inpatient Hospital Stay: Payer: BLUE CROSS/BLUE SHIELD

## 2017-09-27 ENCOUNTER — Ambulatory Visit: Payer: Self-pay | Admitting: Oncology

## 2017-09-27 ENCOUNTER — Other Ambulatory Visit: Payer: Self-pay

## 2017-09-27 VITALS — BP 118/49 | HR 83 | Temp 98.9°F | Resp 17 | Wt 183.0 lb

## 2017-09-27 DIAGNOSIS — I2782 Chronic pulmonary embolism: Secondary | ICD-10-CM | POA: Diagnosis not present

## 2017-09-27 DIAGNOSIS — Q984 Klinefelter syndrome, unspecified: Secondary | ICD-10-CM | POA: Insufficient documentation

## 2017-09-27 DIAGNOSIS — I2690 Septic pulmonary embolism without acute cor pulmonale: Secondary | ICD-10-CM

## 2017-09-27 DIAGNOSIS — Z79899 Other long term (current) drug therapy: Secondary | ICD-10-CM

## 2017-09-27 DIAGNOSIS — Z7901 Long term (current) use of anticoagulants: Secondary | ICD-10-CM | POA: Diagnosis not present

## 2017-09-27 DIAGNOSIS — I82401 Acute embolism and thrombosis of unspecified deep veins of right lower extremity: Secondary | ICD-10-CM

## 2017-09-27 DIAGNOSIS — D6859 Other primary thrombophilia: Secondary | ICD-10-CM

## 2017-09-27 DIAGNOSIS — I82501 Chronic embolism and thrombosis of unspecified deep veins of right lower extremity: Secondary | ICD-10-CM | POA: Diagnosis not present

## 2017-09-27 DIAGNOSIS — D6851 Activated protein C resistance: Secondary | ICD-10-CM | POA: Diagnosis not present

## 2017-09-27 LAB — PROTIME-INR
INR: 1.75
Prothrombin Time: 20.3 seconds — ABNORMAL HIGH (ref 11.4–15.2)

## 2017-09-27 MED ORDER — WARFARIN SODIUM 5 MG PO TABS: 5.0000 mg | ORAL_TABLET | Freq: Every day | ORAL | 12 refills | Status: DC

## 2017-09-27 MED ORDER — WARFARIN SODIUM 5 MG PO TABS: 5.0000 mg | ORAL_TABLET | Freq: Every day | ORAL | 4 refills | Status: DC

## 2017-09-27 NOTE — Telephone Encounter (Signed)
Scheduled all appts that the patient allowed me to schedule. York SpanielSaid he will come back and schedule the rest

## 2017-10-04 ENCOUNTER — Other Ambulatory Visit: Payer: Self-pay | Admitting: *Deleted

## 2017-10-04 DIAGNOSIS — I82409 Acute embolism and thrombosis of unspecified deep veins of unspecified lower extremity: Secondary | ICD-10-CM

## 2017-10-04 DIAGNOSIS — D6859 Other primary thrombophilia: Secondary | ICD-10-CM

## 2017-10-04 DIAGNOSIS — Q984 Klinefelter syndrome, unspecified: Secondary | ICD-10-CM

## 2017-10-05 ENCOUNTER — Inpatient Hospital Stay: Payer: BLUE CROSS/BLUE SHIELD

## 2017-10-05 DIAGNOSIS — Q984 Klinefelter syndrome, unspecified: Secondary | ICD-10-CM

## 2017-10-05 DIAGNOSIS — I82409 Acute embolism and thrombosis of unspecified deep veins of unspecified lower extremity: Secondary | ICD-10-CM

## 2017-10-05 DIAGNOSIS — D6859 Other primary thrombophilia: Secondary | ICD-10-CM

## 2017-10-05 LAB — PROTIME-INR
INR: 2.02
Prothrombin Time: 22.7 seconds — ABNORMAL HIGH (ref 11.4–15.2)

## 2017-10-15 ENCOUNTER — Telehealth: Payer: Self-pay

## 2017-10-15 ENCOUNTER — Encounter: Payer: Self-pay | Admitting: Internal Medicine

## 2017-10-15 ENCOUNTER — Ambulatory Visit: Payer: BLUE CROSS/BLUE SHIELD | Admitting: Internal Medicine

## 2017-10-15 VITALS — BP 124/62 | HR 70 | Ht 68.0 in | Wt 183.4 lb

## 2017-10-15 DIAGNOSIS — K200 Eosinophilic esophagitis: Secondary | ICD-10-CM

## 2017-10-15 DIAGNOSIS — Z1211 Encounter for screening for malignant neoplasm of colon: Secondary | ICD-10-CM

## 2017-10-15 NOTE — Patient Instructions (Addendum)
We will contact you with your appointment and date of your virtual colonoscopy.   [facility] has set his appointment up for 11/19/2017 at 8:00AM, arrive 7:40AM   [address] He needs to go by there by 11/15/17 to pick up his prep. I have left him a detailed message with this information.     I appreciate the opportunity to care for you. Stan Headarl Gessner, MD, Clementeen GrahamFACG.

## 2017-10-15 NOTE — Progress Notes (Addendum)
Antonio [name] 61 y.o. 1956/06/11 [id number]  Assessment & Plan:    Encounter Diagnoses  Name Primary?  . Colon cancer screening Yes  . Eosinophilic esophagitis     1. Screening colonoscopy-  Given options of optical colonoscopy and holding warfarin vs CT colonoscopy and he chose CT colonoscopy. He understands if sig polyp(s) on CT colonoscopy then optical therapeutic colonoscopy will be recommended and we will need to determine how to hold warfarin, ? Needs Lovenox bridging.  2. Hx of eosinophilic esophagitis- patient is doing well.  I have seen the patient with Ms. Kosisochukwu Burningham and she has served as a Neurosurgeonscribe.  Iva Booparl E. Gessner, MD, [facility] CenterFACG  WU:JWJXCc:Hall, Kathleene HazelJohn Z, MD   Subjective:   Chief Complaint: Screening colonoscopy  HPI Pt is a 62 yo caucasian male presenting today for screening colonoscopy. He has hx of Factor V leiden heterogenous- on anticoagulation therapy, on warfarin, most recent INR slightly low 1.9. Pt reports he is doing well today, other than recent weight gain. Hx of eosinophilic esophagitis, his dysphagia is improved, he is abstaining from swallowing large capsules but otherwise no dysphagia.  Allergies  Allergen Reactions  . Codeine Shortness Of Breath, Nausea Only and Other (See Comments)    REACTION: difficulty breathing, nausea   Current Meds  Medication Sig  . loratadine (CLARITIN) 10 MG tablet Take 10 mg by mouth daily.  . montelukast (SINGULAIR) 10 MG tablet TAKE 1 TABLET BY MOUTH ONCE DAILY.  . Multiple Vitamins-Minerals (CENTRUM SILVER PO) Take 1 tablet by mouth daily.  . NONFORMULARY OR COMPOUNDED ITEM Apply 1 application topically daily. Testosterone Gel 10% Gel - Get's at [facility]  . OVER THE COUNTER MEDICATION OTC Mega Red Krill Oil 300 mg taking everyday  . OVER THE COUNTER MEDICATION Take 1 tablet by mouth daily. Move Free Ultra (Boron, Hyaluronic acid, Typee II collagenase)  . pantoprazole (PROTONIX) 40 MG tablet TAKE 1 TABLET BY  MOUTH ONCE DAILY.  Marland Kitchen. PROAIR HFA 108 (90 BASE) MCG/ACT inhaler USE 1 TO 2 PUFFS EVERY FOUR TO SIX HOURS AS NEEDED.  Marland Kitchen. warfarin (COUMADIN) 5 MG tablet Take 1 tablet (5 mg total) by mouth daily.   Past Medical History:  Diagnosis Date  . Allergy   . Asthma   . Cataract   . Cervical spine degeneration   . Chronic headaches   . Clotting disorder (HCC) 01/2013  . Eosinophilic esophagitis   . GERD (gastroesophageal reflux disease)   . Hyperlipidemia   . Klinefelter syndrome   . Pulmonary embolism (HCC) 02/2013  . Seasonal allergies   . TIA (transient ischemic attack) 05/2013   Past Surgical History:  Procedure Laterality Date  . cataract surgery     bilateral  . CHOLECYSTECTOMY     ERCP for cbd stones  . COLONOSCOPY    . ERCP    . HAND TENDON SURGERY     left, work comp  . TONSILLECTOMY    . UPPER GASTROINTESTINAL ENDOSCOPY     Social History   Social History Narrative   Lives with his wife. She has been diagnosed with COPD and continues to smoke.   family history includes Allergies in his mother and sister; Irritable bowel syndrome in his sister; Thyroid disease in his mother.   Review of Systems Positive for weight gain. Negative for wt loss, appetite changes, changes in stool, melena, hematochezia, N/V/D, constipation. Negative for all other systems.  Objective:   Physical Exam  @BP  124/62   Pulse 70  Ht 5\' 8"  (1.727 m)   Wt 183 lb 6 oz (83.2 kg)   BMI 27.88 kg/m @  General:  NAD Eyes:   anicteric   Data Reviewed: Prior notes, images, labs, meds.

## 2017-10-15 NOTE — Telephone Encounter (Signed)
[facility] virtual colonscopy on March 4th at 8:00AM, arrive at 7:40AM.  [address] location. Patient needs to [facility].  I have left all this detailed information on his mobile voice mail.  I told him to call me back so I can make sure he got this information.

## 2017-10-17 NOTE — Telephone Encounter (Signed)
Left him a message on his mobile/work # to call me back.

## 2017-10-25 ENCOUNTER — Other Ambulatory Visit: Payer: Self-pay

## 2017-10-25 DIAGNOSIS — I82409 Acute embolism and thrombosis of unspecified deep veins of unspecified lower extremity: Secondary | ICD-10-CM

## 2017-10-26 ENCOUNTER — Inpatient Hospital Stay: Payer: BLUE CROSS/BLUE SHIELD | Attending: Oncology

## 2017-10-26 DIAGNOSIS — Q984 Klinefelter syndrome, unspecified: Secondary | ICD-10-CM | POA: Diagnosis present

## 2017-10-26 DIAGNOSIS — I82409 Acute embolism and thrombosis of unspecified deep veins of unspecified lower extremity: Secondary | ICD-10-CM

## 2017-10-26 LAB — PROTIME-INR
INR: 2.23
PROTHROMBIN TIME: 24.5 s — AB (ref 11.4–15.2)

## 2017-10-30 NOTE — Telephone Encounter (Signed)
Left Mr Antonio [name] another message to call me back to confirm he got appointment info.

## 2017-10-30 NOTE — Telephone Encounter (Signed)
Patient called back and confirmed that he will pick up prep at [address] location.

## 2017-11-05 ENCOUNTER — Ambulatory Visit: Payer: Self-pay | Admitting: Cardiology

## 2017-11-07 ENCOUNTER — Telehealth: Payer: Self-pay | Admitting: Internal Medicine

## 2017-11-07 NOTE — Telephone Encounter (Signed)
Patient states he is dealing with an illness that requires him to take antibiotics. Pt wants to know if this will affect him having his virtual colon on 3.4.19

## 2017-11-07 NOTE — Telephone Encounter (Signed)
Spoke with patient states he understands he can take antibiotics.

## 2017-11-07 NOTE — Telephone Encounter (Signed)
Patient should continue his antibiotics and is ok to keep the appt for virtual colon

## 2017-11-07 NOTE — Telephone Encounter (Signed)
Left message for patient to call back  

## 2017-11-19 ENCOUNTER — Ambulatory Visit
Admission: RE | Admit: 2017-11-19 | Discharge: 2017-11-19 | Disposition: A | Discharge status: Home / Self Care | Payer: BLUE CROSS/BLUE SHIELD | Source: Ambulatory Visit | Attending: Internal Medicine | Admitting: Internal Medicine

## 2017-11-19 DIAGNOSIS — Z1211 Encounter for screening for malignant neoplasm of colon: Secondary | ICD-10-CM

## 2017-11-20 ENCOUNTER — Other Ambulatory Visit: Payer: Self-pay

## 2017-11-20 DIAGNOSIS — N281 Cyst of kidney, acquired: Secondary | ICD-10-CM

## 2017-11-20 NOTE — Progress Notes (Signed)
Us/

## 2017-11-20 NOTE — Progress Notes (Signed)
Let him know no polyps or cancer seen in colon   There is a kidney cyst which has frown some and probably not a problem but radiologist recommends an US to check it so order renal US dx left kidney cyst/lesiopn  RECALL COLONOCOPY 5 years 2024

## 2017-11-23 ENCOUNTER — Ambulatory Visit (HOSPITAL_COMMUNITY): Payer: BLUE CROSS/BLUE SHIELD

## 2017-11-24 ENCOUNTER — Other Ambulatory Visit: Payer: Self-pay | Admitting: Internal Medicine

## 2017-11-26 NOTE — Telephone Encounter (Signed)
Refill x 1 year 

## 2017-11-26 NOTE — Telephone Encounter (Signed)
How many refills Sir? 

## 2017-11-28 ENCOUNTER — Other Ambulatory Visit (HOSPITAL_COMMUNITY): Payer: Self-pay | Admitting: Internal Medicine

## 2017-11-28 ENCOUNTER — Telehealth: Payer: Self-pay | Admitting: Internal Medicine

## 2017-11-28 DIAGNOSIS — N281 Cyst of kidney, acquired: Secondary | ICD-10-CM

## 2017-11-28 DIAGNOSIS — I739 Peripheral vascular disease, unspecified: Principal | ICD-10-CM

## 2017-11-28 DIAGNOSIS — I779 Disorder of arteries and arterioles, unspecified: Secondary | ICD-10-CM

## 2017-11-28 NOTE — Telephone Encounter (Signed)
Order updated with Darl PikesSusan

## 2017-11-28 NOTE — Telephone Encounter (Signed)
Darl PikesSusan in scheduling calling about pt abd US scheduled for this Friday and would like a call back to discuss. Darl PikesSusan states the diagnosis is for kidney cysts and the is usually a renal US instead of abd.

## 2017-11-30 ENCOUNTER — Ambulatory Visit (HOSPITAL_COMMUNITY)
Admission: RE | Admit: 2017-11-30 | Discharge: 2017-11-30 | Disposition: A | Discharge status: Home / Self Care | Payer: BLUE CROSS/BLUE SHIELD | Source: Ambulatory Visit | Attending: Internal Medicine | Admitting: Internal Medicine

## 2017-11-30 ENCOUNTER — Ambulatory Visit (HOSPITAL_COMMUNITY): Payer: BLUE CROSS/BLUE SHIELD

## 2017-11-30 DIAGNOSIS — N281 Cyst of kidney, acquired: Secondary | ICD-10-CM | POA: Diagnosis not present

## 2017-12-03 ENCOUNTER — Telehealth: Payer: Self-pay | Admitting: Internal Medicine

## 2017-12-03 NOTE — Telephone Encounter (Signed)
Patient requesting results of the US from Friday

## 2017-12-03 NOTE — Telephone Encounter (Signed)
See US result note please

## 2017-12-03 NOTE — Progress Notes (Signed)
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  He has 2 choices 1) Review with PCP and PCP can decide about f/u 2) see urologist about this and determine next steps if any  Please cc his PCP on this

## 2017-12-03 NOTE — Telephone Encounter (Signed)
See notes on [place]

## 2017-12-04 ENCOUNTER — Encounter: Payer: Self-pay | Admitting: Internal Medicine

## 2017-12-18 ENCOUNTER — Other Ambulatory Visit (HOSPITAL_COMMUNITY): Payer: Self-pay | Admitting: Internal Medicine

## 2017-12-18 DIAGNOSIS — I2510 Atherosclerotic heart disease of native coronary artery without angina pectoris: Secondary | ICD-10-CM

## 2017-12-19 ENCOUNTER — Encounter: Payer: Self-pay | Admitting: Cardiology

## 2017-12-19 ENCOUNTER — Ambulatory Visit (INDEPENDENT_AMBULATORY_CARE_PROVIDER_SITE_OTHER): Payer: BLUE CROSS/BLUE SHIELD | Admitting: Cardiology

## 2017-12-19 VITALS — BP 112/72 | HR 80 | Ht 68.0 in | Wt 180.0 lb

## 2017-12-19 DIAGNOSIS — R079 Chest pain, unspecified: Secondary | ICD-10-CM

## 2017-12-19 NOTE — Patient Instructions (Signed)
Medication Instructions:  Your physician recommends that you continue on your current medications as directed. Please refer to the Current Medication list given to you today.  If you need a refill on your cardiac medications, please contact your pharmacy first.  Labwork: None ordered   Testing/Procedures: Your physician has requested that you have an exercise tolerance test. For further information please visit [email or web address]. Please also follow instruction sheet, as given.  Follow-Up: Your physician wants you to follow-up as needed with Dr.Turner  Any Other Special Instructions Will Be Listed Below (If Applicable).   [facility] you [facility] choosing [facility]    Antonio PeroneRena Zayanna Pundt, RN  [phone]  If you need a refill on your cardiac medications before your next appointment, please call your pharmacy.

## 2017-12-19 NOTE — Progress Notes (Signed)
Cardiology Office Note:    Date:  12/21/2017   ID:  Antonio [name], Antonio [name] [birth year], MRN [id number]  PCP:  Benita Stabile, MD  Cardiologist:  No primary care provider on file.    Referring MD: Benita Stabile, MD   Chief Complaint  Patient presents with  . Chest Pain    History of Present Illness:    Antonio [name] is a 62 y.o. male with a hx of factor V Leiden deficiency as well as DVT and is on chronic Coumadin.  He has a history of GERD with eosinophilic esophagitis and is on chronic PPI therapy.  In 2016 when he complained of an episode of chest pain that he thought was related to a panic attack for stress and anxiety from work.  Initial workup in the ER was negative.  2D echocardiogram done in 2014 was normal.  Was to have a stress test but I cannot find any record of this.  I have not seen him since then.  He is here today for followup and is doing well.  He denies any chest pain or pressure, SOB, DOE, PND, orthopnea, LE edema, dizziness, palpitations or syncope. He is compliant with his meds and is tolerating meds with no SE.    Past Medical History:  Diagnosis Date  . Allergy   . Asthma   . Cataract   . Cervical spine degeneration   . Chronic headaches   . Clotting disorder (HCC) 01/2013  . Eosinophilic esophagitis   . GERD (gastroesophageal reflux disease)   . Hyperlipidemia   . Klinefelter syndrome   . Pulmonary embolism (HCC) 02/2013  . Seasonal allergies   . TIA (transient ischemic attack) 05/2013    Past Surgical History:  Procedure Laterality Date  . cataract surgery     bilateral  . CHOLECYSTECTOMY     ERCP for cbd stones  . COLONOSCOPY    . ERCP    . HAND TENDON SURGERY     left, work comp  . TONSILLECTOMY    . UPPER GASTROINTESTINAL ENDOSCOPY      Current Medications: Current Meds  Medication Sig  . loratadine (CLARITIN) 10 MG tablet Take 10 mg by mouth daily.  . montelukast (SINGULAIR) 10 MG tablet TAKE 1 TABLET BY MOUTH ONCE DAILY.  . Multiple  Vitamins-Minerals (CENTRUM SILVER PO) Take 1 tablet by mouth daily.  . NONFORMULARY OR COMPOUNDED ITEM Apply 1 application topically daily. Testosterone Gel 10% Gel - Get's at [facility]  . OVER THE COUNTER MEDICATION OTC Mega Red Krill Oil 300 mg taking everyday  . OVER THE COUNTER MEDICATION Take 1 tablet by mouth daily. Move Free Ultra (Boron, Hyaluronic acid, Typee II collagenase)  . pantoprazole (PROTONIX) 40 MG tablet TAKE 1 TABLET BY MOUTH ONCE DAILY.  Marland Kitchen PROAIR HFA 108 (90 BASE) MCG/ACT inhaler USE 1 TO 2 PUFFS EVERY FOUR TO SIX HOURS AS NEEDED.  Marland Kitchen warfarin (COUMADIN) 5 MG tablet Take 1 tablet (5 mg total) by mouth daily.     Allergies:   Codeine   Social History   Socioeconomic History  . Marital status: Married    Spouse name: [name]  . Number of children: 0  . Years of education: 15  . Highest education level: Not on file  Occupational History  . Occupation: [employer] DRIVER    Employer: [employer]  Social Needs  . Financial resource strain: Not on file  . Food insecurity:    Worry: Not on file  Inability: Not on file  . Transportation needs:    Medical: Not on file    Non-medical: Not on file  Tobacco Use  . Smoking status: Passive Smoke Exposure - Never Smoker  . Smokeless tobacco: Never Used  Substance and Sexual Activity  . Alcohol use: No    Alcohol/week: 0.0 oz  . Drug use: No  . Sexual activity: Yes    Partners: Female  Lifestyle  . Physical activity:    Days per week: Not on file    Minutes per session: Not on file  . Stress: Not on file  Relationships  . Social connections:    Talks on phone: Not on file    Gets together: Not on file    Attends religious service: Not on file    Active member of club or organization: Not on file    Attends meetings of clubs or organizations: Not on file    Relationship status: Not on file  Other Topics Concern  . Not on file  Social History Narrative   Lives with his wife. She has been diagnosed with  COPD and continues to smoke.     Family History: The patient's family history includes Allergies in his mother and sister; Irritable bowel syndrome in his sister; Thyroid disease in his mother. There is no history of Colon cancer.  ROS:   Please see the history of present illness.    ROS  All other systems reviewed and negative.   EKGs/Labs/Other Studies Reviewed:    The following studies were reviewed today: nonew  EKG:  EKG today shows NSR at 80bpm with no ST changes   Recent Labs: No results found for requested labs within last 8760 hours.   Recent Lipid Panel    Component Value Date/Time   CHOL 196 05/07/2015 1503   TRIG 94.0 05/07/2015 1503   HDL 35.30 (L) 05/07/2015 1503   CHOLHDL 6 05/07/2015 1503   VLDL 18.8 05/07/2015 1503   LDLCALC 142 (H) 05/07/2015 1503    Physical Exam:    VS:  BP 112/72   Pulse 80   Ht 5\' 8"  (1.727 m)   Wt 180 lb (81.6 kg)   BMI 27.37 kg/m     Wt Readings from Last 3 Encounters:  12/19/17 180 lb (81.6 kg)  10/15/17 183 lb 6 oz (83.2 kg)  09/27/17 183 lb (83 kg)     GEN:  Well nourished, well developed in no acute distress HEENT: Normal NECK: No JVD; No carotid bruits LYMPHATICS: No lymphadenopathy CARDIAC: RRR, no murmurs, rubs, gallops RESPIRATORY:  Clear to auscultation without rales, wheezing or rhonchi  ABDOMEN: Soft, non-tender, non-distended MUSCULOSKELETAL:  No edema; No deformity  SKIN: Warm and dry NEUROLOGIC:  Alert and oriented x 3 PSYCHIATRIC:  Normal affect   ASSESSMENT:    1. Chest pain, unspecified type    PLAN:    In order of problems listed above:  1.  Chest pain - he has not had any further chest pain since I saw him in 2016.  He is very sedentary and is not get any aerobic exercise.  He has gained a lot of weight and would like to start an exercise program.  Given his cardiac risk factors including male sex, age greater than 40 years and hyperlipidemia I recommended we get a baseline exercise  treadmill test.  I would like you to have this done before he starts his exercise program.  He also has a renal cyst that is growing  and may have to have this removed I told him that he will needed preoperative clearance and I will need a stress test prior to clearing him from a cardiac standpoint.  If his stress test is normal he can follow-up with me on a as needed basis.   Medication Adjustments/Labs and Tests Ordered: Current medicines are reviewed at length with the patient today.  Concerns regarding medicines are outlined above.  Orders Placed This Encounter  Procedures  . EXERCISE TOLERANCE TEST (ETT)  . EKG 12-Lead   No orders of the defined types were placed in this encounter.   Signed, Armanda Magicraci Caitlinn Klinker, MD  12/21/2017 11:07 PM    [facility]

## 2018-07-08 ENCOUNTER — Telehealth: Payer: Self-pay | Admitting: Cardiology
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
## 2018-07-08 NOTE — Telephone Encounter (Signed)
Spoke with the patient and gave him his ICD code. He expressed understanding.

## 2018-10-30 ENCOUNTER — Other Ambulatory Visit: Payer: Self-pay | Admitting: Internal Medicine

## 2018-11-11 ENCOUNTER — Other Ambulatory Visit: Payer: Self-pay | Admitting: Oncology

## 2018-11-28 ENCOUNTER — Other Ambulatory Visit: Payer: Self-pay | Admitting: Internal Medicine

## 2018-11-29 ENCOUNTER — Other Ambulatory Visit: Payer: Self-pay | Admitting: Internal Medicine

## 2018-11-29 NOTE — Telephone Encounter (Signed)
This should be sent to his PCP I think

## 2018-11-29 NOTE — Telephone Encounter (Signed)
Please advise Sir? 

## 2018-12-03 ENCOUNTER — Other Ambulatory Visit: Payer: Self-pay | Admitting: Internal Medicine

## 2018-12-25 ENCOUNTER — Other Ambulatory Visit: Payer: Self-pay | Admitting: Oncology

## 2019-03-10 ENCOUNTER — Telehealth: Payer: Self-pay | Admitting: Radiology

## 2019-03-10 NOTE — Telephone Encounter (Signed)
New Message    Need to rescheduled ETT.

## 2019-03-11 ENCOUNTER — Telehealth: Payer: Self-pay | Admitting: Cardiology

## 2019-03-11 NOTE — Telephone Encounter (Signed)
03-11-19/9:11am/Left Pt detailed vm asking for a return call to confirm message rec'd [id number] 03-14-19 was cancelled due to [place] 19 and will be rescheduled at a later date./reneeval

## 2019-03-19 ENCOUNTER — Other Ambulatory Visit: Payer: Self-pay | Admitting: Oncology

## 2019-04-29 ENCOUNTER — Other Ambulatory Visit: Payer: Self-pay | Admitting: Internal Medicine

## 2019-04-29 ENCOUNTER — Other Ambulatory Visit: Payer: Self-pay | Admitting: Oncology

## 2019-06-28 ENCOUNTER — Ambulatory Visit (INDEPENDENT_AMBULATORY_CARE_PROVIDER_SITE_OTHER): Payer: Self-pay

## 2019-06-28 ENCOUNTER — Encounter (HOSPITAL_COMMUNITY): Payer: Self-pay

## 2019-06-28 ENCOUNTER — Ambulatory Visit (HOSPITAL_COMMUNITY)
Admission: EM | Admit: 2019-06-28 | Discharge: 2019-06-28 | Disposition: A | Discharge status: Home / Self Care | Payer: Self-pay

## 2019-06-28 ENCOUNTER — Other Ambulatory Visit: Payer: Self-pay

## 2019-06-28 DIAGNOSIS — S62515B Nondisplaced fracture of proximal phalanx of left thumb, initial encounter for open fracture: Secondary | ICD-10-CM

## 2019-06-28 DIAGNOSIS — M542 Cervicalgia: Secondary | ICD-10-CM

## 2019-06-28 DIAGNOSIS — M545 Low back pain, unspecified: Secondary | ICD-10-CM

## 2019-06-28 DIAGNOSIS — Z23 Encounter for immunization: Secondary | ICD-10-CM

## 2019-06-28 DIAGNOSIS — S62515A Nondisplaced fracture of proximal phalanx of left thumb, initial encounter for closed fracture: Secondary | ICD-10-CM

## 2019-06-28 MED ORDER — TETANUS-DIPHTH-ACELL PERTUSSIS 5-2.5-18.5 LF-MCG/0.5 IM SUSP
INTRAMUSCULAR | Status: AC
  Filled 2019-06-28: qty 0.5

## 2019-06-28 MED ORDER — TETANUS-DIPHTH-ACELL PERTUSSIS 5-2.5-18.5 LF-MCG/0.5 IM SUSP
0.5000 mL | Freq: Once | INTRAMUSCULAR | Status: AC
  Administered 2019-06-28: 19:00:00 0.5 mL via INTRAMUSCULAR

## 2019-06-28 MED ORDER — CEFDINIR 300 MG PO CAPS: 300.0000 mg | ORAL_CAPSULE | Freq: Two times a day (BID) | ORAL | 0 refills | Status: DC
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
## 2019-06-28 NOTE — Discharge Instructions (Addendum)
Take the antibiotic as prescribed.    Wear the finger splint to protect your broken thumb.    Call the hand specialist /orthopedist listed below on Monday morning to schedule an appointment.    Go to the emergency department if you have uncontrolled bleeding.

## 2019-07-02 ENCOUNTER — Other Ambulatory Visit: Payer: Self-pay | Admitting: Internal Medicine

## 2019-12-01 ENCOUNTER — Other Ambulatory Visit: Payer: Self-pay | Admitting: Internal Medicine

## 2019-12-22 ENCOUNTER — Encounter (HOSPITAL_COMMUNITY): Payer: Self-pay | Admitting: Speech Pathology

## 2019-12-22 ENCOUNTER — Other Ambulatory Visit: Payer: Self-pay

## 2019-12-22 ENCOUNTER — Ambulatory Visit (HOSPITAL_COMMUNITY): Payer: BLUE CROSS/BLUE SHIELD | Attending: Neurology | Admitting: Speech Pathology

## 2019-12-22 DIAGNOSIS — R41841 Cognitive communication deficit: Secondary | ICD-10-CM | POA: Diagnosis not present
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
## 2020-02-09 ENCOUNTER — Other Ambulatory Visit: Payer: Self-pay | Admitting: Internal Medicine

## 2021-11-25 ENCOUNTER — Ambulatory Visit: Payer: PPO | Admitting: Internal Medicine

## 2021-11-25 ENCOUNTER — Encounter: Payer: Self-pay | Admitting: Internal Medicine

## 2021-11-25 VITALS — BP 108/66 | HR 65 | Ht 68.0 in | Wt 176.0 lb

## 2021-11-25 DIAGNOSIS — K200 Eosinophilic esophagitis: Secondary | ICD-10-CM | POA: Diagnosis not present

## 2021-11-25 DIAGNOSIS — K219 Gastro-esophageal reflux disease without esophagitis: Secondary | ICD-10-CM

## 2021-11-25 DIAGNOSIS — N529 Male erectile dysfunction, unspecified: Secondary | ICD-10-CM

## 2021-11-25 NOTE — Patient Instructions (Signed)
We will refer you to [facility]  ? ?Follow up as needed ? ?If you are age 65 or older, your body mass index should be between 23-30. Your Body mass index is 26.76 kg/m?Marland Kitchen If this is out of the aforementioned range listed, please consider follow up with your Primary Care Provider. ? ?If you are age 6 or younger, your body mass index should be between 19-25. Your Body mass index is 26.76 kg/m?Marland Kitchen If this is out of the aformentioned range listed, please consider follow up with your Primary Care Provider.  ? ?________________________________________________________ ? ?The [facility] providers would like to encourage you to use [facility] to communicate with providers for non-urgent requests or questions.  Due to long hold times on the telephone, sending your provider a message by [facility] may be a faster and more efficient way to get a response.  Please allow 48 business hours for a response.  Please remember that this is for non-urgent requests.  ?_______________________________________________________  ? ?I appreciate the  opportunity to care for you ? ?Thank You  ? ?Denice Paradise ?

## 2021-11-25 NOTE — Progress Notes (Signed)
? ?Antonio [name] 66 y.o. [birth year] [id number] ? ?Assessment & Plan:  ? ?Encounter Diagnoses  ?Name Primary?  ? Eosinophilic esophagitis Yes  ? Gastroesophageal reflux disease, unspecified whether esophagitis present   ? Erectile dysfunction, unspecified erectile dysfunction type   ? ? ? ?Continue PPI and Singulair for eosinophilic esophagitis/GERD. ?I will refer him to urology regarding erectile dysfunction complaints. ?He may follow-up with primary care for refills of PPI and Singulair ?Consider repeat CT colonoscopy versus other testing for colon cancer screening 5 years from last which would be around March 2024-there is a recall in place ? ?Subjective:  ? ?Chief Complaint: GERD and EOE, erectile dysfunction ? ?HPI ?The patient is a 66 year old white man known to me over the years with a history of eosinophilic esophagitis and GERD status post esophageal dilation.  That is fine there is no dysphagia there is occasional Tums usage.  He takes pantoprazole 40 mg daily and is also been on montelukast for years for allergies.  He has been having some erectile dysfunction issues and is interested in a referral to urology.  He mistakenly thought that I treated those sort of conditions.  He has tried Cialis without benefit. ? ?CT colonoscopy in March 2019 was negative for polyps ?Allergies  ?Allergen Reactions  ? Codeine Shortness Of Breath, Nausea Only and Other (See Comments)  ?  REACTION: difficulty breathing, nausea  ? ?Current Meds  ?Medication Sig  ? montelukast (SINGULAIR) 10 MG tablet TAKE 1 TABLET BY MOUTH ONCE DAILY.  ? Multiple Vitamins-Minerals (CENTRUM SILVER PO) Take 1 tablet by mouth daily.  ? NONFORMULARY OR COMPOUNDED ITEM Apply 1 application topically daily. Testosterone Gel 10% Gel - Get's at [facility]  ? pantoprazole (PROTONIX) 40 MG tablet TAKE 1 TABLET BY MOUTH ONCE DAILY.  ? PROAIR HFA 108 (90 BASE) MCG/ACT inhaler USE 1 TO 2 PUFFS EVERY FOUR TO SIX HOURS AS NEEDED.  ? tadalafil  (CIALIS) 5 MG tablet Take 5 mg by mouth daily.  ? warfarin (COUMADIN) 5 MG tablet TAKE 1 TABLET BY MOUTH DAILY.  ? ?Past Medical History:  ?Diagnosis Date  ? Allergy   ? Asthma   ? Cataract   ? Cervical spine degeneration   ? Chronic headaches   ? Clotting disorder (HCC) 01/2013  ? Eosinophilic esophagitis   ? GERD (gastroesophageal reflux disease)   ? Hyperlipidemia   ? Klinefelter syndrome   ? Pulmonary embolism (HCC) 02/2013  ? Seasonal allergies   ? TIA (transient ischemic attack) 05/2013  ? ?Past Surgical History:  ?Procedure Laterality Date  ? cataract surgery    ? bilateral  ? CHOLECYSTECTOMY    ? ERCP for cbd stones  ? COLONOSCOPY    ? ERCP    ? HAND TENDON SURGERY    ? left, work comp  ? TONSILLECTOMY    ? UPPER GASTROINTESTINAL ENDOSCOPY    ? ?Social History  ? ?Social History Narrative  ? Lives with his wife. She has been diagnosed with COPD and continues to smoke.  ? Wife died July 2022-widowed  ? 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 No alcohol tobacco or drug use  ? ?family history includes Allergies in his mother and sister; Irritable bowel syndrome in his sister; Thyroid disease in his mother. ? ? ?Review of Systems ?See HPI also notable for allergy problems sore throat and dyspnea at times recently ? ?Objective:  ? Physical Exam ?BP 108/66   Pulse 65   Ht 5'  8" (1.727 m)   Wt 176 lb (79.8 kg)   BMI 26.76 kg/m?  ? ? ?

## 2022-03-06 ENCOUNTER — Ambulatory Visit
Admission: EM | Admit: 2022-03-06 | Discharge: 2022-03-06 | Disposition: A | Discharge status: Home / Self Care | Payer: PPO

## 2022-03-06 DIAGNOSIS — M79601 Pain in right arm: Secondary | ICD-10-CM

## 2022-03-06 MED ORDER — TIZANIDINE HCL 4 MG PO TABS: 4.0000 mg | ORAL_TABLET | Freq: Every evening | ORAL | 0 refills | Status: DC | PRN

## 2022-03-06 NOTE — Discharge Instructions (Addendum)
-   It sounds like you may have strained muscles in your right arm -Start Tylenol 500 mg every 6 hours during the day; you can also use heat or ice as needed to help with pain.  Please stay away from all NSAIDs. -At nighttime, you can use tizanidine 4 mg as needed for muscle pain -You can try some of the exercises that are provided -If your symptoms persist without improvement for the next 1 to 2 weeks, please follow-up with the urgent care orthopedic clinic

## 2022-03-06 NOTE — ED Triage Notes (Signed)
Pt reports he tore a muscle in the right arm 2 days ago when moving a washer/dryer machine.Pt ha snot taken any meds for complaint.

## 2022-03-06 NOTE — ED Provider Notes (Signed)
[facility]    CSN: [id number] Arrival date & time: 03/06/22  1358      History   Chief Complaint Chief Complaint  Patient presents with   Arm Pain    HPI DONEVAN BILLER is a 66 y.o. male.   Patient presents with a few days of right arm pain.  Reports he moved a washed and dryer with a hand truck on Saturday and noticed the pain a couple of hours after.  Reports the pain is a 8/10 and is around his elbow and shoulder.  Denies radiation of pain to the fingertips, weakness, numbness or tingling, decreased grip, redness, swelling, bruising, or fevers.  He has not tried anything so far for the pain.  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  Reports compliance with medication.  Also history of GERD, eosinophilic esophagitis, Klinefelter syndrome.      Past Medical History:  Diagnosis Date   Allergy    Asthma    Cataract    Cervical spine degeneration    Chronic headaches    Clotting disorder (HCC) 01/2013   Eosinophilic esophagitis    GERD (gastroesophageal reflux disease)    Hyperlipidemia    Klinefelter syndrome    Pulmonary embolism (HCC) 02/2013   Seasonal allergies    TIA (transient ischemic attack) 05/2013    Patient Active Problem List   Diagnosis Date Noted   Hyperlipidemia 04/03/2015   Chest pain 04/02/2015   Primary hypercoagulable state (HCC) 02/20/2013   DVT (deep venous thrombosis) (HCC) 02/14/2013   Klinefelter's syndrome 10/04/2012   Cervical spine degeneration    Lightheadedness 07/09/2012   FATIGUE 12/14/2008   Intermittent asthma 08/12/2008   GERD 08/12/2008   EOSINOPHILIC ESOPHAGITIS 06/03/2008   ALLERGY 06/03/2008    Past Surgical History:  Procedure Laterality Date   cataract surgery     bilateral   CHOLECYSTECTOMY     ERCP for cbd stones   COLONOSCOPY     ERCP     HAND TENDON SURGERY     left, work comp   TONSILLECTOMY     UPPER GASTROINTESTINAL ENDOSCOPY          Home Medications    Prior to Admission medications   Medication Sig Start Date End Date Taking? Authorizing Provider  ALBUTEROL IN Inhale into the lungs.   Yes [provider]  tiZANidine (ZANAFLEX) 4 MG tablet Take 1 tablet (4 mg total) by mouth at bedtime as needed for muscle spasms. Do not take while driving or operating heavy machinery 03/06/22  Yes Cathlean Marseilles A, NP  montelukast (SINGULAIR) 10 MG tablet TAKE 1 TABLET BY MOUTH ONCE DAILY. 11/27/16   Iva Boop, MD  Multiple Vitamins-Minerals (CENTRUM SILVER PO) Take 1 tablet by mouth daily. 02/24/13   [provider]  NONFORMULARY OR COMPOUNDED ITEM Apply 1 application topically daily. Testosterone Gel 10% Gel - Get's at [facility]    [provider]  pantoprazole (PROTONIX) 40 MG tablet TAKE 1 TABLET BY MOUTH ONCE DAILY. 12/01/19   Iva Boop, MD  PROAIR HFA 108 (90 BASE) MCG/ACT inhaler USE 1 TO 2 PUFFS EVERY FOUR TO SIX HOURS AS NEEDED. 03/11/15   Le, Thao P, DO  tadalafil (CIALIS) 5 MG tablet Take 5 mg by mouth daily. 11/07/21   [provider]  warfarin (COUMADIN) 5 MG tablet TAKE 1 TABLET BY MOUTH DAILY. 04/30/19   Magrinat, Valentino Hue, MD    Family History  Family History  Problem Relation Age of Onset   Irritable bowel syndrome Sister    Allergies Sister    Allergies Mother    Thyroid disease Mother    Colon cancer Neg Hx     Social History Social History   Tobacco Use   Smoking status: Passive Smoke Exposure - Never Smoker   Smokeless tobacco: Never  Vaping Use   Vaping Use: Never used  Substance Use Topics   Alcohol use: No    Alcohol/week: 0.0 standard drinks of alcohol   Drug use: No     Allergies   Codeine   Review of Systems Review of Systems Per HPI  Physical Exam Triage Vital Signs ED Triage Vitals  Enc Vitals Group     BP 03/06/22 1410 102/67     Pulse Rate 03/06/22 1410 75     Resp 03/06/22 1410 16     Temp 03/06/22 1410 97.8 F  (36.6 C)     Temp Source 03/06/22 1410 Oral     SpO2 03/06/22 1410 96 %     Weight --      Height --      Head Circumference --      Peak Flow --      Pain Score 03/06/22 1411 8     Pain Loc --      Pain Edu? --      Excl. in GC? --    No data found.  Updated Vital Signs BP 102/67 (BP Location: Right Arm)   Pulse 75   Temp 97.8 F (36.6 C) (Oral)   Resp 16   SpO2 96%   Visual Acuity Right Eye Distance:   Left Eye Distance:   Bilateral Distance:    Right Eye Near:   Left Eye Near:    Bilateral Near:     Physical Exam Vitals and nursing note reviewed.  Constitutional:      General: He is not in acute distress.    Appearance: Normal appearance. He is not ill-appearing, toxic-appearing or diaphoretic.  Musculoskeletal:     Right shoulder: Tenderness present. No swelling, deformity or effusion. Normal range of motion. Normal strength. Normal pulse.     Left shoulder: Normal.     Right upper arm: Tenderness present. No swelling, edema, deformity or lacerations.     Left upper arm: Normal.     Right elbow: No swelling or deformity. Normal range of motion. Tenderness present.     Left elbow: Normal.     Right forearm: Tenderness present. No swelling or bony tenderness.     Left forearm: Normal.     Right wrist: Normal. Normal pulse.     Left wrist: Normal. Normal pulse.     Right hand: Normal capillary refill. Normal pulse.     Left hand: Normal. Normal capillary refill. Normal pulse.       Arms:     Comments: Inspection: No swelling, obvious deformity, or redness to areas of concern Palpation: No obvious deformities palpated; tender to palpation in the areas marked ROM: Full ROM to ankle and flexibility of upper extremities bilaterally Strength: 5/5 bilateral hands Neurovascular: neurovascularly intact in left and right upper extremities   Skin:    General: Skin is warm and dry.     Capillary Refill: Capillary refill takes less than 2 seconds.     Coloration:  Skin is not jaundiced or pale.     Findings: No erythema.  Neurological:     Mental Status: He  is alert and oriented to person, place, and time.  Psychiatric:        Behavior: Behavior is cooperative.      UC Treatments / Results  Labs (all labs ordered are listed, but only abnormal results are displayed) Labs Reviewed - No data to display  EKG   Radiology No results found.  Procedures Procedures (including critical care time)  Medications Ordered in UC Medications - No data to display  Initial Impression / Assessment and Plan / UC Course  I have reviewed the triage vital signs and the nursing notes.  Pertinent labs & imaging results that were available during my care of the patient were reviewed by me and considered in my medical decision making (see chart for details).     Very pleasant, well-appearing 66 year old male with right arm pain that started a couple of days ago.  I suspect muscular injury; he does not have any symptoms of a tendon rupture today.  Treat muscular pain with Tylenol 500 mg every 6 hours as needed during the day, tizanidine 4 mg at nighttime as needed.  Can also use heat or ice as needed to help with pain.  Discussed stretching/exercises.  If symptoms persist without improvement over the next 1 to 2 weeks, follow-up with orthopedic urgent care or orthopedic provider.  The patient was given the opportunity to ask questions.  All questions answered to their satisfaction.  The patient is in agreement to this plan.   Final Clinical Impressions(s) / UC Diagnoses   Final diagnoses:  Right arm pain     Discharge Instructions      - It sounds like you may have strained muscles in your right arm -Start Tylenol 500 mg every 6 hours during the day; you can also use heat or ice as needed to help with pain.  Please stay away from all NSAIDs. -At nighttime, you can use tizanidine 4 mg as needed for muscle pain -You can try some of the exercises that are  provided -If your symptoms persist without improvement for the next 1 to 2 weeks, please follow-up with the urgent care orthopedic clinic    ED Prescriptions     Medication Sig Dispense Auth. Provider   tiZANidine (ZANAFLEX) 4 MG tablet Take 1 tablet (4 mg total) by mouth at bedtime as needed for muscle spasms. Do not take while driving or operating heavy machinery 30 tablet Valentino Nose, NP      PDMP not reviewed this encounter.   Valentino Nose, NP 03/06/22 1445

## 2022-09-20 DIAGNOSIS — D6851 Activated protein C resistance: Secondary | ICD-10-CM | POA: Diagnosis not present

## 2022-09-20 DIAGNOSIS — Z7901 Long term (current) use of anticoagulants: Secondary | ICD-10-CM | POA: Diagnosis not present

## 2022-09-27 DIAGNOSIS — M65312 Trigger thumb, left thumb: Secondary | ICD-10-CM | POA: Diagnosis not present

## 2022-09-27 DIAGNOSIS — M65311 Trigger thumb, right thumb: Secondary | ICD-10-CM | POA: Diagnosis not present

## 2022-10-06 DIAGNOSIS — R948 Abnormal results of function studies of other organs and systems: Secondary | ICD-10-CM | POA: Diagnosis not present

## 2022-10-06 DIAGNOSIS — E291 Testicular hypofunction: Secondary | ICD-10-CM | POA: Diagnosis not present

## 2022-10-09 DIAGNOSIS — M5383 Other specified dorsopathies, cervicothoracic region: Secondary | ICD-10-CM | POA: Diagnosis not present

## 2022-10-09 DIAGNOSIS — M9901 Segmental and somatic dysfunction of cervical region: Secondary | ICD-10-CM | POA: Diagnosis not present

## 2022-10-09 DIAGNOSIS — M9902 Segmental and somatic dysfunction of thoracic region: Secondary | ICD-10-CM | POA: Diagnosis not present

## 2022-10-09 DIAGNOSIS — M9907 Segmental and somatic dysfunction of upper extremity: Secondary | ICD-10-CM | POA: Diagnosis not present

## 2022-10-11 DIAGNOSIS — M9902 Segmental and somatic dysfunction of thoracic region: Secondary | ICD-10-CM | POA: Diagnosis not present

## 2022-10-11 DIAGNOSIS — M9907 Segmental and somatic dysfunction of upper extremity: Secondary | ICD-10-CM | POA: Diagnosis not present

## 2022-10-11 DIAGNOSIS — M9901 Segmental and somatic dysfunction of cervical region: Secondary | ICD-10-CM | POA: Diagnosis not present

## 2022-10-11 DIAGNOSIS — M5383 Other specified dorsopathies, cervicothoracic region: Secondary | ICD-10-CM | POA: Diagnosis not present

## 2022-10-13 DIAGNOSIS — N529 Male erectile dysfunction, unspecified: Secondary | ICD-10-CM | POA: Diagnosis not present

## 2022-10-13 DIAGNOSIS — E291 Testicular hypofunction: Secondary | ICD-10-CM | POA: Diagnosis not present

## 2022-10-16 DIAGNOSIS — M5383 Other specified dorsopathies, cervicothoracic region: Secondary | ICD-10-CM | POA: Diagnosis not present

## 2022-10-16 DIAGNOSIS — M9902 Segmental and somatic dysfunction of thoracic region: Secondary | ICD-10-CM | POA: Diagnosis not present

## 2022-10-16 DIAGNOSIS — M9901 Segmental and somatic dysfunction of cervical region: Secondary | ICD-10-CM | POA: Diagnosis not present

## 2022-10-16 DIAGNOSIS — M9907 Segmental and somatic dysfunction of upper extremity: Secondary | ICD-10-CM | POA: Diagnosis not present

## 2022-10-27 DIAGNOSIS — D6851 Activated protein C resistance: Secondary | ICD-10-CM | POA: Diagnosis not present

## 2022-10-27 DIAGNOSIS — Z7901 Long term (current) use of anticoagulants: Secondary | ICD-10-CM | POA: Diagnosis not present

## 2022-10-30 DIAGNOSIS — M9901 Segmental and somatic dysfunction of cervical region: Secondary | ICD-10-CM | POA: Diagnosis not present

## 2022-10-30 DIAGNOSIS — M19032 Primary osteoarthritis, left wrist: Secondary | ICD-10-CM | POA: Diagnosis not present

## 2022-10-30 DIAGNOSIS — M9902 Segmental and somatic dysfunction of thoracic region: Secondary | ICD-10-CM | POA: Diagnosis not present

## 2022-10-30 DIAGNOSIS — M5383 Other specified dorsopathies, cervicothoracic region: Secondary | ICD-10-CM | POA: Diagnosis not present

## 2022-10-30 DIAGNOSIS — M19031 Primary osteoarthritis, right wrist: Secondary | ICD-10-CM | POA: Diagnosis not present

## 2022-10-30 DIAGNOSIS — M9907 Segmental and somatic dysfunction of upper extremity: Secondary | ICD-10-CM | POA: Diagnosis not present

## 2022-11-03 DIAGNOSIS — M9901 Segmental and somatic dysfunction of cervical region: Secondary | ICD-10-CM | POA: Diagnosis not present

## 2022-11-03 DIAGNOSIS — M5383 Other specified dorsopathies, cervicothoracic region: Secondary | ICD-10-CM | POA: Diagnosis not present

## 2022-11-03 DIAGNOSIS — M9907 Segmental and somatic dysfunction of upper extremity: Secondary | ICD-10-CM | POA: Diagnosis not present

## 2022-11-03 DIAGNOSIS — M9902 Segmental and somatic dysfunction of thoracic region: Secondary | ICD-10-CM | POA: Diagnosis not present

## 2022-11-03 DIAGNOSIS — M19032 Primary osteoarthritis, left wrist: Secondary | ICD-10-CM | POA: Diagnosis not present

## 2022-11-03 DIAGNOSIS — M19031 Primary osteoarthritis, right wrist: Secondary | ICD-10-CM | POA: Diagnosis not present

## 2022-11-10 DIAGNOSIS — M79645 Pain in left finger(s): Secondary | ICD-10-CM | POA: Diagnosis not present

## 2022-11-17 DIAGNOSIS — D6851 Activated protein C resistance: Secondary | ICD-10-CM | POA: Diagnosis not present

## 2022-11-17 DIAGNOSIS — Z7901 Long term (current) use of anticoagulants: Secondary | ICD-10-CM | POA: Diagnosis not present

## 2022-11-20 DIAGNOSIS — Z7901 Long term (current) use of anticoagulants: Secondary | ICD-10-CM | POA: Diagnosis not present

## 2022-12-08 DIAGNOSIS — M79645 Pain in left finger(s): Secondary | ICD-10-CM | POA: Diagnosis not present

## 2022-12-13 DIAGNOSIS — D1801 Hemangioma of skin and subcutaneous tissue: Secondary | ICD-10-CM | POA: Diagnosis not present

## 2022-12-13 DIAGNOSIS — L814 Other melanin hyperpigmentation: Secondary | ICD-10-CM | POA: Diagnosis not present

## 2022-12-13 DIAGNOSIS — L821 Other seborrheic keratosis: Secondary | ICD-10-CM | POA: Diagnosis not present

## 2022-12-29 DIAGNOSIS — D6851 Activated protein C resistance: Secondary | ICD-10-CM | POA: Diagnosis not present

## 2022-12-29 DIAGNOSIS — Z7901 Long term (current) use of anticoagulants: Secondary | ICD-10-CM | POA: Diagnosis not present

## 2023-01-01 DIAGNOSIS — Z7901 Long term (current) use of anticoagulants: Secondary | ICD-10-CM | POA: Diagnosis not present

## 2023-01-02 ENCOUNTER — Encounter: Payer: Self-pay | Admitting: Internal Medicine

## 2023-01-06 ENCOUNTER — Other Ambulatory Visit: Payer: Self-pay

## 2023-01-06 ENCOUNTER — Encounter (HOSPITAL_COMMUNITY): Payer: Self-pay | Admitting: Emergency Medicine

## 2023-01-06 ENCOUNTER — Emergency Department (HOSPITAL_COMMUNITY)
Admission: EM | Admit: 2023-01-06 | Discharge: 2023-01-06 | Disposition: A | Discharge status: Home / Self Care | Payer: PPO | Attending: Emergency Medicine | Admitting: Emergency Medicine

## 2023-01-06 ENCOUNTER — Emergency Department (HOSPITAL_COMMUNITY): Payer: PPO

## 2023-01-06 DIAGNOSIS — R079 Chest pain, unspecified: Secondary | ICD-10-CM | POA: Diagnosis not present

## 2023-01-06 DIAGNOSIS — Z7901 Long term (current) use of anticoagulants: Secondary | ICD-10-CM | POA: Insufficient documentation

## 2023-01-06 DIAGNOSIS — R0789 Other chest pain: Secondary | ICD-10-CM | POA: Diagnosis not present

## 2023-01-06 HISTORY — DX: Panic disorder (episodic paroxysmal anxiety): F41.0

## 2023-01-06 LAB — BASIC METABOLIC PANEL
Anion gap: 8 (ref 5–15)
BUN: 16 mg/dL (ref 8–23)
CO2: 27 mmol/L (ref 22–32)
Calcium: 8.7 mg/dL — ABNORMAL LOW (ref 8.9–10.3)
Chloride: 104 mmol/L (ref 98–111)
Creatinine, Ser: 1.02 mg/dL (ref 0.61–1.24)
GFR, Estimated: >60 mL/min (ref >60)
Glucose, Bld: 98 mg/dL (ref 70–99)
Potassium: 3.7 mmol/L (ref 3.5–5.1)
Sodium: 139 mmol/L (ref 135–145)

## 2023-01-06 LAB — CBC
HCT: 42.9 % (ref 39.0–52.0)
Hemoglobin: 14.8 g/dL (ref 13.0–17.0)
MCH: 30.5 pg (ref 26.0–34.0)
MCHC: 34.5 g/dL (ref 30.0–36.0)
MCV: 88.3 fL (ref 80.0–100.0)
Platelets: 175 10*3/uL (ref 150–400)
RBC: 4.86 MIL/uL (ref 4.22–5.81)
RDW: 13 % (ref 11.5–15.5)
WBC: 4.9 10*3/uL (ref 4.0–10.5)
nRBC: 0 % (ref 0.0–0.2)

## 2023-01-06 LAB — PROTIME-INR
INR: 2.3 — ABNORMAL HIGH (ref 0.8–1.2)
Prothrombin Time: 25.3 seconds — ABNORMAL HIGH (ref 11.4–15.2)

## 2023-01-06 LAB — TROPONIN I (HIGH SENSITIVITY): Troponin I (High Sensitivity): 3 ng/L (ref <18)

## 2023-01-06 LAB — D-DIMER, QUANTITATIVE: D-Dimer, Quant: 0.27 ug/mL-FEU (ref 0.00–0.50)

## 2023-01-06 NOTE — ED Notes (Signed)
Patient transported to X-ray 

## 2023-01-06 NOTE — ED Triage Notes (Addendum)
Pt states he is having tightness in his chest. Pt states he has a hx of blood clots and panic attacks and that his heart has been racing for the past 36 hrs. Pt states he took a Tylenol approximately an hour ago.

## 2023-01-06 NOTE — ED Provider Notes (Signed)
[place] EM[facility] DE[facility]MENT AT [facility]  Provider Note  CSN: [id number] Arrival date & time: 01/06/23 4098  History Chief Complaint  Patient presents with   Tightness in Chest    Antonio [name] is a 67 y.o. 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 He is concerned about PE although also has panic attacks and may have some degree of anxiety with it as well. No known CAD.    Home Medications Prior to Admission medications   Medication Sig Start Date End Date Taking? Authorizing Provider  ALBUTEROL IN Inhale into the lungs.    [provider]  montelukast (SINGULAIR) 10 MG tablet TAKE 1 TABLET BY MOUTH ONCE DAILY. 11/27/16   Iva Boop, MD  Multiple Vitamins-Minerals (CENTRUM SILVER PO) Take 1 tablet by mouth daily. 02/24/13   [provider]  NONFORMULARY OR COMPOUNDED ITEM Apply 1 application topically daily. Testosterone Gel 10% Gel - Get's at [facility]    [provider]  pantoprazole (PROTONIX) 40 MG tablet TAKE 1 TABLET BY MOUTH ONCE DAILY. 12/01/19   Iva Boop, MD  PROAIR HFA 108 (90 BASE) MCG/ACT inhaler USE 1 TO 2 PUFFS EVERY FOUR TO SIX HOURS AS NEEDED. 03/11/15   Le, Thao P, DO  tadalafil (CIALIS) 5 MG tablet Take 5 mg by mouth daily. 11/07/21   [provider]  tiZANidine (ZANAFLEX) 4 MG tablet Take 1 tablet (4 mg total) by mouth at bedtime as needed for muscle spasms. Do not take while driving or operating heavy machinery 03/06/22   Valentino Nose, NP  warfarin (COUMADIN) 5 MG tablet TAKE 1 TABLET BY MOUTH DAILY. 04/30/19   Magrinat, Valentino Hue, MD     Allergies    Codeine   Review of Systems   Review of Systems Please see HPI for pertinent positives and negatives  Physical Exam BP 135/66 (BP Location: Left Arm)   Pulse 72   Temp 97.7 F (36.5 C) (Oral)   Resp (!) 24   Ht   (1.727 m)   Wt 78 kg   SpO2 100%   BMI 26.15 kg/m   Physical Exam Vitals and nursing note reviewed.  Constitutional:      Appearance: Normal appearance.  HENT:     Head: Normocephalic and atraumatic.     Nose: Nose normal.     Mouth/Throat:     Mouth: Mucous membranes are moist.  Eyes:     Extraocular Movements: Extraocular movements intact.     Conjunctiva/sclera: Conjunctivae normal.  Cardiovascular:     Rate and Rhythm: Normal rate.  Pulmonary:     Effort: Pulmonary effort is normal.     Breath sounds: Normal breath sounds.  Abdominal:     General: Abdomen is flat.     Palpations: Abdomen is soft.     Tenderness: There is no abdominal tenderness.  Musculoskeletal:        General: No swelling. Normal range of motion.     Cervical back: Neck supple.  Skin:    General: Skin is warm and dry.  Neurological:     General: No focal deficit present.     Mental Status: He is alert.  Psychiatric:        Mood and Affect: Mood normal.     ED Results / Procedures / Treatments   EKG EKG Interpretation  Date/Time:  Saturday January 06 2023 03:47:06 EDT Ventricular Rate:  76 PR Interval:  138 QRS Duration: 76 QT Interval:  370 QTC Calculation: 416 R Axis:   53 Text Interpretation: Sinus rhythm Normal ECG No significant change since last tracing Confirmed by Susy Frizzle [phone]) on 01/06/2023 3:51:36 AM  Procedures Procedures  Medications Ordered in the ED Medications - No data to display  Initial Impression and Plan  Patient here with atypical chest pain, ongoing for >24 hours. He is mostly concerned about PE. Vitals and exam are reassuring. EKG is normal. Will check labs including dimer.   ED Course   Clinical Course as of 01/06/23 0524  Sat Jan 06, 2023  0509 INR is therapeutic [CS]  0509 CBC is normal. Dimer is negative.  [CS]  0513 BMP and Trop are normal. Given duration of symptoms, atypical nature and low risk factor profile, repeat Trop is not needed to  rule out AMI. Plan discharge with PCP follow up, RTED for any other concerns.   [CS]    Clinical Course User Index [CS] Pollyann Savoy, MD     MDM Rules/Calculators/A&P Medical Decision Making Given presenting complaint, I considered that admission might be necessary. After review of results from ED lab and/or imaging studies, admission to the hospital is not indicated at this time.    Problems Addressed: Atypical chest pain: acute illness or injury  Amount and/or Complexity of Data Reviewed Labs: ordered. Decision-making details documented in ED Course. Radiology: ordered and independent interpretation performed. Decision-making details documented in ED Course. ECG/medicine tests: ordered and independent interpretation performed. Decision-making details documented in ED Course.  Risk Decision regarding hospitalization.     Final Clinical Impression(s) / ED Diagnoses Final diagnoses:  Atypical chest pain    Rx / DC Orders ED Discharge Orders     None        Pollyann Savoy, MD 01/06/23 [phone]

## 2023-01-08 DIAGNOSIS — M65312 Trigger thumb, left thumb: Secondary | ICD-10-CM | POA: Diagnosis not present

## 2023-01-10 ENCOUNTER — Telehealth: Payer: Self-pay | Admitting: *Deleted

## 2023-01-10 NOTE — Telephone Encounter (Signed)
        Patient  visited [facility] ED on 01/06/2023  for TREATMENT    Telephone encounter attempt :  1ST  A HIPAA compliant voice message was left requesting a return call.  Instructed patient to call back at [phone]. Yehuda Mao Greenauer -[facility] [place], Population [facility] [phone] [address] [place] , [place] [address] Email : [email or web address]

## 2023-01-11 ENCOUNTER — Telehealth: Payer: Self-pay | Admitting: *Deleted

## 2023-01-11 NOTE — Telephone Encounter (Signed)
          Patient  visited [facility] ED on 01/06/2023  for TREATMENT      Telephone encounter attempt : 2nd   A HIPAA compliant voice message was left requesting a return call.  Instructed patient to call back at [phone]. [name], Population [facility] [phone] [address] [place] , [place] [address] Email : [email or web address]

## 2023-02-13 DIAGNOSIS — Q984 Klinefelter syndrome, unspecified: Secondary | ICD-10-CM | POA: Diagnosis not present

## 2023-02-13 DIAGNOSIS — D6851 Activated protein C resistance: Secondary | ICD-10-CM | POA: Diagnosis not present

## 2023-02-13 DIAGNOSIS — J4520 Mild intermittent asthma, uncomplicated: Secondary | ICD-10-CM | POA: Diagnosis not present

## 2023-02-13 DIAGNOSIS — Z7901 Long term (current) use of anticoagulants: Secondary | ICD-10-CM | POA: Diagnosis not present

## 2023-02-13 DIAGNOSIS — Z125 Encounter for screening for malignant neoplasm of prostate: Secondary | ICD-10-CM | POA: Diagnosis not present

## 2023-02-13 DIAGNOSIS — E291 Testicular hypofunction: Secondary | ICD-10-CM | POA: Diagnosis not present

## 2023-02-16 DIAGNOSIS — R0789 Other chest pain: Secondary | ICD-10-CM | POA: Diagnosis not present

## 2023-02-16 DIAGNOSIS — Q984 Klinefelter syndrome, unspecified: Secondary | ICD-10-CM | POA: Diagnosis not present

## 2023-02-16 DIAGNOSIS — I82523 Chronic embolism and thrombosis of iliac vein, bilateral: Secondary | ICD-10-CM | POA: Diagnosis not present

## 2023-02-16 DIAGNOSIS — Z0000 Encounter for general adult medical examination without abnormal findings: Secondary | ICD-10-CM | POA: Diagnosis not present

## 2023-02-16 DIAGNOSIS — J4520 Mild intermittent asthma, uncomplicated: Secondary | ICD-10-CM | POA: Diagnosis not present

## 2023-02-16 DIAGNOSIS — Z7901 Long term (current) use of anticoagulants: Secondary | ICD-10-CM | POA: Diagnosis not present

## 2023-02-16 DIAGNOSIS — E782 Mixed hyperlipidemia: Secondary | ICD-10-CM | POA: Diagnosis not present

## 2023-02-16 DIAGNOSIS — D6851 Activated protein C resistance: Secondary | ICD-10-CM | POA: Diagnosis not present

## 2023-02-20 ENCOUNTER — Other Ambulatory Visit: Payer: Self-pay

## 2023-02-20 DIAGNOSIS — R0789 Other chest pain: Secondary | ICD-10-CM

## 2023-02-20 DIAGNOSIS — E782 Mixed hyperlipidemia: Secondary | ICD-10-CM

## 2023-02-23 ENCOUNTER — Ambulatory Visit
Admission: RE | Admit: 2023-02-23 | Discharge: 2023-02-23 | Disposition: A | Discharge status: Home / Self Care | Payer: PPO | Source: Ambulatory Visit | Attending: Infectious Diseases | Admitting: Infectious Diseases

## 2023-02-23 DIAGNOSIS — R0789 Other chest pain: Secondary | ICD-10-CM | POA: Insufficient documentation

## 2023-02-23 DIAGNOSIS — E782 Mixed hyperlipidemia: Secondary | ICD-10-CM | POA: Insufficient documentation

## 2023-02-28 DIAGNOSIS — Q984 Klinefelter syndrome, unspecified: Secondary | ICD-10-CM | POA: Diagnosis not present

## 2023-02-28 DIAGNOSIS — E291 Testicular hypofunction: Secondary | ICD-10-CM | POA: Diagnosis not present

## 2023-02-28 DIAGNOSIS — I82523 Chronic embolism and thrombosis of iliac vein, bilateral: Secondary | ICD-10-CM | POA: Diagnosis not present

## 2023-02-28 DIAGNOSIS — Z7901 Long term (current) use of anticoagulants: Secondary | ICD-10-CM | POA: Diagnosis not present

## 2023-03-19 DIAGNOSIS — N529 Male erectile dysfunction, unspecified: Secondary | ICD-10-CM | POA: Diagnosis not present

## 2023-03-19 DIAGNOSIS — E291 Testicular hypofunction: Secondary | ICD-10-CM | POA: Diagnosis not present

## 2023-03-30 DIAGNOSIS — E291 Testicular hypofunction: Secondary | ICD-10-CM | POA: Diagnosis not present

## 2023-03-30 DIAGNOSIS — Z7901 Long term (current) use of anticoagulants: Secondary | ICD-10-CM | POA: Diagnosis not present

## 2023-04-27 DIAGNOSIS — Z7901 Long term (current) use of anticoagulants: Secondary | ICD-10-CM | POA: Diagnosis not present

## 2023-04-27 DIAGNOSIS — D6851 Activated protein C resistance: Secondary | ICD-10-CM | POA: Diagnosis not present

## 2023-05-25 DIAGNOSIS — D6851 Activated protein C resistance: Secondary | ICD-10-CM | POA: Diagnosis not present

## 2023-05-25 DIAGNOSIS — Z7901 Long term (current) use of anticoagulants: Secondary | ICD-10-CM | POA: Diagnosis not present

## 2023-05-29 DIAGNOSIS — M542 Cervicalgia: Secondary | ICD-10-CM | POA: Diagnosis not present

## 2023-05-29 DIAGNOSIS — M47812 Spondylosis without myelopathy or radiculopathy, cervical region: Secondary | ICD-10-CM | POA: Diagnosis not present

## 2023-05-29 DIAGNOSIS — M50322 Other cervical disc degeneration at C5-C6 level: Secondary | ICD-10-CM | POA: Diagnosis not present

## 2023-05-29 DIAGNOSIS — M25511 Pain in right shoulder: Secondary | ICD-10-CM | POA: Diagnosis not present

## 2023-06-15 DIAGNOSIS — H6123 Impacted cerumen, bilateral: Secondary | ICD-10-CM | POA: Diagnosis not present

## 2023-07-12 DIAGNOSIS — D6851 Activated protein C resistance: Secondary | ICD-10-CM | POA: Diagnosis not present

## 2023-07-12 DIAGNOSIS — Z7901 Long term (current) use of anticoagulants: Secondary | ICD-10-CM | POA: Diagnosis not present

## 2023-07-16 DIAGNOSIS — Z7901 Long term (current) use of anticoagulants: Secondary | ICD-10-CM | POA: Diagnosis not present

## 2023-08-14 DIAGNOSIS — D6851 Activated protein C resistance: Secondary | ICD-10-CM | POA: Diagnosis not present

## 2023-08-14 DIAGNOSIS — Z7901 Long term (current) use of anticoagulants: Secondary | ICD-10-CM | POA: Diagnosis not present

## 2023-08-23 DIAGNOSIS — D6851 Activated protein C resistance: Secondary | ICD-10-CM | POA: Diagnosis not present

## 2023-08-23 DIAGNOSIS — Q984 Klinefelter syndrome, unspecified: Secondary | ICD-10-CM | POA: Diagnosis not present

## 2023-08-23 DIAGNOSIS — Z23 Encounter for immunization: Secondary | ICD-10-CM | POA: Diagnosis not present

## 2023-08-23 DIAGNOSIS — I82521 Chronic embolism and thrombosis of right iliac vein: Secondary | ICD-10-CM | POA: Diagnosis not present

## 2023-09-05 DIAGNOSIS — D6851 Activated protein C resistance: Secondary | ICD-10-CM | POA: Diagnosis not present

## 2023-09-05 DIAGNOSIS — Z7901 Long term (current) use of anticoagulants: Secondary | ICD-10-CM | POA: Diagnosis not present

## 2023-09-06 DIAGNOSIS — Z7901 Long term (current) use of anticoagulants: Secondary | ICD-10-CM | POA: Diagnosis not present

## 2023-10-09 DIAGNOSIS — D6851 Activated protein C resistance: Secondary | ICD-10-CM | POA: Diagnosis not present

## 2023-10-09 DIAGNOSIS — Z7901 Long term (current) use of anticoagulants: Secondary | ICD-10-CM | POA: Diagnosis not present

## 2023-10-12 DIAGNOSIS — Z7901 Long term (current) use of anticoagulants: Secondary | ICD-10-CM | POA: Diagnosis not present

## 2023-11-12 DIAGNOSIS — D6851 Activated protein C resistance: Secondary | ICD-10-CM | POA: Diagnosis not present

## 2023-11-12 DIAGNOSIS — Z7901 Long term (current) use of anticoagulants: Secondary | ICD-10-CM | POA: Diagnosis not present

## 2023-11-14 DIAGNOSIS — Z7901 Long term (current) use of anticoagulants: Secondary | ICD-10-CM | POA: Diagnosis not present

## 2023-11-16 DIAGNOSIS — D6851 Activated protein C resistance: Secondary | ICD-10-CM | POA: Diagnosis not present

## 2023-11-16 DIAGNOSIS — U071 COVID-19: Secondary | ICD-10-CM | POA: Diagnosis not present

## 2023-11-16 DIAGNOSIS — Z7901 Long term (current) use of anticoagulants: Secondary | ICD-10-CM | POA: Diagnosis not present

## 2023-12-13 ENCOUNTER — Encounter: Payer: Self-pay | Admitting: Podiatry

## 2023-12-13 ENCOUNTER — Ambulatory Visit: Admitting: Podiatry

## 2023-12-13 ENCOUNTER — Ambulatory Visit (INDEPENDENT_AMBULATORY_CARE_PROVIDER_SITE_OTHER)

## 2023-12-13 DIAGNOSIS — M722 Plantar fascial fibromatosis: Secondary | ICD-10-CM | POA: Diagnosis not present

## 2023-12-13 DIAGNOSIS — M79671 Pain in right foot: Secondary | ICD-10-CM | POA: Diagnosis not present

## 2023-12-13 MED ORDER — TRIAMCINOLONE ACETONIDE 10 MG/ML IJ SUSP
10.0000 mg | Freq: Once | INTRAMUSCULAR | Status: AC
  Administered 2023-12-13: 10 mg via INTRA_ARTICULAR

## 2023-12-13 NOTE — Patient Instructions (Signed)

## 2023-12-13 NOTE — Progress Notes (Signed)
 Subjective:   Patient ID: Antonio [name], male   DOB: 68 y.o.   MRN: [id number]   HPI Patient presents stating his right heel has been hurting a lot and has been going on for around 2 months and making activity difficult.  States he has tried some ice therapy and insole therapy and patient does not smoke likes to be active   Review of Systems  All other systems reviewed and are negative.       Objective:  Physical Exam Vitals and nursing note reviewed.  Constitutional:      Appearance: He is well-developed.  Pulmonary:     Effort: Pulmonary effort is normal.  Musculoskeletal:        General: Normal range of motion.  Skin:    General: Skin is warm.  Neurological:     Mental Status: He is alert.     Neurovascular status intact muscle strength found to be adequate range of motion adequate with patient found to have exquisite discomfort medial fascial band right at the insertional point tendon calcaneus with fluid buildup around the area.  Patient is found to have good digital perfusion well-oriented x 3     Assessment:  Acute plantar fasciitis right with inflammation fluid around the medial band     Plan:  H&P x-ray reviewed sterile prep injected the fascia at insertion 3 mg Kenalog 5 mg Xylocaine applied sterile dressing applied fascial brace to lift up the arch with instructions on usage instructed on physical therapy stretching exercises and reappoint to recheck as needed  X-rays indicate no signs of spur formation currently except small posterior spur

## 2023-12-28 DIAGNOSIS — L578 Other skin changes due to chronic exposure to nonionizing radiation: Secondary | ICD-10-CM | POA: Diagnosis not present

## 2023-12-28 DIAGNOSIS — H903 Sensorineural hearing loss, bilateral: Secondary | ICD-10-CM | POA: Diagnosis not present

## 2023-12-28 DIAGNOSIS — L814 Other melanin hyperpigmentation: Secondary | ICD-10-CM | POA: Diagnosis not present

## 2023-12-28 DIAGNOSIS — L738 Other specified follicular disorders: Secondary | ICD-10-CM | POA: Diagnosis not present

## 2023-12-28 DIAGNOSIS — L853 Xerosis cutis: Secondary | ICD-10-CM | POA: Diagnosis not present

## 2023-12-28 DIAGNOSIS — H6123 Impacted cerumen, bilateral: Secondary | ICD-10-CM | POA: Diagnosis not present

## 2023-12-28 DIAGNOSIS — L821 Other seborrheic keratosis: Secondary | ICD-10-CM | POA: Diagnosis not present

## 2023-12-28 DIAGNOSIS — D1801 Hemangioma of skin and subcutaneous tissue: Secondary | ICD-10-CM | POA: Diagnosis not present

## 2024-01-03 DIAGNOSIS — Z7901 Long term (current) use of anticoagulants: Secondary | ICD-10-CM | POA: Diagnosis not present

## 2024-01-14 DIAGNOSIS — Z7901 Long term (current) use of anticoagulants: Secondary | ICD-10-CM | POA: Diagnosis not present

## 2024-01-14 DIAGNOSIS — D6851 Activated protein C resistance: Secondary | ICD-10-CM | POA: Diagnosis not present

## 2024-01-15 DIAGNOSIS — Z7901 Long term (current) use of anticoagulants: Secondary | ICD-10-CM | POA: Diagnosis not present

## 2024-01-15 DIAGNOSIS — Z0000 Encounter for general adult medical examination without abnormal findings: Secondary | ICD-10-CM | POA: Diagnosis not present

## 2024-01-31 DIAGNOSIS — Z7901 Long term (current) use of anticoagulants: Secondary | ICD-10-CM | POA: Diagnosis not present

## 2024-01-31 DIAGNOSIS — D6851 Activated protein C resistance: Secondary | ICD-10-CM | POA: Diagnosis not present

## 2024-02-01 DIAGNOSIS — Z7901 Long term (current) use of anticoagulants: Secondary | ICD-10-CM | POA: Diagnosis not present

## 2024-02-22 DIAGNOSIS — Z7901 Long term (current) use of anticoagulants: Secondary | ICD-10-CM | POA: Diagnosis not present

## 2024-02-22 DIAGNOSIS — Z1331 Encounter for screening for depression: Secondary | ICD-10-CM | POA: Diagnosis not present

## 2024-02-22 DIAGNOSIS — Z0000 Encounter for general adult medical examination without abnormal findings: Secondary | ICD-10-CM | POA: Diagnosis not present

## 2024-02-22 DIAGNOSIS — Q984 Klinefelter syndrome, unspecified: Secondary | ICD-10-CM | POA: Diagnosis not present

## 2024-02-22 DIAGNOSIS — D6851 Activated protein C resistance: Secondary | ICD-10-CM | POA: Diagnosis not present

## 2024-02-22 DIAGNOSIS — E782 Mixed hyperlipidemia: Secondary | ICD-10-CM | POA: Diagnosis not present

## 2024-02-22 DIAGNOSIS — Z125 Encounter for screening for malignant neoplasm of prostate: Secondary | ICD-10-CM | POA: Diagnosis not present

## 2024-02-22 DIAGNOSIS — I82521 Chronic embolism and thrombosis of right iliac vein: Secondary | ICD-10-CM | POA: Diagnosis not present

## 2024-03-04 DIAGNOSIS — Q984 Klinefelter syndrome, unspecified: Secondary | ICD-10-CM | POA: Diagnosis not present

## 2024-03-04 DIAGNOSIS — Z7901 Long term (current) use of anticoagulants: Secondary | ICD-10-CM | POA: Diagnosis not present

## 2024-03-04 DIAGNOSIS — E291 Testicular hypofunction: Secondary | ICD-10-CM | POA: Diagnosis not present

## 2024-03-20 DIAGNOSIS — E291 Testicular hypofunction: Secondary | ICD-10-CM | POA: Diagnosis not present

## 2024-03-20 DIAGNOSIS — N529 Male erectile dysfunction, unspecified: Secondary | ICD-10-CM | POA: Diagnosis not present

## 2024-03-26 ENCOUNTER — Telehealth: Payer: Self-pay | Admitting: Internal Medicine

## 2024-03-26 NOTE — Telephone Encounter (Signed)
 Inbound call from patient, states the PPI he is currently on is increasing his Tinnitus. Patient states he would like to discuss other medications that are not PPI's with Dr. Avram as an acid reducer.

## 2024-03-26 NOTE — Telephone Encounter (Signed)
 Patient hasn't been seen since March 2023. We set him up an appointment to come in and discuss this. He needed a certain day so he was set up outside the POD. He does a lot of traveling. He didn't want to wait until September to see Dr Avram.

## 2024-03-27 DIAGNOSIS — H1045 Other chronic allergic conjunctivitis: Secondary | ICD-10-CM | POA: Diagnosis not present

## 2024-04-07 DIAGNOSIS — D6851 Activated protein C resistance: Secondary | ICD-10-CM | POA: Diagnosis not present

## 2024-04-07 DIAGNOSIS — Z7901 Long term (current) use of anticoagulants: Secondary | ICD-10-CM | POA: Diagnosis not present

## 2024-04-08 NOTE — Progress Notes (Unsigned)
 04/09/2024 Antonio [name] [id number] 1956/09/10  Referring provider: Epifanio Alm SQUIBB, MD Primary GI doctor: Dr. Avram  ASSESSMENT AND PLAN:  EOE with GERD status post dilation 2009 EGD for dysphagia suspicious for EOE narrowed lumen adult gastroscope disrupted mucosa in midesophagus 2009 barium swallow for discomfort after EGD showed diffusely small caliber esophagus without perforation Status post cholecystectomy with history of ERCP 2010 On pantoprazole  40 mg daily but has had tinnitus being evaluated by [facility] - will consider voquezna 10 mg daily but per patient request will discuss with pharm rep if there is an interaction with coumadin  or vitamin K -alginate therapy given -Lifestyle changes discussed, avoid NSAIDS, ETOH, hand out given to the patient - discussed dupixent, can consider as patient also has asthma, will discuss further with Dr. Avram - will check mag, B12, vitamin D due to long term PPI use  CRC screening CT colonoscopy March 2019 negative for polyps Recall 5 years No change in bowel habits, no melena no hematochezia Will schedule CT virtual colonoscopy as patient is unable to get off coumadin  due to factor V Leiden heterogeneous, very high risk for blood clots with multiple in the past  History of factor V Leiden heterogeneous with history of PE On chronic anticoagulation with warfarin Currently goal is 2.2-2.6  Patient Care Team: Epifanio Alm SQUIBB, MD as PCP - General (Infectious Diseases) Avram Lupita BRAVO, MD as Consulting Physician (Gastroenterology)  HISTORY OF PRESENT ILLNESS: 68 y.o. male with a past medical history listed below presents for evaluation of tinnitus while PPIs.   Last seen in the office 11/25/2021 by Dr. Avram for GERD with acidophil esophagitis  Discussed the use of AI scribe software for clinical note transcription with the patient, who gave verbal consent to proceed.  History of Present Illness   Antonio Antonio  Antonio [name] is a 68 year old male with GERD and eosinophilic esophagitis who presents with concerns about long-term PPI use and potential side effects.  He has been effectively managing GERD with Protonix  but is concerned about potential side effects, including tinnitus, magnesium depletion, and kidney issues. He is considering alternative medications like Voquezna, though he is aware of its high cost and potential bleeding-related side effects. No new symptoms related to GERD or eosinophilic esophagitis are present, aside from known swallowing difficulties and esophageal stricturing.  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 He previously underwent a CT colonoscopy in 2019 due to these risks. His INR is maintained between 2.2 and 2.6, and he has had issues with Xarelto  in the past, which led to pulmonary embolisms.  He is concerned about the long-term effects of PPIs, including potential kidney issues and vitamin deficiencies. He takes a multivitamin and magnesium supplement and is open to having his magnesium and B12 levels checked. He consumes Lactaid with a protein concoction for breakfast.  He recalls a virtual colonoscopy five years ago and is considering scheduling another one, preferring to avoid procedures that require sedation due to his bleeding risk.        He  reports that he has never smoked. He has been exposed to tobacco smoke. He has never used smokeless tobacco. He reports that he does not drink alcohol and does not use drugs.  RELEVANT GI HISTORY, IMAGING AND LABS: Results   RADIOLOGY CT virtual colonoscopy: Negative for  polyps; polyps less than 5 mm cannot be detected (11/2017)      CBC    Component Value Date/Time   WBC 4.9 01/06/2023 0427   RBC 4.86 01/06/2023 0427   HGB 14.8  01/06/2023 0427   HGB 15.1 04/13/2014 0832   HGB 15.7 02/10/2014 0755   HCT 42.9 01/06/2023 0427   HCT 42.4 04/13/2014 0832   HCT 44.6 02/10/2014 0755   PLT 175 01/06/2023 0427   PLT 163 04/13/2014 0832   PLT 184 02/10/2014 0755   MCV 88.3 01/06/2023 0427   MCV 86.5 02/23/2015 2004   MCV 86 04/13/2014 0832   MCV 85.6 02/10/2014 0755   MCH 30.5 01/06/2023 0427   MCHC 34.5 01/06/2023 0427   RDW 13.0 01/06/2023 0427   RDW 13.0 04/13/2014 0832   RDW 14.1 02/10/2014 0755   LYMPHSABS 1.6 01/15/2015 1052   LYMPHSABS 1.6 04/13/2014 0832   LYMPHSABS 1.4 02/10/2014 0755   MONOABS 0.6 01/15/2015 1052   MONOABS 0.4 02/10/2014 0755   EOSABS 0.2 01/15/2015 1052   EOSABS 0.3 04/13/2014 0832   BASOSABS 0.1 01/15/2015 1052   BASOSABS 0.0 04/13/2014 0832   BASOSABS 0.0 02/10/2014 0755   No results for input(s): HGB in the last 8760 hours.  CMP     Component Value Date/Time   NA 139 01/06/2023 0427   K 3.7 01/06/2023 0427   CL 104 01/06/2023 0427   CO2 27 01/06/2023 0427   GLUCOSE 98 01/06/2023 0427   BUN 16 01/06/2023 0427   CREATININE 1.02 01/06/2023 0427   CREATININE 1.10 02/23/2015 2004   CALCIUM  8.7 (L) 01/06/2023 0427   PROT 6.4 02/23/2015 2004   ALBUMIN 4.1 02/23/2015 2004   AST 21 02/23/2015 2004   ALT 25 05/07/2015 1503   ALKPHOS 46 02/23/2015 2004   BILITOT 1.0 02/23/2015 2004   GFRNONAA >60 01/06/2023 0427   GFRNONAA 74 01/15/2015 1052   GFRAA 86 01/15/2015 1052      Latest Ref Rng & Units 05/07/2015    3:03 PM 02/23/2015    8:04 PM 02/08/2015    4:40 PM  Hepatic Function  Total Protein 6.0 - 8.3 g/dL  6.4    Albumin 3.5 - 5.2 g/dL  4.1    AST 0 - 37 U/L  21    ALT 0 - 53 U/L 25  24    Alk Phosphatase 39 - 117 U/L  46    Total Bilirubin 0.2 - 1.2 mg/dL  1.0  1.1   Bilirubin, Direct 0.0 - 0.3 mg/dL   0.2       Current Medications:    Current Outpatient Medications (Cardiovascular):    tadalafil (CIALIS) 5 MG tablet, Take 5 mg by mouth daily.  Current  Outpatient Medications (Respiratory):    montelukast  (SINGULAIR ) 10 MG tablet, TAKE 1 TABLET BY MOUTH ONCE DAILY.   PROAIR  HFA 108 (90 BASE) MCG/ACT inhaler, USE 1 TO 2 PUFFS EVERY FOUR TO SIX HOURS AS NEEDED.   Current Outpatient Medications (Hematological):    warfarin (COUMADIN ) 5 MG tablet, TAKE 1 TABLET BY MOUTH DAILY.  Current Outpatient Medications (Other):    Multiple Vitamins-Minerals (CENTRUM SILVER PO), Take 1 tablet by mouth daily.   NONFORMULARY OR COMPOUNDED ITEM, Apply 1 application topically daily. Testosterone  Gel 10% Gel - Get's at [facility]   pantoprazole  (PROTONIX ) 40 MG tablet, TAKE 1 TABLET BY MOUTH ONCE DAILY.  Medical History:  Past Medical History:  Diagnosis Date   Allergy  Asthma    Cataract    Cervical spine degeneration    Chronic headaches    Clotting disorder (HCC) 01/16/2013   Eosinophilic esophagitis    GERD (gastroesophageal reflux disease)    Hyperlipidemia    Klinefelter syndrome    Panic attacks    Pulmonary embolism (HCC) 02/16/2013   Seasonal allergies    TIA (transient ischemic attack) 05/19/2013   Allergies:  Allergies  Allergen Reactions   Codeine Shortness Of Breath, Nausea Only, Other (See Comments), Anaphylaxis and Nausea And Vomiting    REACTION: difficulty breathing, nausea Other reaction(s): Nausea Only, Other (See Comments), Shortness Of Breath REACTION: difficulty breathing, nausea REACTION: difficulty breathing, nausea Other reaction(s): Nausea Only, Other (See Comments), Shortness Of Breath REACTION: difficulty breathing, nausea      Surgical History:  He  has a past surgical history that includes Cholecystectomy; Tonsillectomy; cataract surgery; Hand tendon surgery; ERCP; Colonoscopy; and Upper gastrointestinal endoscopy. Family History:  His family history includes Allergies in his mother and sister; Irritable bowel syndrome in his sister; Thyroid  disease in his mother.  REVIEW OF SYSTEMS  : All other  systems reviewed and negative except where noted in the History of Present Illness.  PHYSICAL EXAM: BP 100/60   Pulse 82   Ht 5' 8 (1.727 m)   Wt 180 lb 3.2 oz (81.7 kg)   BMI 27.40 kg/m  Physical Exam   GENERAL APPEARANCE: Well nourished, in no apparent distress HEENT: No cervical lymphadenopathy, unremarkable thyroid , sclerae anicteric, conjunctiva pink RESPIRATORY: Respiratory effort normal, BS equal bilateral without rales, rhonchi, wheezing CARDIO: RRR with no MRGs, peripheral pulses intact ABDOMEN: Soft, non distended, active bowel sounds in all 4 quadrants, no tenderness to palpation, no rebound, no mass appreciated RECTAL: declines MUSCULOSKELETAL: Full ROM, normal gait, without edema SKIN: Dry, intact without rashes or lesions. No jaundice. NEURO: Alert, oriented, no focal deficits PSYCH: Cooperative, normal mood and affect.      Alan JONELLE Coombs, PA-C 10:46 AM    Okay GI Attending  I agree with the Advanced Practitioner's note, impression and recommendations with the following additions:  Patient is very well-known to me.  I called him.  I think his fears of PPI are misplaced given that those are weak associations and not proving causality and I was able to reassure him that he would continue his pantoprazole .  Furthermore pantoprazole  is known to help with EOE by reducing chemotaxis  factors for eosinophils and I am not aware that Voquenza would do the same. As far as Dupixent - he is doing well w/o dysphagia and asthma is controlled so would not pursue that, either. Plus unlikely to be affordable.  Lupita CHARLENA Commander, MD, NOLIA

## 2024-04-09 ENCOUNTER — Telehealth: Payer: Self-pay

## 2024-04-09 ENCOUNTER — Telehealth: Payer: Self-pay | Admitting: *Deleted

## 2024-04-09 ENCOUNTER — Ambulatory Visit: Admitting: Physician Assistant

## 2024-04-09 ENCOUNTER — Encounter: Payer: Self-pay | Admitting: Physician Assistant

## 2024-04-09 VITALS — BP 100/60 | HR 82 | Ht 68.0 in | Wt 180.2 lb

## 2024-04-09 DIAGNOSIS — Z1211 Encounter for screening for malignant neoplasm of colon: Secondary | ICD-10-CM

## 2024-04-09 DIAGNOSIS — Z86711 Personal history of pulmonary embolism: Secondary | ICD-10-CM

## 2024-04-09 DIAGNOSIS — Z7901 Long term (current) use of anticoagulants: Secondary | ICD-10-CM

## 2024-04-09 DIAGNOSIS — Z862 Personal history of diseases of the blood and blood-forming organs and certain disorders involving the immune mechanism: Secondary | ICD-10-CM

## 2024-04-09 DIAGNOSIS — E538 Deficiency of other specified B group vitamins: Secondary | ICD-10-CM

## 2024-04-09 DIAGNOSIS — K219 Gastro-esophageal reflux disease without esophagitis: Secondary | ICD-10-CM | POA: Diagnosis not present

## 2024-04-09 DIAGNOSIS — K200 Eosinophilic esophagitis: Secondary | ICD-10-CM

## 2024-04-09 DIAGNOSIS — E559 Vitamin D deficiency, unspecified: Secondary | ICD-10-CM

## 2024-04-09 DIAGNOSIS — D6859 Other primary thrombophilia: Secondary | ICD-10-CM

## 2024-04-09 NOTE — Telephone Encounter (Signed)
-----   Message from Alan JONELLE Coombs sent at 04/09/2024 10:46 AM EDT ----- [name], can we contact the voquezna pharmacy people and make sure there is not a contraindication with couamdin or will affect vitamin K absorption with patient with Factor V leiden

## 2024-04-09 NOTE — Patient Instructions (Addendum)
 Please come back to the basement of our building and get labs that are in epic.  You do not need an appointment.  Our office is located at [facility] office at [address] [place], [place] [address].  You can come anytime to the basement of our building  Monday through friday between the hours of 7:30 am and 4:00 pm to have labs drawn.    Reflux Gourmet Rescue  It is an ALGINATE THERAPY which is the only intervention that works to safeguard the esophagus by creating a protective barrier that actually stops reflux from happening.  -The general directions for use are as stated on the packaging: Take 1 teaspoon (5 ml), or more as needed or as directed by your physician, after meals and before bed.  -These general directions address the most common times for reflux to occur, but our Rescue products may be taken anytime. Some individuals may take our product preemptively, when they know they will suffer from reflux, or as needed - when discomfort arises. (If taken around food, it should be consumed last.)  -You do not have to take 1 teaspoon (5 ml) of the product. While one teaspoon (5ml) may be the perfect average amount to relieve reflux suffering in some, others may require more or less. You may adjust the amount of Mint Chocolate Rescue and Vanilla Caramel Rescue to the lowest amount necessary to meet your individual needs to improve your quality of life.  -You may dilute the product if it is too viscous for you to consume. Keep in mind, however, that the thickness of the product was formulated to provide optimal coating and protection of your throat and esophagus. Though diluting the product is possible, it may reduce the protective function and/or length of action.  -This can be used in conjunction with reflux medications and lifestyle changes.  100% ALL-NATURAL  Paraben FREE, glycerin FREE, & potassium FREE  Made entirely from all-natural ingredients considered safe for children  and during pregnancy  No known side effects  All-natural flavor Gluten FREE  Allergen FREE  Vegan  Can find more information here: NameSeizer.co.nz   Dupixent (dupilumab) therapy approved for Eosinophilic esophagitis  (Should not receive a live vaccine, planning on becoming pregnant)   -300 mg weekly SQ injectable that is a biologic.   -In clinical trials to pick sent reduced eosinophils in the esophagus, and help decrease symptoms.  -The most common side effects or injection site reactions, upper respiratory infections, cold sores and joint pains.  -Please notify us  right away if you have an allergic reaction to Dupixent such as breathing problems, wheezing, swelling of your lips mouth face, tongue, hives, general ill feeling.  Please notify us  if you have trouble walking or moving your joints, very rare cases I need to have hospitalization.  -Would suggest getting flu vaccinations yearly, do not get any live vaccinations.  I encourage you to go to sites below or sign up for Dupixent my way  -You will receive a welcome call from a nurse educator who will help share resources and tools.  You can get a copay card from online or if you enroll in dupixent my way.   1) dupixent.com   2) call 184 for Dupixen  Once pre-certed through your insurance [facility] will be giving you a call to schedule your CT Virtual Colonoscopy   Due to recent changes in healthcare laws, you may see the results of your imaging and laboratory studies on MyChart before  your provider has had a chance to review them.  We understand that in some cases there may be results that are confusing or concerning to you. Not all laboratory results come back in the same time frame and the provider may be waiting for multiple results in order to interpret others.  Please give us  48 hours in order for your provider to thoroughly review all the results before contacting the office  for clarification of your results.    I appreciate the  opportunity to care for you  Thank You   [facility]

## 2024-04-09 NOTE — Telephone Encounter (Signed)
 [employer] P[employer] Cvg status: E-Verified Subscriber: [name],Antonio W Last verified date: 04/02/2024 Subscriber ID: [id number] Group #:  NO AUTH IS REQUIRED  AM HE IS GOOD TO HAVE IT

## 2024-04-09 NOTE — Telephone Encounter (Signed)
 I called [facility] to schedule the CT Virtual Colonoscopy but the order has the location [facility] ([place]), The order needs to be changed to [facility] or [facility] does not schedule them

## 2024-04-10 NOTE — Telephone Encounter (Signed)
 Scheduled patient for virtual Colonoscopy with [facility] on 05/01/2024 at 10 am  When I spoke to the patient he did not want this scheduled until Jan 2026. He is going to call [facility] back and reschedule

## 2024-04-10 NOTE — Telephone Encounter (Signed)
 Called [employer] medical information department [phone]). I spoke with representative Rock who informed me that she will have to send a triage message to the medical science liaison (MSL). Once message has been reviewed, we will be contacted with information. I provided my direct office number for return call. Will await call.

## 2024-04-10 NOTE — Addendum Note (Signed)
 Addended by: CRAIG PALMA on: 04/10/2024 11:45 AM   Modules accepted: Orders

## 2024-05-01 ENCOUNTER — Ambulatory Visit

## 2024-05-07 DIAGNOSIS — L821 Other seborrheic keratosis: Secondary | ICD-10-CM | POA: Diagnosis not present

## 2024-05-16 DIAGNOSIS — D6851 Activated protein C resistance: Secondary | ICD-10-CM | POA: Diagnosis not present

## 2024-06-12 ENCOUNTER — Emergency Department (HOSPITAL_COMMUNITY)

## 2024-06-12 ENCOUNTER — Emergency Department (HOSPITAL_COMMUNITY)
Admission: EM | Admit: 2024-06-12 | Discharge: 2024-06-12 | Disposition: A | Discharge status: Home / Self Care | Attending: Emergency Medicine | Admitting: Emergency Medicine

## 2024-06-12 ENCOUNTER — Other Ambulatory Visit: Payer: Self-pay

## 2024-06-12 ENCOUNTER — Encounter (HOSPITAL_COMMUNITY): Payer: Self-pay | Admitting: Emergency Medicine

## 2024-06-12 DIAGNOSIS — R079 Chest pain, unspecified: Secondary | ICD-10-CM | POA: Diagnosis not present

## 2024-06-12 DIAGNOSIS — J9811 Atelectasis: Secondary | ICD-10-CM | POA: Diagnosis not present

## 2024-06-12 DIAGNOSIS — J45909 Unspecified asthma, uncomplicated: Secondary | ICD-10-CM | POA: Insufficient documentation

## 2024-06-12 DIAGNOSIS — R0789 Other chest pain: Secondary | ICD-10-CM | POA: Diagnosis not present

## 2024-06-12 DIAGNOSIS — R911 Solitary pulmonary nodule: Secondary | ICD-10-CM | POA: Insufficient documentation

## 2024-06-12 DIAGNOSIS — Z8673 Personal history of transient ischemic attack (TIA), and cerebral infarction without residual deficits: Secondary | ICD-10-CM | POA: Diagnosis not present

## 2024-06-12 LAB — PROTIME-INR
INR: 2.1 — ABNORMAL HIGH (ref 0.8–1.2)
Prothrombin Time: 24.7 s — ABNORMAL HIGH (ref 11.4–15.2)

## 2024-06-12 LAB — CBC
HCT: 47.3 % (ref 39.0–52.0)
Hemoglobin: 16.7 g/dL (ref 13.0–17.0)
MCH: 31.2 pg (ref 26.0–34.0)
MCHC: 35.3 g/dL (ref 30.0–36.0)
MCV: 88.2 fL (ref 80.0–100.0)
Platelets: 176 K/uL (ref 150–400)
RBC: 5.36 MIL/uL (ref 4.22–5.81)
RDW: 13.2 % (ref 11.5–15.5)
WBC: 4.6 K/uL (ref 4.0–10.5)
nRBC: 0 % (ref 0.0–0.2)

## 2024-06-12 LAB — BASIC METABOLIC PANEL WITH GFR
Anion gap: 14 (ref 5–15)
BUN: 14 mg/dL (ref 8–23)
CO2: 25 mmol/L (ref 22–32)
Calcium: 9.1 mg/dL (ref 8.9–10.3)
Chloride: 102 mmol/L (ref 98–111)
Creatinine, Ser: 1.16 mg/dL (ref 0.61–1.24)
GFR, Estimated: >60 mL/min (ref >60)
Glucose, Bld: 106 mg/dL — ABNORMAL HIGH (ref 70–99)
Potassium: 4.4 mmol/L (ref 3.5–5.1)
Sodium: 141 mmol/L (ref 135–145)

## 2024-06-12 LAB — TROPONIN I (HIGH SENSITIVITY)
Troponin I (High Sensitivity): 2 ng/L (ref <18)
Troponin I (High Sensitivity): 2 ng/L (ref <18)

## 2024-06-12 MED ORDER — NITROGLYCERIN 0.4 MG SL SUBL
0.4000 mg | SUBLINGUAL_TABLET | SUBLINGUAL | Status: DC | PRN
  Administered 2024-06-12: 0.4 mg via SUBLINGUAL
  Filled 2024-06-12: qty 1

## 2024-06-12 MED ORDER — IOHEXOL 350 MG/ML SOLN
75.0000 mL | Freq: Once | INTRAVENOUS | Status: AC | PRN
  Administered 2024-06-12: 75 mL via INTRAVENOUS

## 2024-06-12 MED ORDER — ASPIRIN 81 MG PO CHEW
324.0000 mg | CHEWABLE_TABLET | Freq: Once | ORAL | Status: AC
  Administered 2024-06-12: 324 mg via ORAL
  Filled 2024-06-12: qty 4

## 2024-06-12 NOTE — ED Notes (Signed)
 Pt alert, NAD, calm, interactive. Lab at [facility].

## 2024-06-12 NOTE — ED Notes (Signed)
 Crackers, apple sauce and coke provided to patient.

## 2024-06-12 NOTE — ED Notes (Signed)
 Pt states he is still having left sided chest pressure but has refused any pain mediation at this time.

## 2024-06-12 NOTE — ED Triage Notes (Signed)
 Pt c/o of chest pain that radiates down left arm. Started @ 0700 this am.

## 2024-06-12 NOTE — ED Notes (Signed)
 Pt given a [place] sandwich and made aware we are still waiting on CT results to come back. Pt verbalized understanding.

## 2024-06-12 NOTE — ED Notes (Signed)
 Patient transported to X-ray

## 2024-06-12 NOTE — ED Provider Notes (Signed)
 [place] EMERGENCY DEPARTMENT AT [facility] Provider Note   CSN: [id number] Arrival date & time: 06/12/24  9164     Patient presents with: Chest Pain   Antonio [name] is a 68 y.o. male with a history significant for hyperlipidemia, asthma, GERD, history of pulmonary embolism in association with factor V deficiency on Coumadin , and history of TIA presenting for evaluation of left-sided chest pressure which started around 7 AM while patient was at rest.  The discomfort radiates into his left arm and has been constant.  He denies shortness of breath, also denies nausea vomiting, abdominal pain, palpitations.  He initially thought his symptoms may have been a GERD flare but takes Protonix  daily, when symptoms radiated into his left arm he decided he needed to be evaluated.  Has had no treatment prior to arrival.   The history is provided by the patient.       Prior to Admission medications   Medication Sig Start Date End Date Taking? Authorizing Provider  montelukast  (SINGULAIR ) 10 MG tablet TAKE 1 TABLET BY MOUTH ONCE DAILY. 11/27/16  Yes Avram Lupita BRAVO, MD  Multiple Vitamins-Minerals (CENTRUM SILVER PO) Take 1 tablet by mouth daily. 02/24/13  Yes [provider]  NONFORMULARY OR COMPOUNDED ITEM Apply 1 application topically daily. Testosterone  Gel 10% Gel - Get's at [facility]   Yes [provider]  pantoprazole  (PROTONIX ) 40 MG tablet TAKE 1 TABLET BY MOUTH ONCE DAILY. 12/01/19  Yes Avram Lupita BRAVO, MD  PROAIR  HFA 108 (90 BASE) MCG/ACT inhaler USE 1 TO 2 PUFFS EVERY FOUR TO SIX HOURS AS NEEDED. 03/11/15  Yes Le, Thao P, DO  tadalafil (CIALIS) 5 MG tablet Take 5 mg by mouth daily. 11/07/21  Yes [provider]  Triamcinolone  Acetonide (NASACORT  ALLERGY 24HR NA) Place 1 spray into the nose daily at 6 (six) AM.   Yes [provider]  warfarin (COUMADIN ) 5 MG tablet TAKE 1 TABLET BY MOUTH DAILY. Patient taking differently: Take 5 mg by mouth  See admin instructions. 5 mg on Monday and Thursday all other days take 2.5 tablet daily 04/30/19  Yes Magrinat, Sandria BROCKS, MD    Allergies: Codeine    Review of Systems  Constitutional:  Negative for fever.  HENT:  Negative for congestion and sore throat.   Eyes: Negative.   Respiratory:  Negative for chest tightness and shortness of breath.   Cardiovascular:  Positive for chest pain. Negative for palpitations and leg swelling.  Gastrointestinal:  Negative for abdominal pain, nausea and vomiting.  Genitourinary: Negative.   Musculoskeletal:  Negative for arthralgias, joint swelling and neck pain.  Skin: Negative.  Negative for rash and wound.  Neurological:  Negative for dizziness, weakness, light-headedness, numbness and headaches.  Psychiatric/Behavioral: Negative.      Updated Vital Signs BP 125/69   Pulse 77   Temp 98.3 F (36.8 C) (Oral)   Resp 17   Ht 5' 8 (1.727 m)   Wt 81.2 kg   SpO2 96%   BMI 27.22 kg/m   Physical Exam Vitals and nursing note reviewed.  Constitutional:      Appearance: He is well-developed.  HENT:     Head: Normocephalic and atraumatic.  Eyes:     Conjunctiva/sclera: Conjunctivae normal.  Cardiovascular:     Rate and Rhythm: Normal rate and regular rhythm.     Heart sounds: Normal heart sounds.  Pulmonary:     Effort: Pulmonary effort is normal. No respiratory distress.  Breath sounds: Normal breath sounds. No wheezing.  Abdominal:     General: Bowel sounds are normal.     Palpations: Abdomen is soft.     Tenderness: There is no abdominal tenderness.  Musculoskeletal:        General: Normal range of motion.     Cervical back: Normal range of motion.     Right lower leg: No edema.     Left lower leg: No edema.  Skin:    General: Skin is warm and dry.  Neurological:     Mental Status: He is alert.     (all labs ordered are listed, but only abnormal results are displayed) Labs Reviewed  BASIC METABOLIC PANEL WITH GFR - Abnormal;  Notable for the following components:      Result Value   Glucose, Bld 106 (*)    All other components within normal limits  PROTIME-INR - Abnormal; Notable for the following components:   Prothrombin Time 24.7 (*)    INR 2.1 (*)    All other components within normal limits  CBC  TROPONIN I (HIGH SENSITIVITY)  TROPONIN I (HIGH SENSITIVITY)    EKG: EKG Interpretation Date/Time:  Thursday June 12 2024 08:45:27 EDT Ventricular Rate:  74 PR Interval:  146 QRS Duration:  84 QT Interval:  370 QTC Calculation: 411 R Axis:   32  Text Interpretation: Sinus rhythm Confirmed by Patsey Lot [id number]) on 06/12/2024 8:56:04 AM  Radiology: CT Angio Chest PE W and/or Wo Contrast Result Date: 06/12/2024 CLINICAL DATA:  Pulmonary embolism (PE) suspected, high prob EXAM: CT ANGIOGRAPHY CHEST WITH CONTRAST TECHNIQUE: Multidetector CT imaging of the chest was performed using the standard protocol during bolus administration of intravenous contrast. Multiplanar CT image reconstructions and MIPs were obtained to evaluate the vascular anatomy. RADIATION DOSE REDUCTION: This exam was performed according to the departmental dose-optimization program which includes automated exposure control, adjustment of the mA and/or kV according to patient size and/or use of iterative reconstruction technique. CONTRAST:  75mL OMNIPAQUE  IOHEXOL  350 MG/ML SOLN COMPARISON:  06/12/2024, 02/23/2023 FINDINGS: Pulmonary Embolism: No pulmonary embolism. Cardiovascular: No cardiomegaly or pericardial effusion.No aortic aneurysm. Mediastinum/Nodes: No mediastinal mass.No mediastinal, hilar, or axillary lymphadenopathy. Lungs/Pleura: The midline trachea and bronchi are patent. No focal airspace consolidation, pleural effusion, or pneumothorax. Posterior bibasilar dependent atelectasis. 3 mm subpleural nodule in the lateral left lower lobe (axial 88). Musculoskeletal: No acute fracture or destructive bone lesion. Multilevel  degenerative disc disease of the spine. Thoracic DISH. Small volume symmetric bilateral gynecomastia. Upper Abdomen: No acute abnormality in the partially visualized upper abdomen. Review of the MIP images confirms the above findings. IMPRESSION: 1. No acute intrathoracic abnormality; specifically, no pulmonary embolism, pneumonia, or pleural effusion. 2. In the lateral left lower lobe, there is a small 3 mm subpleural nodule (axial 88). Follow-up should be considered, as documented below. Incidental single solid nodule <6 mm: Low risk: No routine follow-up. High risk: Optional CT at 12 months. Certain patients at high risk with suspicious nodule morphology, upper lobe location, or both may warrant 71-month follow-up. NOTE: These recommendations do not apply to patients with immunosuppression or patients with known primary cancer. REFERENCE: Fleischner Society 2017 Guidelines for Management of Incidentally Detected Pulmonary Nodules in Adults. Radiology 2017. Electronically Signed   By: Rogelia Myers M.D.   On: 06/12/2024 15:18     Procedures   Medications Ordered in the ED  aspirin  chewable tablet 324 mg (324 mg Oral Given 06/12/24 0947)  iohexol  (OMNIPAQUE ) 350 MG/ML  injection 75 mL (75 mLs Intravenous Contrast Given 06/12/24 1414)                                    Medical Decision Making Patient presenting with pressure in his left chest radiating into his left arm since 7 AM while he was at rest.  Not associated with shortness of breath, palpitations, nausea or vomiting.  Compliant with his Coumadin .  He also has a history of GERD but is on PPI  and feels his GERD is very well-controlled without breakthrough symptoms.  Differential diagnosis including ACS, he has a heart score of 3, new PE although endorses compliance with his Coumadin , musculoskeletal chest wall pain, GERD.  Labs and imaging EKG and exam are reassuring today.  Amount and/or Complexity of Data Reviewed Labs: ordered.     Details: Labs reviewed and reassuring he has a negative delta troponin, his Bement is unremarkable, he has an INR of 2.1, his CBC is normal, hemoglobin is 16.7 Radiology: ordered.    Details: Chest x-ray is clear, CT angio was also completed given his history to rule out PE and this is negative as well.  He has a small 3 mm subpleural nodule in his left lower lobe of unclear etiology.  He does not have a smoking history but does have a secondhand smoke history.  Patient was encouraged to discuss this finding with his primary provider who can help him decide if he would need a repeat CT scan in 1 year. ECG/medicine tests: ordered.    Details: Normal sinus rhythm, rate 74.  Risk OTC drugs.        Final diagnoses:  Nonspecific chest pain  Lung nodule seen on imaging study    ED Discharge Orders     None          Birdena Mliss RIGGERS 06/14/24 1332    Patsey Lot, MD 06/21/24 1435

## 2024-06-12 NOTE — ED Notes (Signed)
 Patient transported to CT

## 2024-06-12 NOTE — Discharge Instructions (Signed)
 As discussed,  your labs and imaging studies today are reassuring with no sign of your symptoms being from a cardiac or lung source.  However,  I do recommend followup with your primary MD for a recheck of your symptoms as well as having a discussion regarding the lung nodule found on your CT scan. He may agree you need a repeat CT scan in 12 months and can arrange this for you.

## 2024-07-23 DIAGNOSIS — Z7901 Long term (current) use of anticoagulants: Secondary | ICD-10-CM | POA: Diagnosis not present

## 2024-07-23 DIAGNOSIS — D6851 Activated protein C resistance: Secondary | ICD-10-CM | POA: Diagnosis not present

## 2024-08-14 ENCOUNTER — Encounter: Payer: Self-pay | Admitting: Podiatry

## 2024-08-14 ENCOUNTER — Encounter (INDEPENDENT_AMBULATORY_CARE_PROVIDER_SITE_OTHER): Payer: Self-pay

## 2024-08-20 MED ORDER — PANTOPRAZOLE SODIUM 40 MG PO TBEC: 40.0000 mg | DELAYED_RELEASE_TABLET | Freq: Every day | ORAL | 2 refills | Status: AC

## 2024-09-08 ENCOUNTER — Ambulatory Visit

## 2024-09-23 ENCOUNTER — Ambulatory Visit: Admitting: Internal Medicine

## 2024-10-13 ENCOUNTER — Ambulatory Visit

## 2024-10-20 ENCOUNTER — Other Ambulatory Visit: Payer: Self-pay

## 2024-11-21 ENCOUNTER — Ambulatory Visit: Admitting: Internal Medicine

## 2025-01-19 ENCOUNTER — Ambulatory Visit
